# Patient Record
Sex: Female | Born: 1955 | Race: White | Hispanic: No | Marital: Married | Smoking: Never smoker
Health system: Southern US, Community
[De-identification: ages and names within clinical notes are randomized; demographics above are authoritative.]

## PROBLEM LIST (undated history)

## (undated) DIAGNOSIS — IMO0001 Reserved for inherently not codable concepts without codable children: Secondary | ICD-10-CM

## (undated) DIAGNOSIS — I10 Essential (primary) hypertension: Secondary | ICD-10-CM

## (undated) DIAGNOSIS — I739 Peripheral vascular disease, unspecified: Secondary | ICD-10-CM

## (undated) DIAGNOSIS — Z8669 Personal history of other diseases of the nervous system and sense organs: Secondary | ICD-10-CM

## (undated) DIAGNOSIS — E876 Hypokalemia: Secondary | ICD-10-CM

## (undated) DIAGNOSIS — Z794 Long term (current) use of insulin: Secondary | ICD-10-CM

## (undated) DIAGNOSIS — E785 Hyperlipidemia, unspecified: Secondary | ICD-10-CM

## (undated) DIAGNOSIS — E119 Type 2 diabetes mellitus without complications: Secondary | ICD-10-CM

## (undated) DIAGNOSIS — E1110 Type 2 diabetes mellitus with ketoacidosis without coma: Secondary | ICD-10-CM

## (undated) DIAGNOSIS — I2510 Atherosclerotic heart disease of native coronary artery without angina pectoris: Secondary | ICD-10-CM

## (undated) HISTORY — DX: Hyperlipidemia, unspecified: E78.5

## (undated) HISTORY — DX: Atherosclerotic heart disease of native coronary artery without angina pectoris: I25.10

## (undated) HISTORY — PX: EYE SURGERY: SHX253

## (undated) HISTORY — DX: Essential (primary) hypertension: I10

---

## 2005-09-18 ENCOUNTER — Ambulatory Visit: Payer: Self-pay | Admitting: Ophthalmology

## 2009-05-31 ENCOUNTER — Inpatient Hospital Stay: Payer: Self-pay | Admitting: Internal Medicine

## 2009-11-11 ENCOUNTER — Ambulatory Visit: Payer: Self-pay

## 2009-12-08 ENCOUNTER — Ambulatory Visit: Payer: Self-pay

## 2010-01-08 ENCOUNTER — Ambulatory Visit: Payer: Self-pay

## 2014-07-26 ENCOUNTER — Observation Stay
Admission: EM | Admit: 2014-07-26 | Discharge: 2014-07-28 | Disposition: A | Discharge status: Home / Self Care | Payer: BLUE CROSS/BLUE SHIELD | Attending: Internal Medicine | Admitting: Internal Medicine

## 2014-07-26 ENCOUNTER — Emergency Department: Payer: BLUE CROSS/BLUE SHIELD

## 2014-07-26 DIAGNOSIS — R079 Chest pain, unspecified: Secondary | ICD-10-CM | POA: Diagnosis present

## 2014-07-26 DIAGNOSIS — D72829 Elevated white blood cell count, unspecified: Secondary | ICD-10-CM | POA: Diagnosis not present

## 2014-07-26 DIAGNOSIS — R072 Precordial pain: Secondary | ICD-10-CM | POA: Diagnosis not present

## 2014-07-26 DIAGNOSIS — I25119 Atherosclerotic heart disease of native coronary artery with unspecified angina pectoris: Secondary | ICD-10-CM | POA: Diagnosis present

## 2014-07-26 DIAGNOSIS — Z794 Long term (current) use of insulin: Secondary | ICD-10-CM | POA: Diagnosis not present

## 2014-07-26 DIAGNOSIS — I10 Essential (primary) hypertension: Secondary | ICD-10-CM | POA: Insufficient documentation

## 2014-07-26 DIAGNOSIS — Z8249 Family history of ischemic heart disease and other diseases of the circulatory system: Secondary | ICD-10-CM | POA: Diagnosis not present

## 2014-07-26 DIAGNOSIS — M549 Dorsalgia, unspecified: Secondary | ICD-10-CM | POA: Insufficient documentation

## 2014-07-26 DIAGNOSIS — Z7982 Long term (current) use of aspirin: Secondary | ICD-10-CM | POA: Insufficient documentation

## 2014-07-26 DIAGNOSIS — Z6841 Body Mass Index (BMI) 40.0 and over, adult: Secondary | ICD-10-CM | POA: Diagnosis not present

## 2014-07-26 DIAGNOSIS — Z79899 Other long term (current) drug therapy: Secondary | ICD-10-CM | POA: Diagnosis not present

## 2014-07-26 DIAGNOSIS — R0602 Shortness of breath: Secondary | ICD-10-CM | POA: Diagnosis not present

## 2014-07-26 DIAGNOSIS — Z880 Allergy status to penicillin: Secondary | ICD-10-CM | POA: Diagnosis not present

## 2014-07-26 DIAGNOSIS — E119 Type 2 diabetes mellitus without complications: Secondary | ICD-10-CM | POA: Insufficient documentation

## 2014-07-26 DIAGNOSIS — E876 Hypokalemia: Secondary | ICD-10-CM | POA: Insufficient documentation

## 2014-07-26 DIAGNOSIS — R0789 Other chest pain: Secondary | ICD-10-CM | POA: Insufficient documentation

## 2014-07-26 HISTORY — DX: Type 2 diabetes mellitus without complications: E11.9

## 2014-07-26 HISTORY — DX: Morbid (severe) obesity due to excess calories: E66.01

## 2014-07-26 HISTORY — DX: Type 2 diabetes mellitus with ketoacidosis without coma: E11.10

## 2014-07-26 HISTORY — DX: Essential (primary) hypertension: I10

## 2014-07-26 HISTORY — DX: Hypokalemia: E87.6

## 2014-07-26 HISTORY — DX: Reserved for inherently not codable concepts without codable children: IMO0001

## 2014-07-26 HISTORY — DX: Long term (current) use of insulin: Z79.4

## 2014-07-26 LAB — COMPREHENSIVE METABOLIC PANEL
ALBUMIN: 3.6 g/dL (ref 3.5–5.0)
ALT: 12 U/L — ABNORMAL LOW (ref 14–54)
AST: 17 U/L (ref 15–41)
Alkaline Phosphatase: 58 U/L (ref 38–126)
Anion gap: 11 (ref 5–15)
BUN: 27 mg/dL — AB (ref 6–20)
CO2: 26 mmol/L (ref 22–32)
CREATININE: 1.08 mg/dL — AB (ref 0.44–1.00)
Calcium: 9.2 mg/dL (ref 8.9–10.3)
Chloride: 101 mmol/L (ref 101–111)
GFR calc Af Amer: >60 mL/min (ref >60)
GFR calc non Af Amer: 54 mL/min — ABNORMAL LOW (ref >60)
GLUCOSE: 156 mg/dL — AB (ref 65–99)
POTASSIUM: 3.4 mmol/L — AB (ref 3.5–5.1)
Sodium: 138 mmol/L (ref 135–145)
Total Bilirubin: 0.3 mg/dL (ref 0.3–1.2)
Total Protein: 7.5 g/dL (ref 6.5–8.1)

## 2014-07-26 LAB — GLUCOSE, CAPILLARY
GLUCOSE-CAPILLARY: 142 mg/dL — AB (ref 65–99)
GLUCOSE-CAPILLARY: 154 mg/dL — AB (ref 65–99)
GLUCOSE-CAPILLARY: 172 mg/dL — AB (ref 65–99)

## 2014-07-26 LAB — TROPONIN I

## 2014-07-26 LAB — CBC
HCT: 38.4 % (ref 35.0–47.0)
Hemoglobin: 12.8 g/dL (ref 12.0–16.0)
MCH: 31.9 pg (ref 26.0–34.0)
MCHC: 33.4 g/dL (ref 32.0–36.0)
MCV: 95.5 fL (ref 80.0–100.0)
Platelets: 259 10*3/uL (ref 150–440)
RBC: 4.02 MIL/uL (ref 3.80–5.20)
RDW: 12.9 % (ref 11.5–14.5)
WBC: 11.3 10*3/uL — ABNORMAL HIGH (ref 3.6–11.0)

## 2014-07-26 LAB — FIBRIN DERIVATIVES D-DIMER (ARMC ONLY): Fibrin derivatives D-dimer (ARMC): 739.81 — ABNORMAL HIGH (ref 0–499)

## 2014-07-26 MED ORDER — HYDROCHLOROTHIAZIDE 25 MG PO TABS
25.0000 mg | ORAL_TABLET | Freq: Every day | ORAL | Status: DC
  Administered 2014-07-26 – 2014-07-28 (×3): 25 mg via ORAL
  Filled 2014-07-26 (×3): qty 1

## 2014-07-26 MED ORDER — ENOXAPARIN SODIUM 120 MG/0.8ML ~~LOC~~ SOLN
120.0000 mg | Freq: Once | SUBCUTANEOUS | Status: AC
  Administered 2014-07-26: 120 mg via SUBCUTANEOUS
  Filled 2014-07-26: qty 0.8

## 2014-07-26 MED ORDER — LISINOPRIL 20 MG PO TABS
20.0000 mg | ORAL_TABLET | Freq: Every day | ORAL | Status: DC
  Administered 2014-07-26 – 2014-07-28 (×3): 20 mg via ORAL
  Filled 2014-07-26 (×3): qty 1

## 2014-07-26 MED ORDER — ROSUVASTATIN CALCIUM 10 MG PO TABS
10.0000 mg | ORAL_TABLET | Freq: Every day | ORAL | Status: DC
  Administered 2014-07-26 – 2014-07-27 (×2): 10 mg via ORAL
  Filled 2014-07-26 (×2): qty 1

## 2014-07-26 MED ORDER — IOHEXOL 350 MG/ML SOLN
100.0000 mL | Freq: Once | INTRAVENOUS | Status: AC | PRN
  Administered 2014-07-26: 100 mL via INTRAVENOUS

## 2014-07-26 MED ORDER — INSULIN ASPART 100 UNIT/ML ~~LOC~~ SOLN
0.0000 [IU] | Freq: Three times a day (TID) | SUBCUTANEOUS | Status: DC
  Administered 2014-07-26 – 2014-07-28 (×5): 2 [IU] via SUBCUTANEOUS
  Filled 2014-07-26 (×5): qty 2

## 2014-07-26 MED ORDER — ENOXAPARIN SODIUM 40 MG/0.4ML ~~LOC~~ SOLN: 40.0000 mg | SUBCUTANEOUS | Status: DC

## 2014-07-26 MED ORDER — METOPROLOL TARTRATE 50 MG PO TABS
50.0000 mg | ORAL_TABLET | Freq: Two times a day (BID) | ORAL | Status: DC
  Administered 2014-07-26 – 2014-07-28 (×5): 50 mg via ORAL
  Filled 2014-07-26 (×5): qty 1

## 2014-07-26 MED ORDER — SODIUM CHLORIDE 0.9 % IJ SOLN
3.0000 mL | Freq: Two times a day (BID) | INTRAMUSCULAR | Status: DC
  Administered 2014-07-26 – 2014-07-27 (×3): 3 mL via INTRAVENOUS

## 2014-07-26 MED ORDER — ASPIRIN 81 MG PO CHEW
324.0000 mg | CHEWABLE_TABLET | Freq: Once | ORAL | Status: AC
  Administered 2014-07-26: 324 mg via ORAL
  Filled 2014-07-26: qty 4

## 2014-07-26 MED ORDER — SENNOSIDES-DOCUSATE SODIUM 8.6-50 MG PO TABS: 1.0000 | ORAL_TABLET | Freq: Every evening | ORAL | Status: DC | PRN

## 2014-07-26 MED ORDER — NITROGLYCERIN 2 % TD OINT
1.0000 [in_us] | TOPICAL_OINTMENT | Freq: Once | TRANSDERMAL | Status: AC
  Administered 2014-07-26: 1 [in_us] via TOPICAL
  Filled 2014-07-26: qty 1

## 2014-07-26 MED ORDER — POTASSIUM CHLORIDE CRYS ER 20 MEQ PO TBCR
40.0000 meq | EXTENDED_RELEASE_TABLET | Freq: Once | ORAL | Status: AC
  Administered 2014-07-26: 40 meq via ORAL
  Filled 2014-07-26: qty 2

## 2014-07-26 MED ORDER — INSULIN GLARGINE 100 UNIT/ML ~~LOC~~ SOLN
52.0000 [IU] | Freq: Every day | SUBCUTANEOUS | Status: DC
  Administered 2014-07-26 – 2014-07-27 (×2): 52 [IU] via SUBCUTANEOUS
  Filled 2014-07-26 (×3): qty 0.52

## 2014-07-26 MED ORDER — ACETAMINOPHEN 325 MG PO TABS
650.0000 mg | ORAL_TABLET | Freq: Four times a day (QID) | ORAL | Status: DC | PRN
  Administered 2014-07-27 – 2014-07-28 (×3): 650 mg via ORAL
  Filled 2014-07-26 (×3): qty 2

## 2014-07-26 MED ORDER — LISINOPRIL-HYDROCHLOROTHIAZIDE 20-25 MG PO TABS: 1.0000 | ORAL_TABLET | Freq: Every day | ORAL | Status: DC

## 2014-07-26 MED ORDER — ACETAMINOPHEN 650 MG RE SUPP: 650.0000 mg | Freq: Four times a day (QID) | RECTAL | Status: DC | PRN

## 2014-07-26 MED ORDER — ASPIRIN EC 81 MG PO TBEC
81.0000 mg | DELAYED_RELEASE_TABLET | Freq: Every day | ORAL | Status: DC
  Administered 2014-07-27 – 2014-07-28 (×2): 81 mg via ORAL
  Filled 2014-07-26 (×2): qty 1

## 2014-07-26 MED ORDER — INSULIN ASPART 100 UNIT/ML FLEXPEN: 10.0000 [IU] | PEN_INJECTOR | Freq: Three times a day (TID) | SUBCUTANEOUS | Status: DC

## 2014-07-26 MED ORDER — ENOXAPARIN SODIUM 40 MG/0.4ML ~~LOC~~ SOLN
40.0000 mg | Freq: Two times a day (BID) | SUBCUTANEOUS | Status: DC
  Administered 2014-07-27 – 2014-07-28 (×3): 40 mg via SUBCUTANEOUS
  Filled 2014-07-26 (×3): qty 0.4

## 2014-07-26 MED ORDER — NITROGLYCERIN 0.1 MG/HR TD PT24
0.1000 mg | MEDICATED_PATCH | Freq: Every day | TRANSDERMAL | Status: DC
  Administered 2014-07-26 – 2014-07-28 (×3): 0.1 mg via TRANSDERMAL
  Filled 2014-07-26 (×3): qty 1

## 2014-07-26 MED ORDER — POTASSIUM CHLORIDE CRYS ER 10 MEQ PO TBCR
10.0000 meq | EXTENDED_RELEASE_TABLET | Freq: Every day | ORAL | Status: DC
  Administered 2014-07-27 – 2014-07-28 (×2): 10 meq via ORAL
  Filled 2014-07-26 (×2): qty 1

## 2014-07-26 NOTE — Progress Notes (Signed)
Lovenox adjustment  Per hospital protocol patient's Lovenox dose was changed to  every 12 hours due to BMI >40 (51 kg)

## 2014-07-26 NOTE — Consult Note (Signed)
Cardiology Consultation Note  Patient ID: Laura Kline, MRN: [id number], DOB/AGE: Apr 06, 1955 59 y.o. Admit date: 07/26/2014   Date of Consult: 07/26/2014 Primary Physician: No PCP Per Patient Primary Cardiologist: No cardiologist per patient, new to [facility]  Chief Complaint: Chest pain and pain between shoulder blades Reason for Consult: Chest pain and CT that showed plaque in mid LAD  HPI: 59 y.o. female with h/o hypertension controlled on lisinopril-HCTZ, IDDM managed by Dr. Carlena Sax in the [facility], history DKA in 2011, and morbid obesity presents today with complaints of intermittent chest pain occuring at rest, pain between her shoulder blades, nausea, and shortness of breath.    She has no previously known cardiac history. According to the patient, she has never had a cardiologist and has never had a cardiology work-up (stress test, Echo, cardiac cath).  CT angiochest from today (7/18) is negative for PE and aortic dissection, however does show plaque in mid LAD, therefore cardiology was consulted. Troponin negative x 2.    The patient presented to the ED from home this morning (7/18) because heart disease runs on the paternal side of her family, and she was concerned about the above symptoms. Her father suffered an MI at age 56 followed by  4 vessel CABG.   On 7/17 while watching TV she developed chest pain with associated back pain.  She says the pain was not unbearable, but she could tell it was present. Pain was not sharp or dull, just a noticeable pain. She has had the same pain a few times since they began yesterday, including a few times while being at [facility], but the episodes don't last more than 5 minutes. She has not tried any medication to help with the pain, but she attempted to get in a more comfortable position when it began and said that did help some. She had not eaten anything recently before the pain began, she hasn't had any changes in her diet, and she has not done  any extra lifting or physical work. She denies pain in her arm, diaphoresis, dizziness, vomiting, or diarrhea.  She acknowledges leg swelling and SOB, but says this is nothing new for her.  At baseline, she cannot make more than 2 trips from one room to another without getting winded.  She does not have any pain in her legs, has not traveled recently, and has not been on any long car/train/plane rides.   CXR normal. WBC 11.3, SCr 1.08, K+ 3.4, D-dimer 739. CTA chest and troponin as above.    She is currently without chest pain. She has been scheduled for [facility] by IM. We are asked to evaluate for chest pain in the setting of atherosclerosis of LAD.   EKG shows normal sinus rhythm with 82 bpm, TWI V2, nonspecific st/tchanges III    Past Medical History  Diagnosis Date  . Hypertension   . Morbid obesity   . Hypokalemia   . IDDM (insulin dependent diabetes mellitus)   . Long-term insulin use   . DKA (diabetic ketoacidoses)     a. 05/2009      Most Recent Cardiac Studies: As above   Surgical History:  Past Surgical History  Procedure Laterality Date  . Cesarean section      x2     Home Meds: Prior to Admission medications   Medication Sig Start Date End Date Taking? Authorizing Provider  aspirin EC 81 MG tablet Take 81 mg by mouth daily.   Yes Historical Provider,  MD  insulin aspart (NOVOLOG FLEXPEN) 100 UNIT/ML FlexPen Inject 10-16 Units into the skin 3 (three) times daily with meals. Per sliding scale   Yes Historical Provider, MD  Insulin Glargine (LANTUS SOLOSTAR) 100 UNIT/ML Solostar Pen Inject 52 Units into the skin at bedtime.   Yes Historical Provider, MD  lisinopril-hydrochlorothiazide (PRINZIDE,ZESTORETIC) 20-25 MG per tablet Take 1 tablet by mouth daily.   Yes Historical Provider, MD  metFORMIN (GLUCOPHAGE-XR) 500 MG 24 hr tablet Take 2 tablets by mouth daily. With dinner   Yes Historical Provider, MD  metoprolol (LOPRESSOR) 50 MG tablet Take 1 tablet by mouth 2  (two) times daily.   Yes Historical Provider, MD    Inpatient Medications:  . aspirin EC  81 mg Oral Daily  . [START ON 07/27/2014] enoxaparin (LOVENOX) injection  40 mg Subcutaneous Q12H  . lisinopril  20 mg Oral Daily   And  . hydrochlorothiazide  25 mg Oral Daily  . insulin aspart  0-9 Units Subcutaneous TID WC  . insulin glargine  52 Units Subcutaneous QHS  . metoprolol  50 mg Oral BID  . nitroGLYCERIN  0.1 mg Transdermal Daily  . rosuvastatin  10 mg Oral QPC supper  . sodium chloride  3 mL Intravenous Q12H      Allergies:  Allergies  Allergen Reactions  . Penicillins Rash    History   Social History  . Marital Status: Married    Spouse Name: N/A  . Number of Children: N/A  . Years of Education: N/A   Occupational History  . Not on file.   Social History Main Topics  . Smoking status: Never Smoker   . Smokeless tobacco: Never Used  . Alcohol Use: No  . Drug Use: No  . Sexual Activity: Yes   Other Topics Concern  . Not on file   Social History Narrative  . No narrative on file     Family History  Problem Relation Age of Onset  . Hypertension Mother   . Hypertension Father   . CAD Father     s/p cabg  . Heart attack Father   . Heart failure Father      Review of Systems: Review of Systems  Constitutional: Negative for fever, chills, weight loss, malaise/fatigue and diaphoresis.  HENT: Negative for congestion.   Eyes: Negative for blurred vision, discharge and redness.  Respiratory: Positive for shortness of breath. Negative for cough and sputum production.        SOB is not new  Cardiovascular: Positive for chest pain and leg swelling. Negative for palpitations, orthopnea and PND.       Leg swelling is bilateral and is not new  Gastrointestinal: Positive for nausea. Negative for heartburn, vomiting, abdominal pain, diarrhea and constipation.  Musculoskeletal: Positive for back pain. Negative for falls and neck pain.       Pain between shoulder  blades occurs at same time as chest pain  Skin: Negative for rash.  Neurological: Negative for dizziness, focal weakness, seizures, loss of consciousness, weakness and headaches.  Psychiatric/Behavioral: The patient is not nervous/anxious.   All other systems reviewed and are negative.   Labs:  Recent Labs  07/26/14 0735 07/26/14 1013  TROPONINI <0.03 <0.03   Lab Results  Component Value Date   WBC 11.3* 07/26/2014   HGB 12.8 07/26/2014   HCT 38.4 07/26/2014   MCV 95.5 07/26/2014   PLT 259 07/26/2014     Recent Labs Lab 07/26/14 0735  NA 138  K 3.4*  CL 101  CO2 26  BUN 27*  CREATININE 1.08*  CALCIUM 9.2  PROT 7.5  BILITOT 0.3  ALKPHOS 58  ALT 12*  AST 17  GLUCOSE 156*   No results found for: CHOL, HDL, LDLCALC, TRIG DDIMER: 739  Radiology/Studies:  Dg Chest 2 View  07/26/2014   CLINICAL DATA:  Midsternal chest pain radiating into the mid back with intermittent shortness of breath since yesterday, nonsmoker, no known cardiopulmonary history, history of diabetes and hypertension.  EXAM: CHEST  2 VIEW  COMPARISON:  Portable chest x-ray of May 31, 2009  FINDINGS: The lungs are adequately inflated and clear. The heart and mediastinal structures are normal. There is no pleural effusion. The bony thorax is unremarkable.  IMPRESSION: There is no active cardiopulmonary disease.   Electronically Signed   By: David  [place] M.D.   On: 07/26/2014 08:06   Ct Angio Chest Pe W/cm &/or Wo Cm  07/26/2014   CLINICAL DATA:  Midsternal chest pain.  Back pain.  EXAM: CT ANGIOGRAPHY CHEST WITH CONTRAST  TECHNIQUE: Multidetector CT imaging of the chest was performed using the standard protocol during bolus administration of intravenous contrast. Multiplanar CT image reconstructions and MIPs were obtained to evaluate the vascular anatomy.  CONTRAST:  OMNIPAQUE IOHEXOL 350 MG/ML SOLN  COMPARISON:  05/31/2009  FINDINGS: THORACIC INLET/BODY WALL:  No acute abnormality.  MEDIASTINUM:   Normal heart size. No pericardial effusion. Extensive atherosclerotic calcification along the mid LAD. No evidence for acute vascular abnormality including aortic dissection or pulmonary embolism (note that intermittent motion degrades evaluation of subsegmental branches, but exam is overall diagnostic for detection of clinically significant pulmonary embolism). No adenopathy.  LUNG WINDOWS:  Mosaic attenuation the lungs consistent with air trapping. No consolidation. No effusion. No suspicious pulmonary nodule.  UPPER ABDOMEN:  No acute findings.  OSSEOUS:  Diffuse degenerative disc narrowing and endplate irregularity.  Review of the MIP images confirms the above findings.  IMPRESSION: 1. No evidence of pulmonary embolism or other acute disease. 2. Coronary atherosclerosis. 3. Patchy air trapping, usually from small airways disease.   Electronically Signed   By: Marnee Spring M.D.   On: 07/26/2014 10:00    EKG: normal sinus rhythm with 82 bpm, TWI V2, nonspecific st/tchanges III    Weights: Filed Weights   07/26/14 0725  Weight: 311 lb (141.069 kg)     Physical Exam: Blood pressure 142/61, pulse 81, temperature 98.1 F (36.7 C), temperature source Oral, resp. rate 20, height 5\' 4"  (1.626 m), weight 311 lb (141.069 kg), SpO2 98 %. Body mass index is 53.36 kg/(m^2). General: Obese, well developed, well nourished, in no acute distress, sitting up in bed until she put the bed back to relieve lower back pain Head: Normocephalic, atraumatic, sclera non-icteric, no xanthomas, nares are without discharge.  Neck: Negative for carotid bruits. JVD not elevated. Lungs: Clear bilaterally to auscultation without wheezes, rales, or rhonchi. Breathing is unlabored. Heart: RRR with S1 S2. I/VI systolic murmur LUSB. No rubs or gallops appreciated. Abdomen: Obese, soft, non-tender, non-distended with normoactive bowel sounds. No hepatomegaly. No rebound/guarding. No obvious abdominal masses. Msk:  Strength and  tone appear normal for age. Extremities: No clubbing or cyanosis. No edema.  Distal pedal pulses are 2+ and equal bilaterally. Neuro: Alert and oriented X 3. No facial asymmetry. No focal deficit. Moves all extremities spontaneously. Psych:  Responds to questions appropriately with a normal affect.    Assessment and Plan:  59 y.o. female with h/o hypertension controlled  on lisinopril-HCTZ, IDDM [facility], history DKA in 2011, and morbid obesity presents today with complaints of intermittent chest pain occuring at rest, pain between her shoulder blades, nausea, and shortness of breath.    1. Chest pain: Possibly atypical given symptoms occuring at rest while watching TV. [facility] to evaluate for high risk ischemia. Will need to be 2 day study. Unable to treadmill given her deconditioning. Must consider body habitus when choosing imaging study. Consider other etiologies such as musculoskeletal or GI secondary to her risk factors of diabetes mellitus or obesity and hypertension. CT angio chest with plaque in mid LAD, monitor her overnight on telemetry and continue aspirin, beta blocker, statin, nitrate, Lovenox. Lexiscan as above. Will await results.  2. Hypokalemia: likely d/t poor diet.  Will replete and monitor BMP on 7/19  3. Elevated d-dimer: CT of the chest negative for pulmonary emboli, lower extremity ultrasound ordered, monitored by IM  4. Elevated WBC count: likely stress-induced leukocytosis, no signs of infection   5.  IDDM: continued on home meds, monitored by IM  6. Hypertension: controlled, continue her lisinopril-HCTZ and metoprolol  7. Morbid obesity: Weight loss advised    Elinor DodgeSigned, Maley Venezia, PA-C Pager: [phone] 07/26/2014, 3:52 PM

## 2014-07-26 NOTE — Plan of Care (Signed)
Problem: Phase I Progression Outcomes Goal: Other Phase I Outcomes/Goals Outcome: Completed/Met Date Met:  07/26/14 Pt is alert and oriented x 4, history of diabetes and htn controlled with po medications, pt reports that she does not have a pcp but follows closely with endocrinologist who prescribes medications. Pt lives at home with husband and son. Reports significant family history of cardiac incidents but denies having personal history of cardiac events outside of hypertension. Pt came to ED after having chest pain since 7/17, reports that is did not worsen or improve with activity. Denies recent falls, is a moderate fall risk, will likely have stress test on 7/19.

## 2014-07-26 NOTE — ED Provider Notes (Signed)
[facility] Emergency Department Provider Note     Time seen: ----------------------------------------- 7:30 AM on 07/26/2014 -----------------------------------------    I have reviewed the triage vital signs and the nursing notes.   HISTORY  Chief Complaint Chest Pain    HPI Laura Kline is a 59 y.o. female resents ER with chest pain and back pain. Patient describes it is sharp, it does not radiate from the chest to the back, just hurts in both places at the same time. She's not had a history of same, doesn't history of hypertension diabetes. Denies fevers chills, heavy physical activity. Symptom last about 5 minutes and then resolves and his very intermittent.                                Past Medical History  Diagnosis Date  . Hypertension   . Diabetes mellitus without complication     There are no active problems to display for this patient.   Past Surgical History  Procedure Laterality Date  . Cesarean section      x2    Allergies Penicillins  Social History History  Substance Use Topics  . Smoking status: Never Smoker   . Smokeless tobacco: Never Used  . Alcohol Use: No    Review of Systems Constitutional: Negative for fever. Eyes: Negative for visual changes. ENT: Negative for sore throat. Cardiovascular: Positive for chest pain Respiratory: Negative for shortness of breath. Gastrointestinal: Negative for abdominal pain, vomiting and diarrhea. Genitourinary: Negative for dysuria. Musculoskeletal: Positive for upper back pain Skin: Negative for rash. Neurological: Negative for headaches, focal weakness or numbness.  10-point ROS otherwise negative.  ____________________________________________   PHYSICAL EXAM:  VITAL SIGNS: ED Triage Vitals  Enc Vitals Group     BP 07/26/14 0721 133/49 mmHg     Pulse Rate 07/26/14 0721 84     Resp 07/26/14 0721 18     Temp 07/26/14 0721 97.8 F (36.6 C)     Temp Source  07/26/14 0721 Oral     SpO2 07/26/14 0721 96 %     Weight 07/26/14 0725 311 lb (141.069 kg)     Height 07/26/14 0721 5\' 4"  (1.626 m)     Head Cir --      Peak Flow --      Pain Score 07/26/14 0722 0     Pain Loc --      Pain Edu? --      Excl. in GC? --    Constitutional: Alert and oriented. Well appearing and in no distress. Eyes: Conjunctivae are normal. PERRL. Normal extraocular movements. ENT   Head: Normocephalic and atraumatic.   Nose: No congestion/rhinnorhea.   Mouth/Throat: Mucous membranes are moist.   Neck: No stridor. Hematological/Lymphatic/Immunilogical: No cervical lymphadenopathy. Cardiovascular: Normal rate, regular rhythm. Normal and symmetric distal pulses are present in all extremities. No murmurs, rubs, or gallops. Respiratory: Normal respiratory effort without tachypnea nor retractions. Breath sounds are clear and equal bilaterally. No wheezes/rales/rhonchi. Gastrointestinal: Soft and nontender. No distention. No abdominal bruits. There is no CVA tenderness. Musculoskeletal: Nontender with normal range of motion in all extremities. No joint effusions.  No lower extremity tenderness nor edema. Neurologic:  Normal speech and language. No gross focal neurologic deficits are appreciated. Speech is normal. No gait instability. Skin:  Skin is warm, dry and intact. No rash noted. Psychiatric: Mood and affect are normal. Speech and behavior are normal. Patient exhibits appropriate  insight and judgment. ____________________________________________  EKG: Interpreted by me. Normal sinus rhythm with a rate of 82 bpm, low voltage QRS, normal axis. No evidence of acute infarction.  ____________________________________________  ED COURSE:  Pertinent labs & imaging results that were available during my care of the patient were reviewed by me and considered in my medical decision making (see chart for details). Patient with nonspecific chest and back pain, we'll  check cardiac labs and reevaluate. ____________________________________________    LABS (pertinent positives/negatives)  Labs Reviewed  CBC - Abnormal; Notable for the following:    WBC 11.3 (*)    All other components within normal limits  COMPREHENSIVE METABOLIC PANEL - Abnormal; Notable for the following:    Potassium 3.4 (*)    Glucose, Bld 156 (*)    BUN 27 (*)    Creatinine, Ser 1.08 (*)    ALT 12 (*)    GFR calc non Af Amer 54 (*)    All other components within normal limits  FIBRIN DERIVATIVES D-DIMER ([facility] ONLY) - Abnormal; Notable for the following:    Fibrin derivatives D-dimer ([facility]) 739.81 (*)    All other components within normal limits  TROPONIN I  TROPONIN I    RADIOLOGY Images were viewed by me  Chest x-ray is unremarkable, CT reveals mid LAD plaque. No PE  ____________________________________________  FINAL ASSESSMENT AND PLAN  Chest pain, thoracic back pain  Plan: Patient labs and imaging as dictated above. I discussed the case with cardiology, they recommended inpatient stress testing. She is currently chest pain-free at this time, the pain is intermittent. Nothing seems to make it better or worse. She'll get Nitropaste Lovenox and 4 baby aspirin. Emily Filbert, MD   Emily Filbert, MD 07/26/14 [phone]

## 2014-07-26 NOTE — H&P (Signed)
[facility] - [place] at [facility]   PATIENT NAME: Laura Kline    MR#:  [id number]  DATE OF BIRTH:  1955/07/14  DATE OF ADMISSION:  07/26/2014  PRIMARY CARE PHYSICIAN: No PCP Per Patient   REQUESTING/REFERRING PHYSICIAN: Dr. Theressa Millard  CHIEF COMPLAINT:   Chief Complaint  Patient presents with  . Chest Pain    HISTORY OF PRESENT ILLNESS:  Kristianne Albin  is a 59 y.o. female with a known history of hypertension, type 2 diabetes mellitus came in because of chest pain. Patient says the chest pain is going on recently for the past few days associated with pain going back to her shoulder blades. Patient says that just his pain is in the middle of the chest and and meets the waxing and waning type of pain complains of some nausea but the no fever no aggravating or relieving factors for chest pain and the associated with some wall nausea but no diaphoresis. And she also has some shortness of breath with the chest pain. She had a CT angino chest which showed LAD plaque so admitting to observation status for stress test.  PAST MEDICAL HISTORY:   Past Medical History  Diagnosis Date  . Hypertension   . Diabetes mellitus without complication     PAST SURGICAL HISTOIRY:   Past Surgical History  Procedure Laterality Date  . Cesarean section      x2    SOCIAL HISTORY:   History  Substance Use Topics  . Smoking status: Never Smoker   . Smokeless tobacco: Never Used  . Alcohol Use: No    FAMILY HISTORY:  History reviewed. No pertinent family history. Reviewed,and updated family history DRUG ALLERGIES:   Allergies  Allergen Reactions  . Penicillins Rash    REVIEW OF SYSTEMS:  CONSTITUTIONAL: No fever, fatigue or weakness.  EYES: No blurred or double vision.  EARS, NOSE, AND THROAT: No tinnitus or ear pain.  RESPIRATORY: No cough, shortness of breath, wheezing or hemoptysis.  CARDIOVASCULAR: No chest pain, orthopnea, edema.   GASTROINTESTINAL: No nausea, vomiting, diarrhea or abdominal pain.  GENITOURINARY: No dysuria, hematuria.  ENDOCRINE: No polyuria, nocturia,  HEMATOLOGY: No anemia, easy bruising or bleeding SKIN: No rash or lesion. MUSCULOSKELETAL: No joint pain or arthritis.   NEUROLOGIC: No tingling, numbness, weakness.  PSYCHIATRY: No anxiety or depression.   MEDICATIONS AT HOME:   Prior to Admission medications   Medication Sig Start Date End Date Taking? Authorizing Provider  aspirin EC 81 MG tablet Take 81 mg by mouth daily.   Yes Historical Provider, MD  insulin aspart (NOVOLOG FLEXPEN) 100 UNIT/ML FlexPen Inject 10-16 Units into the skin 3 (three) times daily with meals. Per sliding scale   Yes Historical Provider, MD  Insulin Glargine (LANTUS SOLOSTAR) 100 UNIT/ML Solostar Pen Inject 52 Units into the skin at bedtime.   Yes Historical Provider, MD  lisinopril-hydrochlorothiazide (PRINZIDE,ZESTORETIC) 20-25 MG per tablet Take 1 tablet by mouth daily.   Yes Historical Provider, MD  metFORMIN (GLUCOPHAGE-XR) 500 MG 24 hr tablet Take 2 tablets by mouth daily. With dinner   Yes Historical Provider, MD  metoprolol (LOPRESSOR) 50 MG tablet Take 1 tablet by mouth 2 (two) times daily.   Yes Historical Provider, MD      VITAL SIGNS:  Blood pressure 136/63, pulse 72, temperature 97.8 F (36.6 C), temperature source Oral, resp. rate 14, height  (1.626 m), weight 141.069 kg (311 lb), SpO2 99 %.  PHYSICAL EXAMINATION:  GENERAL:  59  y.o.-year-old patient lying in the bed with no acute distress.  EYES: Pupils equal, round, reactive to light and accommodation. No scleral icterus. Extraocular muscles intact.  HEENT: Head atraumatic, normocephalic. Oropharynx and nasopharynx clear.  NECK:  Supple, no jugular venous distention. No thyroid enlargement, no tenderness.  LUNGS: Normal breath sounds bilaterally, no wheezing, rales,rhonchi or crepitation. No use of accessory muscles of respiration.   CARDIOVASCULAR: S1, S2 normal. No murmurs, rubs, or gallops.  ABDOMEN: Soft, nontender, nondistended. Bowel sounds present. No organomegaly or mass.  EXTREMITIES: No pedal edema, cyanosis, or clubbing.  NEUROLOGIC: Cranial nerves II through XII are intact. Muscle strength 5/5 in all extremities. Sensation intact. Gait not checked.  PSYCHIATRIC: The patient is alert and oriented x 3.  SKIN: No obvious rash, lesion, or ulcer.   LABORATORY PANEL:   CBC  Recent Labs Lab 07/26/14 0735  WBC 11.3*  HGB 12.8  HCT 38.4  PLT 259   ------------------------------------------------------------------------------------------------------------------  Chemistries   Recent Labs Lab 07/26/14 0735  NA 138  K 3.4*  CL 101  CO2 26  GLUCOSE 156*  BUN 27*  CREATININE 1.08*  CALCIUM 9.2  AST 17  ALT 12*  ALKPHOS 58  BILITOT 0.3   ------------------------------------------------------------------------------------------------------------------  Cardiac Enzymes  Recent Labs Lab 07/26/14 1013  TROPONINI <0.03   ------------------------------------------------------------------------------------------------------------------  RADIOLOGY:  Dg Chest 2 View  07/26/2014   CLINICAL DATA:  Midsternal chest pain radiating into the mid back with intermittent shortness of breath since yesterday, nonsmoker, no known cardiopulmonary history, history of diabetes and hypertension.  EXAM: CHEST  2 VIEW  COMPARISON:  Portable chest x-ray of May 31, 2009  FINDINGS: The lungs are adequately inflated and clear. The heart and mediastinal structures are normal. There is no pleural effusion. The bony thorax is unremarkable.  IMPRESSION: There is no active cardiopulmonary disease.   Electronically Signed   By: David  SwazilandJordan M.D.   On: 07/26/2014 08:06   Ct Angio Chest Pe W/cm &/or Wo Cm  07/26/2014   CLINICAL DATA:  Midsternal chest pain.  Back pain.  EXAM: CT ANGIOGRAPHY CHEST WITH CONTRAST  TECHNIQUE:  Multidetector CT imaging of the chest was performed using the standard protocol during bolus administration of intravenous contrast. Multiplanar CT image reconstructions and MIPs were obtained to evaluate the vascular anatomy.  CONTRAST:  100mL OMNIPAQUE IOHEXOL 350 MG/ML SOLN  COMPARISON:  05/31/2009  FINDINGS: THORACIC INLET/BODY WALL:  No acute abnormality.  MEDIASTINUM:  Normal heart size. No pericardial effusion. Extensive atherosclerotic calcification along the mid LAD. No evidence for acute vascular abnormality including aortic dissection or pulmonary embolism (note that intermittent motion degrades evaluation of subsegmental branches, but exam is overall diagnostic for detection of clinically significant pulmonary embolism). No adenopathy.  LUNG WINDOWS:  Mosaic attenuation the lungs consistent with air trapping. No consolidation. No effusion. No suspicious pulmonary nodule.  UPPER ABDOMEN:  No acute findings.  OSSEOUS:  Diffuse degenerative disc narrowing and endplate irregularity.  Review of the MIP images confirms the above findings.  IMPRESSION: 1. No evidence of pulmonary embolism or other acute disease. 2. Coronary atherosclerosis. 3. Patchy air trapping, usually from small airways disease.   Electronically Signed   By: Marnee SpringJonathon  Watts M.D.   On: 07/26/2014 10:00    EKG:   Orders placed or performed during the hospital encounter of 07/26/14  . ED EKG  . ED EKG  . EKG 12-Lead  . EKG 12-Lead   Shows normal sinus rhythm with 82 bpm no ST-T changes. IMPRESSION  AND PLAN:   1. Chest pain sounds atypical: Likely musculoskeletal, secondary to her risk factors of diabetes mellitus or obesity and hypertension, CT angio  chest is showing plaque in mid LAD, we will monitor her overnight on telemetry and continue aspirin, beta blockers, statins, nitrates, Lovenox. Obtain lexi scan stress test  in the morning . Two  sets of troponins are negative.  #2: Elevated d-dimer CT of the chest negative for  pulmonary emboli, check lower extremity ultrasound to evaluate for DVT.  3,Elevated white count likely stress-induced leukocytosis  #4 type 2 diabetes mellitus restart her home medications with NovoLog, Lantus., Hold Lantus secondary to CT chest with contrast.  5;Hypertension ; controlled continue her lisinopril with HCTZ, metoprolol.  All the records are reviewed and case discussed with ED provider. Management plans discussed with the patient, family and they are in agreement.  CODE STATUS: Full  TOTAL TIME TAKING CARE OF THIS PATIENT: 55 minutes.    Katha Hamming M.D on 07/26/2014 at 12:57 PM  Between 7am to 6pm - Pager - [phone]  After 6pm go to [email or web address] - password [email or web address] [facility]  [facility]  Office  [phone]  CC: Primary care physician; No PCP Per Patient

## 2014-07-26 NOTE — ED Notes (Signed)
Pt c/o chest pain that radiates in between here shoulder intermittent with SOB and nausea since last night.

## 2014-07-27 ENCOUNTER — Observation Stay: Payer: BLUE CROSS/BLUE SHIELD

## 2014-07-27 LAB — GLUCOSE, CAPILLARY
GLUCOSE-CAPILLARY: 153 mg/dL — AB (ref 65–99)
GLUCOSE-CAPILLARY: 200 mg/dL — AB (ref 65–99)
Glucose-Capillary: 157 mg/dL — ABNORMAL HIGH (ref 65–99)
Glucose-Capillary: 187 mg/dL — ABNORMAL HIGH (ref 65–99)

## 2014-07-27 LAB — COMPREHENSIVE METABOLIC PANEL
ALK PHOS: 50 U/L (ref 38–126)
ALT: 10 U/L — ABNORMAL LOW (ref 14–54)
ANION GAP: 8 (ref 5–15)
AST: 14 U/L — ABNORMAL LOW (ref 15–41)
Albumin: 3.4 g/dL — ABNORMAL LOW (ref 3.5–5.0)
BUN: 22 mg/dL — AB (ref 6–20)
CO2: 28 mmol/L (ref 22–32)
Calcium: 8.9 mg/dL (ref 8.9–10.3)
Chloride: 104 mmol/L (ref 101–111)
Creatinine, Ser: 0.92 mg/dL (ref 0.44–1.00)
Glucose, Bld: 146 mg/dL — ABNORMAL HIGH (ref 65–99)
Potassium: 3.9 mmol/L (ref 3.5–5.1)
Sodium: 140 mmol/L (ref 135–145)
Total Bilirubin: 0.5 mg/dL (ref 0.3–1.2)
Total Protein: 6.7 g/dL (ref 6.5–8.1)

## 2014-07-27 LAB — LIPID PANEL
CHOL/HDL RATIO: 4.9 ratio
Cholesterol: 156 mg/dL (ref 0–200)
HDL: 32 mg/dL — ABNORMAL LOW (ref >40)
LDL Cholesterol: 82 mg/dL (ref 0–99)
Triglycerides: 208 mg/dL — ABNORMAL HIGH (ref <150)
VLDL: 42 mg/dL — ABNORMAL HIGH (ref 0–40)

## 2014-07-27 LAB — TROPONIN I: Troponin I: <0.03 ng/mL (ref <0.031)

## 2014-07-27 MED ORDER — ROSUVASTATIN CALCIUM 10 MG PO TABS: 10.0000 mg | ORAL_TABLET | Freq: Every day | ORAL | Status: DC

## 2014-07-27 MED ORDER — REGADENOSON 0.4 MG/5ML IV SOLN
0.4000 mg | Freq: Once | INTRAVENOUS | Status: AC
  Administered 2014-07-27: 0.4 mg via INTRAVENOUS

## 2014-07-27 MED ORDER — NYSTATIN 100000 UNIT/GM EX POWD
Freq: Three times a day (TID) | CUTANEOUS | Status: DC
  Administered 2014-07-27 – 2014-07-28 (×3): via TOPICAL
  Filled 2014-07-27 (×2): qty 15

## 2014-07-27 MED ORDER — TECHNETIUM TC 99M SESTAMIBI - CARDIOLITE
32.7500 | Freq: Once | INTRAVENOUS | Status: AC | PRN
  Administered 2014-07-27: 32.75 via INTRAVENOUS

## 2014-07-27 NOTE — Discharge Instructions (Signed)
Low sodium, low fat and ADA diet. °Activity as tolerated. °

## 2014-07-27 NOTE — Progress Notes (Signed)
[facility] - [place] at [facility]   PATIENT NAME: Laura Kline    MR#:  [id number]  DATE OF BIRTH:  [birth year]  SUBJECTIVE:  CHIEF COMPLAINT:   Chief Complaint  Patient presents with  . Chest Pain   No complaint REVIEW OF SYSTEMS:  CONSTITUTIONAL: No fever, fatigue or weakness.  EYES: No blurred or double vision.  EARS, NOSE, AND THROAT: No tinnitus or ear pain.  RESPIRATORY: No cough, shortness of breath, wheezing or hemoptysis.  CARDIOVASCULAR: No chest pain, orthopnea, edema.  GASTROINTESTINAL: No nausea, vomiting, diarrhea or abdominal pain.  GENITOURINARY: No dysuria, hematuria.  ENDOCRINE: No polyuria, nocturia,  HEMATOLOGY: No anemia, easy bruising or bleeding SKIN: No rash or lesion. MUSCULOSKELETAL: No joint pain or arthritis.   NEUROLOGIC: No tingling, numbness, weakness.  PSYCHIATRY: No anxiety or depression.   DRUG ALLERGIES:   Allergies  Allergen Reactions  . Penicillins Rash    VITALS:  Blood pressure 118/63, pulse 82, temperature 98.1 F (36.7 C), temperature source Oral, resp. rate 20, height  (1.6 m), weight 139.39 kg (307 lb 4.8 oz), SpO2 98 %.  PHYSICAL EXAMINATION:  GENERAL:  59 y.o.-year-old patient lying in the bed with no acute distress. Obese obese EYES: Pupils equal, round, reactive to light and accommodation. No scleral icterus. Extraocular muscles intact.  HEENT: Head atraumatic, normocephalic. Oropharynx and nasopharynx clear.  NECK:  Supple, no jugular venous distention. No thyroid enlargement, no tenderness.  LUNGS: Normal breath sounds bilaterally, no wheezing, rales,rhonchi or crepitation. No use of accessory muscles of respiration.  CARDIOVASCULAR: S1, S2 normal. No murmurs, rubs, or gallops.  ABDOMEN: Soft, nontender, nondistended. Bowel sounds present. No organomegaly or mass.  EXTREMITIES: No pedal edema, cyanosis, or clubbing.  NEUROLOGIC: Cranial nerves II through XII are intact. Muscle strength  5/5 in all extremities. Sensation intact. Gait not checked.  PSYCHIATRIC: The patient is alert and oriented x 3.  SKIN: No obvious rash, lesion, or ulcer.    LABORATORY PANEL:   CBC  Recent Labs Lab 07/26/14 0735  WBC 11.3*  HGB 12.8  HCT 38.4  PLT 259   ------------------------------------------------------------------------------------------------------------------  Chemistries   Recent Labs Lab 07/27/14 0421  NA 140  K 3.9  CL 104  CO2 28  GLUCOSE 146*  BUN 22*  CREATININE 0.92  CALCIUM 8.9  AST 14*  ALT 10*  ALKPHOS 50  BILITOT 0.5   ------------------------------------------------------------------------------------------------------------------  Cardiac Enzymes  Recent Labs Lab 07/27/14 0807  TROPONINI <0.03   ------------------------------------------------------------------------------------------------------------------  RADIOLOGY:  Dg Chest 2 View  07/26/2014   CLINICAL DATA:  Midsternal chest pain radiating into the mid back with intermittent shortness of breath since yesterday, nonsmoker, no known cardiopulmonary history, history of diabetes and hypertension.  EXAM: CHEST  2 VIEW  COMPARISON:  Portable chest x-ray of May 31, 2009  FINDINGS: The lungs are adequately inflated and clear. The heart and mediastinal structures are normal. There is no pleural effusion. The bony thorax is unremarkable.  IMPRESSION: There is no active cardiopulmonary disease.   Electronically Signed   By: David  Swaziland M.D.   On: 07/26/2014 08:06   Ct Angio Chest Pe W/cm &/or Wo Cm  07/26/2014   CLINICAL DATA:  Midsternal chest pain.  Back pain.  EXAM: CT ANGIOGRAPHY CHEST WITH CONTRAST  TECHNIQUE: Multidetector CT imaging of the chest was performed using the standard protocol during bolus administration of intravenous contrast. Multiplanar CT image reconstructions and MIPs were obtained to evaluate the vascular anatomy.  CONTRAST:   OMNIPAQUE IOHEXOL 350 MG/ML SOLN   COMPARISON:  05/31/2009  FINDINGS: THORACIC INLET/BODY WALL:  No acute abnormality.  MEDIASTINUM:  Normal heart size. No pericardial effusion. Extensive atherosclerotic calcification along the mid LAD. No evidence for acute vascular abnormality including aortic dissection or pulmonary embolism (note that intermittent motion degrades evaluation of subsegmental branches, but exam is overall diagnostic for detection of clinically significant pulmonary embolism). No adenopathy.  LUNG WINDOWS:  Mosaic attenuation the lungs consistent with air trapping. No consolidation. No effusion. No suspicious pulmonary nodule.  UPPER ABDOMEN:  No acute findings.  OSSEOUS:  Diffuse degenerative disc narrowing and endplate irregularity.  Review of the MIP images confirms the above findings.  IMPRESSION: 1. No evidence of pulmonary embolism or other acute disease. 2. Coronary atherosclerosis. 3. Patchy air trapping, usually from small airways disease.   Electronically Signed   By: Marnee SpringJonathon  Watts M.D.   On: 07/26/2014 10:00    EKG:   Orders placed or performed during the hospital encounter of 07/26/14  . ED EKG  . ED EKG  . EKG 12-Lead  . EKG 12-Lead    ASSESSMENT AND PLAN:   1. Chest pain sounds atypical: continue aspirin, beta blockers, statins, nitrates, Lovenox. F/u lexi scan stress test and Dr. Kirke CorinArida.   2.  Elevated d-dimer.  CT of the chest negative for pulmonary emboli, check lower extremity ultrasound to evaluate for DVT.  3. type 2 diabetes mellitus, continue Lantus and SL.  4.Hypertension.  controlled, continue lisinopril with HCTZ, metoprolol.     All the records are reviewed and case discussed with Care Management/Social Workerr. Management plans discussed with the patient, family and they are in agreement.  CODE STATUS: Full code  TOTAL TIME TAKING CARE OF THIS PATIENT: 38 minutes.   POSSIBLE D/C IN 1 DAYS, DEPENDING ON CLINICAL CONDITION.   Shaune Pollackhen, Amor Packard M.D on 07/27/2014 at 2:48  PM  Between 7am to 6pm - Pager - [phone]  After 6pm go to [email or web address] - password [email or web address] [facility]  [facility]  Office  [phone]  CC: Primary care physician; No PCP Per Patient

## 2014-07-27 NOTE — Progress Notes (Signed)
    Patient underwent Lexiscan Myoview this morning. No acute EKG changes. Await stress Myoview images. If stress images are unremarkable patient could be discharged. If abnormal, will need resting images on 7/20.    Eula Listenyan Rudi Bunyard, PA-C 07/27/2014 10:01 AM

## 2014-07-27 NOTE — Progress Notes (Signed)
Per MD, okay to order nystain powder for excoriated abdominal folds. Verbal order via Dr. Imogene Burnhen.

## 2014-07-27 NOTE — Progress Notes (Signed)
    Patient had concerns regarding 2-day nuc. Explained to patient procedure of stress test again. She understands and is willing to undergo stress test and stay for cath if needed.   Eula Listenyan Keane Martelli, PA-C 07/27/2014 8:56 AM

## 2014-07-27 NOTE — Plan of Care (Signed)
Problem: Phase II Progression Outcomes Goal: Other Phase II Outcomes/Goals Outcome: Progressing Pt is alert and oriented x 4, up to bathroom with one assist, bm throughout shift, good appetite,denies chest pain, 3rd troponin remains negative, stress test performed in am, awaiting on results. Vital signs remain stable, on room air, care will be taken over by different nurse at 15:00.

## 2014-07-27 NOTE — Care Management (Signed)
Patient is going to require a 2 day stress due to size.  She will remain under observation unless something changes

## 2014-07-28 ENCOUNTER — Telehealth: Payer: Self-pay

## 2014-07-28 ENCOUNTER — Observation Stay: Payer: BLUE CROSS/BLUE SHIELD

## 2014-07-28 DIAGNOSIS — R079 Chest pain, unspecified: Secondary | ICD-10-CM

## 2014-07-28 DIAGNOSIS — R0789 Other chest pain: Secondary | ICD-10-CM

## 2014-07-28 LAB — GLUCOSE, CAPILLARY
GLUCOSE-CAPILLARY: 163 mg/dL — AB (ref 65–99)
Glucose-Capillary: 191 mg/dL — ABNORMAL HIGH (ref 65–99)

## 2014-07-28 MED ORDER — ROSUVASTATIN CALCIUM 10 MG PO TABS: 10.0000 mg | ORAL_TABLET | Freq: Every day | ORAL | Status: DC

## 2014-07-28 MED ORDER — TECHNETIUM TC 99M SESTAMIBI - CARDIOLITE
32.0910 | Freq: Once | INTRAVENOUS | Status: AC | PRN
  Administered 2014-07-28: 32.091 via INTRAVENOUS

## 2014-07-28 NOTE — Telephone Encounter (Signed)
[facility].  Patient understands to follow up with provider Arida on 8/19 at 2:30pm at [facility] office. Patient understands discharge instructions?  Patient understands medications and regiment?  Patient understands to bring all medications to this visit?   Left message with information and call back number if any questions.

## 2014-07-28 NOTE — Discharge Summary (Signed)
[facility] - [place] at [facility]   PATIENT NAME: Midge MiniumSabrina Ditmer    MR#:  [id number]  DATE OF BIRTH:  07/11/1955  DATE OF ADMISSION:  07/26/2014 ADMITTING PHYSICIAN: Katha HammingSnehalatha Konidena, MD  DATE OF DISCHARGE: 07/28/2014  PRIMARY CARE PHYSICIAN: No PCP Per Patient    ADMISSION DIAGNOSIS:  Chest pain, unspecified chest pain type [R07.9] Coronary artery disease involving native coronary artery of native heart with angina pectoris [I25.119]   DISCHARGE DIAGNOSIS:  Atypical Chest pain   SECONDARY DIAGNOSIS:   Past Medical History  Diagnosis Date  . Hypertension   . Morbid obesity   . Hypokalemia   . IDDM (insulin dependent diabetes mellitus)   . Long-term insulin use   . DKA (diabetic ketoacidoses)     a. 05/2009    HOSPITAL COURSE:   1. Atypical Chest pain. She has been treated with aspirin, beta blockers, statins, nitrates, Lovenox. Negative stress test.    2. Elevated d-dimer. CT of the chest negative for pulmonary emboli.   3. type 2 diabetes mellitus, continue Lantus and SL.  4.Hypertension. controlled, continue lisinopril with HCTZ, metoprolol.  DISCHARGE CONDITIONS:   Stable.  CONSULTS OBTAINED:  Treatment Team:  Iran OuchMuhammad A Arida, MD Phil DoppMuammar A Arida, MD  DRUG ALLERGIES:   Allergies  Allergen Reactions  . Penicillins Rash    DISCHARGE MEDICATIONS:   Current Discharge Medication List    START taking these medications   Details  rosuvastatin (CRESTOR) 10 MG tablet Take 1 tablet (10 mg total) by mouth daily after supper. Qty: 30 tablet, Refills: 0      CONTINUE these medications which have NOT CHANGED   Details  aspirin EC 81 MG tablet Take 81 mg by mouth daily.    insulin aspart (NOVOLOG FLEXPEN) 100 UNIT/ML FlexPen Inject 10-16 Units into the skin 3 (three) times daily with meals. Per sliding scale    Insulin Glargine (LANTUS SOLOSTAR) 100 UNIT/ML Solostar Pen Inject 52 Units into the skin at bedtime.     lisinopril-hydrochlorothiazide (PRINZIDE,ZESTORETIC) 20-25 MG per tablet Take 1 tablet by mouth daily.    metFORMIN (GLUCOPHAGE-XR) 500 MG 24 hr tablet Take 2 tablets by mouth daily. With dinner    metoprolol (LOPRESSOR) 50 MG tablet Take 1 tablet by mouth 2 (two) times daily.         DISCHARGE INSTRUCTIONS:    If you experience worsening of your admission symptoms, develop shortness of breath, life threatening emergency, suicidal or homicidal thoughts you must seek medical attention immediately by calling 911 or calling your MD immediately  if symptoms less severe.  You Must read complete instructions/literature along with all the possible adverse reactions/side effects for all the Medicines you take and that have been prescribed to you. Take any new Medicines after you have completely understood and accept all the possible adverse reactions/side effects.   Please note  You were cared for by a hospitalist during your hospital stay. If you have any questions about your discharge medications or the care you received while you were in the hospital after you are discharged, you can call the unit and asked to speak with the hospitalist on call if the hospitalist that took care of you is not available. Once you are discharged, your primary care physician will handle any further medical issues. Please note that NO REFILLS for any discharge medications will be authorized once you are discharged, as it is imperative that you return to your primary care physician (or establish a relationship with  a primary care physician if you do not have one) for your aftercare needs so that they can reassess your need for medications and monitor your lab values.    Today   SUBJECTIVE   No complaint.   VITAL SIGNS:  Blood pressure 113/83, pulse 76, temperature 97.9 F (36.6 C), temperature source Oral, resp. rate 18, height  (1.6 m), weight 139.39 kg (307 lb 4.8 oz), SpO2 98 %.  I/O:    Intake/Output Summary (Last 24 hours) at 07/28/14 1453 Last data filed at 07/28/14 1404  Gross per 24 hour  Intake    480 ml  Output   1250 ml  Net   -770 ml    PHYSICAL EXAMINATION:  GENERAL:  59 y.o.-year-old patient lying in the bed with no acute distress. Morbid obesity. EYES: Pupils equal, round, reactive to light and accommodation. No scleral icterus. Extraocular muscles intact.  HEENT: Head atraumatic, normocephalic. Oropharynx and nasopharynx clear.  NECK:  Supple, no jugular venous distention. No thyroid enlargement, no tenderness.  LUNGS: Normal breath sounds bilaterally, no wheezing, rales,rhonchi or crepitation. No use of accessory muscles of respiration.  CARDIOVASCULAR: S1, S2 normal. No murmurs, rubs, or gallops.  ABDOMEN: Soft, non-tender, non-distended. Bowel sounds present. No organomegaly or mass.  EXTREMITIES: No pedal edema, cyanosis, or clubbing. No calf tenderness. NEUROLOGIC: Cranial nerves II through XII are intact. Muscle strength 5/5 in all extremities. Sensation intact. Gait not checked.  PSYCHIATRIC: The patient is alert and oriented x 3.  SKIN: No obvious rash, lesion, or ulcer.   DATA REVIEW:   CBC  Recent Labs Lab 07/26/14 0735  WBC 11.3*  HGB 12.8  HCT 38.4  PLT 259    Chemistries   Recent Labs Lab 07/27/14 0421  NA 140  K 3.9  CL 104  CO2 28  GLUCOSE 146*  BUN 22*  CREATININE 0.92  CALCIUM 8.9  AST 14*  ALT 10*  ALKPHOS 50  BILITOT 0.5    Cardiac Enzymes  Recent Labs Lab 07/27/14 0807  TROPONINI <0.03    Microbiology Results  No results found for this or any previous visit.  RADIOLOGY:  No results found.      Management plans discussed with the patient, family and they are in agreement.  CODE STATUS:     Code Status Orders        Start     Ordered   07/26/14 1218  Full code   Continuous     07/26/14 1221      TOTAL TIME TAKING CARE OF THIS PATIENT: 36 minutes.    Shaune Pollack M.D on 07/28/2014  at 2:53 PM  Between 7am to 6pm - Pager - [phone]  After 6pm go to [email or web address] - password [email or web address] [facility]  [place]  Hospitalists  Office  [phone]  CC: Primary care physician; No PCP Per Patient

## 2014-08-02 ENCOUNTER — Ambulatory Visit (INDEPENDENT_AMBULATORY_CARE_PROVIDER_SITE_OTHER): Payer: BLUE CROSS/BLUE SHIELD | Admitting: Cardiovascular Disease

## 2014-08-02 ENCOUNTER — Encounter: Payer: Self-pay | Admitting: Cardiovascular Disease

## 2014-08-02 VITALS — BP 110/62 | HR 75 | Ht 64.0 in | Wt 306.2 lb

## 2014-08-02 DIAGNOSIS — E785 Hyperlipidemia, unspecified: Secondary | ICD-10-CM

## 2014-08-02 DIAGNOSIS — I2510 Atherosclerotic heart disease of native coronary artery without angina pectoris: Secondary | ICD-10-CM

## 2014-08-02 DIAGNOSIS — I10 Essential (primary) hypertension: Secondary | ICD-10-CM | POA: Diagnosis not present

## 2014-08-02 NOTE — Assessment & Plan Note (Signed)
Lab Results  Component Value Date   CHOL 156 07/27/2014   HDL 32* 07/27/2014   LDLCALC 82 07/27/2014   TRIG 208* 07/27/2014   CHOLHDL 4.9 07/27/2014   Triglyceride was mildly elevated but LDL was normal. HDL was low at 32. Given that she is diabetic and has evidence of atherosclerosis on CT scan, I agree with treatment with a statin. The patient was started on rosuvastatin but has not filled the prescription yet. She is going to do so today. Check fasting lipid and liver profile in one month.

## 2014-08-02 NOTE — Progress Notes (Signed)
HPI  This is a 59 year old female who is here today for follow-up visit after recent hospitalization at [facility]. She has no primary care physician but follows with Dr. Tedd Sias in endocrinology for management of diabetes. She has prolonged history of diabetes, hypertension and obesity. She was hospitalized last week at [facility] for chest pain radiating to her back. It was described as an ache and happened at rest. There was no other associated symptoms. Cardiac enzymes were negative. D-dimer was elevated. She underwent CTA of the chest which was negative for pulmonary embolism or dissection. However, it did show plaque in the mid LAD. She underwent a pharmacologic nuclear stress test which showed no evidence of ischemia with normal ejection fraction. She reports no further symptoms.  Allergies  Allergen Reactions  . Rofecoxib Other (See Comments)  . Penicillins Rash     Current Outpatient Prescriptions on File Prior to Visit  Medication Sig Dispense Refill  . aspirin EC 81 MG tablet Take 81 mg by mouth daily.    . insulin aspart (NOVOLOG FLEXPEN) 100 UNIT/ML FlexPen Inject 10-16 Units into the skin 3 (three) times daily with meals. Per sliding scale    . Insulin Glargine (LANTUS SOLOSTAR) 100 UNIT/ML Solostar Pen Inject 52 Units into the skin at bedtime.    Marland Kitchen lisinopril-hydrochlorothiazide (PRINZIDE,ZESTORETIC) 20-25 MG per tablet Take 1 tablet by mouth daily.    . metFORMIN (GLUCOPHAGE-XR) 500 MG 24 hr tablet Take 2 tablets by mouth daily. With dinner    . metoprolol (LOPRESSOR) 50 MG tablet Take 1 tablet by mouth 2 (two) times daily.    . rosuvastatin (CRESTOR) 10 MG tablet Take 1 tablet (10 mg total) by mouth daily after supper. 30 tablet 0   No current facility-administered medications on file prior to visit.     Past Medical History  Diagnosis Date  . Hypertension   . Morbid obesity   . Hypokalemia   . IDDM (insulin dependent diabetes mellitus)   . Long-term insulin use   . DKA  (diabetic ketoacidoses)     a. 05/2009  . Hyperlipidemia   . Essential hypertension   . Coronary atherosclerosis     LAD calcifications noted on CT scan in 2016 with negative nuclear stress test.     Past Surgical History  Procedure Laterality Date  . Cesarean section      x2     Family History  Problem Relation Age of Onset  . Hypertension Mother   . Hypertension Father   . CAD Father     s/p cabg  . Heart attack Father   . Heart failure Father      History   Social History  . Marital Status: Married    Spouse Name: N/A  . Number of Children: N/A  . Years of Education: N/A   Occupational History  . Not on file.   Social History Main Topics  . Smoking status: Never Smoker   . Smokeless tobacco: Never Used  . Alcohol Use: No  . Drug Use: No  . Sexual Activity: Yes   Other Topics Concern  . Not on file   Social History Narrative     ROS A 10 point review of system was performed. It is negative other than that mentioned in the history of present illness.   PHYSICAL EXAM   BP 110/62 mmHg  Pulse 75  Ht 5\' 4"  (1.626 m)  Wt 306 lb 4 oz (138.914 kg)  BMI 52.54 kg/m2 Constitutional: She is oriented  to person, place, and time. She appears well-developed and well-nourished. No distress.  HENT: No nasal discharge.  Head: Normocephalic and atraumatic.  Eyes: Pupils are equal and round. No discharge.  Neck: Normal range of motion. Neck supple. No JVD present. No thyromegaly present.  Cardiovascular: Normal rate, regular rhythm, normal heart sounds. Exam reveals no gallop and no friction rub. There is 1 out of 6 systolic ejection murmur in the pulmonic area. Pulmonary/Chest: Effort normal and breath sounds normal. No stridor. No respiratory distress. She has no wheezes. She has no rales. She exhibits no tenderness.  Abdominal: Soft. Bowel sounds are normal. She exhibits no distension. There is no tenderness. There is no rebound and no guarding.    Musculoskeletal: Normal range of motion. She exhibits no edema and no tenderness.  Neurological: She is alert and oriented to person, place, and time. Coordination normal.  Skin: Skin is warm and dry. No rash noted. She is not diaphoretic. No erythema. No pallor.  Psychiatric: She has a normal mood and affect. Her behavior is normal. Judgment and thought content normal.       ASSESSMENT AND PLAN

## 2014-08-02 NOTE — Assessment & Plan Note (Addendum)
Recent chest pain was atypical and does not seem to be anginal. Nuclear stress test showed no evidence of ischemia with normal ejection fraction. However, CT scan already established some atherosclerosis involving the left anterior descending artery. Thus, I recommend aggressive treatment of risk factors. I recommend continuing low dose aspirin once daily. I discussed with her the importance of lifestyle changes including healthy diet, exercise and weight loss. I advised her to establish with a primary care physician. She can follow-up with me as needed.

## 2014-08-02 NOTE — Patient Instructions (Addendum)
Medication Instructions:  Your physician recommends that you continue on your current medications as directed. Please refer to the Current Medication list given to you today.   Labwork: Your physician recommends that you return for a FASTING lipid and liver in ONE MONTH. Nothing to eat or drink after midnight the evening before your labs.    Testing/Procedures: none  Follow-Up: Your physician recommends that you schedule a follow-up appointment as needed with Dr. Kirke Corin.    Any Other Special Instructions Will Be Listed Below (If Applicable).

## 2014-08-02 NOTE — Assessment & Plan Note (Signed)
Blood pressure is well controlled on current medications. 

## 2014-08-17 LAB — NM MYOCAR MULTI W/SPECT W/WALL MOTION / EF
CHL CUP NUCLEAR SSS: 7
CSEPHR: 54 %
LV dias vol: 83 mL
LVSYSVOL: 31 mL
Peak HR: 87 {beats}/min
Rest HR: 71 {beats}/min
SDS: 7
SRS: 0
TID: 1.08

## 2014-08-27 ENCOUNTER — Encounter: Payer: BLUE CROSS/BLUE SHIELD | Admitting: Cardiovascular Disease

## 2014-09-02 ENCOUNTER — Other Ambulatory Visit (INDEPENDENT_AMBULATORY_CARE_PROVIDER_SITE_OTHER): Payer: BLUE CROSS/BLUE SHIELD

## 2014-09-02 DIAGNOSIS — E785 Hyperlipidemia, unspecified: Secondary | ICD-10-CM | POA: Diagnosis not present

## 2014-09-03 LAB — HEPATIC FUNCTION PANEL
ALK PHOS: 58 IU/L (ref 39–117)
ALT: 10 IU/L (ref 0–32)
AST: 12 IU/L (ref 0–40)
Albumin: 4 g/dL (ref 3.6–4.8)
Bilirubin Total: <0.2 mg/dL (ref 0.0–1.2)
Bilirubin, Direct: 0.08 mg/dL (ref 0.00–0.40)
Total Protein: 6.6 g/dL (ref 6.0–8.5)

## 2014-09-03 LAB — LIPID PANEL
Chol/HDL Ratio: 5.2 ratio units — ABNORMAL HIGH (ref 0.0–4.4)
Cholesterol, Total: 214 mg/dL — ABNORMAL HIGH (ref 100–199)
HDL: 41 mg/dL (ref >39)
LDL Calculated: 125 mg/dL — ABNORMAL HIGH (ref 0–99)
TRIGLYCERIDES: 242 mg/dL — AB (ref 0–149)
VLDL Cholesterol Cal: 48 mg/dL — ABNORMAL HIGH (ref 5–40)

## 2014-10-07 ENCOUNTER — Encounter: Payer: Self-pay | Admitting: *Deleted

## 2014-10-07 ENCOUNTER — Other Ambulatory Visit: Payer: Self-pay | Admitting: *Deleted

## 2014-10-07 ENCOUNTER — Telehealth: Payer: Self-pay | Admitting: Cardiovascular Disease

## 2014-10-07 MED ORDER — METOPROLOL TARTRATE 50 MG PO TABS: 50.0000 mg | ORAL_TABLET | Freq: Two times a day (BID) | ORAL | Status: AC

## 2014-10-07 MED ORDER — ROSUVASTATIN CALCIUM 10 MG PO TABS: 10.0000 mg | ORAL_TABLET | Freq: Every day | ORAL | Status: AC

## 2014-10-07 MED ORDER — LISINOPRIL-HYDROCHLOROTHIAZIDE 20-25 MG PO TABS: 1.0000 | ORAL_TABLET | Freq: Every day | ORAL | Status: DC

## 2014-10-07 NOTE — Telephone Encounter (Signed)
Pharmacy has been changed to [facility] per pt.

## 2014-10-07 NOTE — Telephone Encounter (Signed)
[facility] in refill box.  Patient insurance requires them to use.  Please add as new pharmacy.

## 2015-02-07 ENCOUNTER — Encounter: Payer: Self-pay | Admitting: *Deleted

## 2015-02-07 NOTE — Pre-Procedure Instructions (Signed)
Willaim Sheng at [facility] notified of allergy contraindication to ophthalmic compound gel and stated to proceed with orders.

## 2015-02-08 ENCOUNTER — Encounter: Payer: Self-pay | Admitting: *Deleted

## 2015-02-08 ENCOUNTER — Ambulatory Visit
Admission: RE | Admit: 2015-02-08 | Discharge: 2015-02-08 | Disposition: A | Discharge status: Home / Self Care | Payer: BLUE CROSS/BLUE SHIELD | Source: Ambulatory Visit | Attending: Ophthalmology | Admitting: Ophthalmology

## 2015-02-08 ENCOUNTER — Ambulatory Visit: Payer: BLUE CROSS/BLUE SHIELD | Admitting: Anesthesiology

## 2015-02-08 ENCOUNTER — Encounter
Admission: RE | Disposition: A | Discharge status: Home / Self Care | Payer: Self-pay | Source: Ambulatory Visit | Attending: Ophthalmology

## 2015-02-08 DIAGNOSIS — Z9841 Cataract extraction status, right eye: Secondary | ICD-10-CM | POA: Diagnosis not present

## 2015-02-08 DIAGNOSIS — H2512 Age-related nuclear cataract, left eye: Secondary | ICD-10-CM | POA: Insufficient documentation

## 2015-02-08 DIAGNOSIS — Z8669 Personal history of other diseases of the nervous system and sense organs: Secondary | ICD-10-CM | POA: Diagnosis not present

## 2015-02-08 DIAGNOSIS — I10 Essential (primary) hypertension: Secondary | ICD-10-CM | POA: Diagnosis not present

## 2015-02-08 DIAGNOSIS — E7800 Pure hypercholesterolemia, unspecified: Secondary | ICD-10-CM | POA: Diagnosis not present

## 2015-02-08 DIAGNOSIS — M7989 Other specified soft tissue disorders: Secondary | ICD-10-CM | POA: Diagnosis not present

## 2015-02-08 DIAGNOSIS — Z6841 Body Mass Index (BMI) 40.0 and over, adult: Secondary | ICD-10-CM | POA: Diagnosis not present

## 2015-02-08 DIAGNOSIS — E119 Type 2 diabetes mellitus without complications: Secondary | ICD-10-CM | POA: Diagnosis not present

## 2015-02-08 DIAGNOSIS — Z880 Allergy status to penicillin: Secondary | ICD-10-CM | POA: Insufficient documentation

## 2015-02-08 DIAGNOSIS — I739 Peripheral vascular disease, unspecified: Secondary | ICD-10-CM | POA: Insufficient documentation

## 2015-02-08 DIAGNOSIS — I7090 Unspecified atherosclerosis: Secondary | ICD-10-CM | POA: Diagnosis not present

## 2015-02-08 HISTORY — DX: Peripheral vascular disease, unspecified: I73.9

## 2015-02-08 HISTORY — PX: CATARACT EXTRACTION W/PHACO: SHX586

## 2015-02-08 HISTORY — DX: Personal history of other diseases of the nervous system and sense organs: Z86.69

## 2015-02-08 LAB — GLUCOSE, CAPILLARY: Glucose-Capillary: 255 mg/dL — ABNORMAL HIGH (ref 65–99)

## 2015-02-08 SURGERY — PHACOEMULSIFICATION, CATARACT, WITH IOL INSERTION
Anesthesia: Monitor Anesthesia Care | Site: Eye | Laterality: Left | Wound class: Clean

## 2015-02-08 MED ORDER — ONDANSETRON HCL 4 MG/2ML IJ SOLN: 4.0000 mg | Freq: Once | INTRAMUSCULAR | Status: DC | PRN

## 2015-02-08 MED ORDER — POVIDONE-IODINE 5 % OP SOLN
1.0000 "application " | Freq: Once | OPHTHALMIC | Status: AC
  Administered 2015-02-08: 1 via OPHTHALMIC

## 2015-02-08 MED ORDER — MOXIFLOXACIN HCL 0.5 % OP SOLN
OPHTHALMIC | Status: AC
  Filled 2015-02-08: qty 3

## 2015-02-08 MED ORDER — CARBACHOL 0.01 % IO SOLN
INTRAOCULAR | Status: DC | PRN
  Administered 2015-02-08: .5 mL via INTRAOCULAR

## 2015-02-08 MED ORDER — ACETAMINOPHEN 500 MG PO TABS
1000.0000 mg | ORAL_TABLET | Freq: Four times a day (QID) | ORAL | Status: DC | PRN
  Administered 2015-02-08: 1000 mg via ORAL

## 2015-02-08 MED ORDER — MOXIFLOXACIN HCL 0.5 % OP SOLN
OPHTHALMIC | Status: DC | PRN
  Administered 2015-02-08: 1 [drp] via OPHTHALMIC

## 2015-02-08 MED ORDER — ARMC OPHTHALMIC DILATING GEL
1.0000 "application " | Freq: Once | OPHTHALMIC | Status: AC
  Administered 2015-02-08: 1 via OPHTHALMIC

## 2015-02-08 MED ORDER — EPINEPHRINE HCL 1 MG/ML IJ SOLN
INTRAMUSCULAR | Status: AC
  Filled 2015-02-08: qty 1

## 2015-02-08 MED ORDER — POVIDONE-IODINE 5 % OP SOLN
OPHTHALMIC | Status: AC
  Filled 2015-02-08: qty 30

## 2015-02-08 MED ORDER — FENTANYL CITRATE (PF) 100 MCG/2ML IJ SOLN
INTRAMUSCULAR | Status: DC | PRN
  Administered 2015-02-08: 50 ug via INTRAVENOUS

## 2015-02-08 MED ORDER — NA CHONDROIT SULF-NA HYALURON 40-17 MG/ML IO SOLN
INTRAOCULAR | Status: AC
  Filled 2015-02-08: qty 1

## 2015-02-08 MED ORDER — SODIUM CHLORIDE 0.9 % IV SOLN
INTRAVENOUS | Status: DC
  Administered 2015-02-08 (×2): via INTRAVENOUS

## 2015-02-08 MED ORDER — MIDAZOLAM HCL 2 MG/2ML IJ SOLN
INTRAMUSCULAR | Status: DC | PRN
  Administered 2015-02-08 (×2): 1 mg via INTRAVENOUS

## 2015-02-08 MED ORDER — MOXIFLOXACIN HCL 0.5 % OP SOLN: 1.0000 [drp] | OPHTHALMIC | Status: DC | PRN

## 2015-02-08 MED ORDER — NA CHONDROIT SULF-NA HYALURON 40-17 MG/ML IO SOLN
INTRAOCULAR | Status: DC | PRN
  Administered 2015-02-08: 1 mL via INTRAOCULAR

## 2015-02-08 MED ORDER — ARMC OPHTHALMIC DILATING GEL
OPHTHALMIC | Status: AC
  Filled 2015-02-08: qty 0.25

## 2015-02-08 MED ORDER — ACETAMINOPHEN 500 MG PO TABS
ORAL_TABLET | ORAL | Status: AC
  Administered 2015-02-08: 1000 mg via ORAL
  Filled 2015-02-08: qty 2

## 2015-02-08 MED ORDER — FENTANYL CITRATE (PF) 100 MCG/2ML IJ SOLN: 25.0000 ug | INTRAMUSCULAR | Status: DC | PRN

## 2015-02-08 MED ORDER — TETRACAINE HCL 0.5 % OP SOLN
OPHTHALMIC | Status: AC
  Filled 2015-02-08: qty 2

## 2015-02-08 MED ORDER — TETRACAINE HCL 0.5 % OP SOLN
1.0000 [drp] | Freq: Once | OPHTHALMIC | Status: AC
  Administered 2015-02-08: 1 [drp] via OPHTHALMIC

## 2015-02-08 MED ORDER — EPINEPHRINE HCL 1 MG/ML IJ SOLN
INTRAOCULAR | Status: DC | PRN
  Administered 2015-02-08: 1 mL via OPHTHALMIC

## 2015-02-08 MED ORDER — CEFUROXIME OPHTHALMIC INJECTION 1 MG/0.1 ML
INJECTION | OPHTHALMIC | Status: AC
Start: 2015-02-08 — End: 2015-02-08
  Filled 2015-02-08: qty 0.1

## 2015-02-08 SURGICAL SUPPLY — 22 items
CANNULA ANT/CHMB 27GA (MISCELLANEOUS) ×3 IMPLANT
CUP MEDICINE 2OZ PLAST GRAD ST (MISCELLANEOUS) ×3 IMPLANT
GLOVE BIO SURGEON STRL SZ8 (GLOVE) ×3 IMPLANT
GLOVE BIOGEL M 6.5 STRL (GLOVE) ×3 IMPLANT
GLOVE SURG LX 8.0 MICRO (GLOVE) ×2
GLOVE SURG LX STRL 8.0 MICRO (GLOVE) ×1 IMPLANT
GOWN STRL REUS W/ TWL LRG LVL3 (GOWN DISPOSABLE) ×2 IMPLANT
GOWN STRL REUS W/TWL LRG LVL3 (GOWN DISPOSABLE) ×4
LENS IOL ACRSF IQ PC 16.5 (Intraocular Lens) ×1 IMPLANT
LENS IOL ACRYSOF IQ POST 16.5 (Intraocular Lens) ×3 IMPLANT
PACK CATARACT (MISCELLANEOUS) ×3 IMPLANT
PACK CATARACT BRASINGTON LX (MISCELLANEOUS) ×3 IMPLANT
PACK EYE AFTER SURG (MISCELLANEOUS) ×3 IMPLANT
SOL BSS BAG (MISCELLANEOUS) ×3
SOL PREP PVP 2OZ (MISCELLANEOUS) ×3
SOLUTION BSS BAG (MISCELLANEOUS) ×1 IMPLANT
SOLUTION PREP PVP 2OZ (MISCELLANEOUS) ×1 IMPLANT
SYR 3ML LL SCALE MARK (SYRINGE) ×3 IMPLANT
SYR 5ML LL (SYRINGE) ×3 IMPLANT
SYR TB 1ML 27GX1/2 LL (SYRINGE) ×3 IMPLANT
WATER STERILE IRR 1000ML POUR (IV SOLUTION) ×3 IMPLANT
WIPE NON LINTING 3.25X3.25 (MISCELLANEOUS) ×3 IMPLANT

## 2015-02-08 NOTE — Anesthesia Postprocedure Evaluation (Signed)
Anesthesia Post Note  Patient: Laura Kline  Procedure(s) Performed: Procedure(s) (LRB): CATARACT EXTRACTION PHACO AND INTRAOCULAR LENS PLACEMENT (IOC) (Left)  Patient location during evaluation: Short Stay Anesthesia Type: MAC Level of consciousness: awake and alert and oriented Pain management: pain level controlled Vital Signs Assessment: post-procedure vital signs reviewed and stable Respiratory status: spontaneous breathing Cardiovascular status: stable Anesthetic complications: no    Last Vitals:  Filed Vitals:   02/08/15 0816 02/08/15 0955  BP: 185/69 148/59  Pulse: 89   Temp: 36.7 C 36.9 C  Resp: 22 20    Last Pain: There were no vitals filed for this visit.               Zachary George

## 2015-02-08 NOTE — Op Note (Signed)
PREOPERATIVE DIAGNOSIS:  Nuclear sclerotic cataract of the left eye.   POSTOPERATIVE DIAGNOSIS:  Nuclear sclerotic cataract of the left eye.   OPERATIVE PROCEDURE: Procedure(s): CATARACT EXTRACTION PHACO AND INTRAOCULAR LENS PLACEMENT (IOC)   SURGEON:  Galen Manila, MD.   ANESTHESIA:  Anesthesiologist: Yves Dill, MD CRNA: Omer Jack, CRNA; Paulette Blanch, CRNA  1.      Managed anesthesia care. 2.      Topical tetracaine drops followed by 2% Xylocaine jelly applied in the preoperative holding area.   COMPLICATIONS:  None.   TECHNIQUE:   Stop and chop   DESCRIPTION OF PROCEDURE:  The patient was examined and consented in the preoperative holding area where the aforementioned topical anesthesia was applied to the left eye and then brought back to the Operating Room where the left eye was prepped and draped in the usual sterile ophthalmic fashion and a lid speculum was placed. A paracentesis was created with the side port blade and the anterior chamber was filled with viscoelastic. A near clear corneal incision was performed with the steel keratome. A continuous curvilinear capsulorrhexis was performed with a cystotome followed by the capsulorrhexis forceps. Hydrodissection and hydrodelineation were carried out with BSS on a blunt cannula. The lens was removed in a stop and chop  technique and the remaining cortical material was removed with the irrigation-aspiration handpiece. The capsular bag was inflated with viscoelastic and the Technis [id number] lens was placed in the capsular bag without complication. The remaining viscoelastic was removed from the eye with the irrigation-aspiration handpiece. The wounds were hydrated. The anterior chamber was flushed with Miostat and the eye was inflated to physiologic pressure. 0.2 mL of Vigamox diluted three/one with BSS was placed in the anterior chamber. The wounds were found to be water tight. The eye was dressed with Vigamox. The patient was  given protective glasses to wear throughout the day and a shield with which to sleep tonight. The patient was also given drops with which to begin a drop regimen today and will follow-up with me in one day.  Implant Name Type Inv. Item Serial No. Manufacturer Lot No. LRB No. Used  IMPLANT LENS - [id number] Intraocular Lens IMPLANT LENS [id number] ALCON   Left 1    Procedure(s) with comments: CATARACT EXTRACTION PHACO AND INTRAOCULAR LENS PLACEMENT (IOC) (Left) - [place] 01:33 AP% 15.9 CDE 14.94 flyuid pack lot # [id number]  Electronically signed: Yaniel Limbaugh LOUIS 02/08/2015 9:53 AM

## 2015-02-08 NOTE — Discharge Instructions (Signed)
Eye Surgery Discharge Instructions  Expect mild scratchy sensation or mild soreness. DO NOT RUB YOUR EYE!  The day of surgery:  Minimal physical activity, but bed rest is not required  No reading, computer work, or close hand work  No bending, lifting, or straining.  May watch TV  For 24 hours:  No driving, legal decisions, or alcoholic beverages  Safety precautions  Eat anything you prefer: It is better to start with liquids, then soup then solid foods.  _____ Eye patch should be worn until postoperative exam tomorrow.  ____ Solar shield eyeglasses should be worn for comfort in the sunlight/patch while sleeping  Resume all regular medications including aspirin or Coumadin if these were discontinued prior to surgery. You may shower, bathe, shave, or wash your hair. Tylenol may be taken for mild discomfort.  Call your doctor if you experience significant pain, nausea, or vomiting, fever > 101 or other signs of infection. [phone] or [phone] Specific instructions:  Follow-up Information    Follow up with Carlena Bjornstad, MD On 02/09/2015.   Specialty:  Ophthalmology   Why:  10:40   Contact information:   [address] [place] [address] [phone]

## 2015-02-08 NOTE — Anesthesia Preprocedure Evaluation (Signed)
Anesthesia Evaluation  Patient identified by MRN, date of birth, ID band  Reviewed: Allergy & Precautions, NPO status , Patient's Chart, lab work & pertinent test results, reviewed documented beta blocker date and time   Airway Mallampati: II  TM Distance: <3 FB Neck ROM: Limited    Dental  (+) Chipped   Pulmonary neg pulmonary ROS,    Pulmonary exam normal breath sounds clear to auscultation       Cardiovascular hypertension, Pt. on medications and Pt. on home beta blockers + CAD and + Peripheral Vascular Disease  Normal cardiovascular exam     Neuro/Psych negative neurological ROS  negative psych ROS   GI/Hepatic negative GI ROS, Neg liver ROS,   Endo/Other  diabetes, Well Controlled, Type 2, Oral Hypoglycemic Agents, Insulin Dependent  Renal/GU negative Renal ROS  negative genitourinary   Musculoskeletal negative musculoskeletal ROS (+)   Abdominal (+) + obese,   Peds negative pediatric ROS (+)  Hematology negative hematology ROS (+)   Anesthesia Other Findings Morbid obesity  Reproductive/Obstetrics                             Anesthesia Physical Anesthesia Plan  ASA: III  Anesthesia Plan: MAC   Post-op Pain Management:    Induction: Intravenous  Airway Management Planned: Nasal Cannula  Additional Equipment:   Intra-op Plan:   Post-operative Plan:   Informed Consent: I have reviewed the patients History and Physical, chart, labs and discussed the procedure including the risks, benefits and alternatives for the proposed anesthesia with the patient or authorized representative who has indicated his/her understanding and acceptance.   Dental advisory given  Plan Discussed with: CRNA and Surgeon  Anesthesia Plan Comments:         Anesthesia Quick Evaluation

## 2015-02-08 NOTE — Transfer of Care (Signed)
Immediate Anesthesia Transfer of Care Note  Patient: Laura Kline  Procedure(s) Performed: Procedure(s) with comments: CATARACT EXTRACTION PHACO AND INTRAOCULAR LENS PLACEMENT (IOC) (Left) - [place] 01:33 AP% 15.9 CDE 14.94 flyuid pack lot # [id number]  Patient Location: PACU and Short Stay  Anesthesia Type:MAC  Level of Consciousness: awake, alert  and oriented  Airway & Oxygen Therapy: Patient Spontanous Breathing  Post-op Assessment: Report given to RN and Post -op Vital signs reviewed and stable  Post vital signs: Reviewed and stable  Last Vitals:  Filed Vitals:   02/08/15 0816 02/08/15 0955  BP: 185/69 148/59  Pulse: 89   Temp: 36.7 C 36.9 C  Resp: 22 20    Complications: No apparent anesthesia complications

## 2015-02-08 NOTE — H&P (Signed)
All labs reviewed. Abnormal studies sent to patients PCP when indicated.  Previous H&P reviewed, patient examined, there are NO CHANGES.  Laura Donna LOUIS1/31/20179:20 AM

## 2016-05-12 ENCOUNTER — Inpatient Hospital Stay
Admission: EM | Admit: 2016-05-12 | Discharge: 2016-05-18 | DRG: 854 | Disposition: A | Discharge status: Skilled Nursing Facility | Payer: BLUE CROSS/BLUE SHIELD | Attending: Internal Medicine | Admitting: Internal Medicine

## 2016-05-12 ENCOUNTER — Emergency Department: Payer: BLUE CROSS/BLUE SHIELD

## 2016-05-12 ENCOUNTER — Inpatient Hospital Stay: Payer: BLUE CROSS/BLUE SHIELD

## 2016-05-12 ENCOUNTER — Encounter: Payer: Self-pay | Admitting: Emergency Medicine

## 2016-05-12 DIAGNOSIS — A419 Sepsis, unspecified organism: Secondary | ICD-10-CM

## 2016-05-12 DIAGNOSIS — Z6841 Body Mass Index (BMI) 40.0 and over, adult: Secondary | ICD-10-CM

## 2016-05-12 DIAGNOSIS — L02612 Cutaneous abscess of left foot: Secondary | ICD-10-CM | POA: Diagnosis present

## 2016-05-12 DIAGNOSIS — E1151 Type 2 diabetes mellitus with diabetic peripheral angiopathy without gangrene: Secondary | ICD-10-CM | POA: Diagnosis present

## 2016-05-12 DIAGNOSIS — I4891 Unspecified atrial fibrillation: Secondary | ICD-10-CM | POA: Diagnosis not present

## 2016-05-12 DIAGNOSIS — E1165 Type 2 diabetes mellitus with hyperglycemia: Secondary | ICD-10-CM | POA: Diagnosis present

## 2016-05-12 DIAGNOSIS — I2510 Atherosclerotic heart disease of native coronary artery without angina pectoris: Secondary | ICD-10-CM | POA: Diagnosis present

## 2016-05-12 DIAGNOSIS — I10 Essential (primary) hypertension: Secondary | ICD-10-CM | POA: Diagnosis present

## 2016-05-12 DIAGNOSIS — L03116 Cellulitis of left lower limb: Secondary | ICD-10-CM

## 2016-05-12 DIAGNOSIS — R6520 Severe sepsis without septic shock: Secondary | ICD-10-CM | POA: Diagnosis present

## 2016-05-12 DIAGNOSIS — A4101 Sepsis due to Methicillin susceptible Staphylococcus aureus: Principal | ICD-10-CM | POA: Diagnosis present

## 2016-05-12 DIAGNOSIS — N179 Acute kidney failure, unspecified: Secondary | ICD-10-CM | POA: Diagnosis present

## 2016-05-12 DIAGNOSIS — I481 Persistent atrial fibrillation: Secondary | ICD-10-CM | POA: Diagnosis not present

## 2016-05-12 DIAGNOSIS — Z8249 Family history of ischemic heart disease and other diseases of the circulatory system: Secondary | ICD-10-CM

## 2016-05-12 DIAGNOSIS — Z7982 Long term (current) use of aspirin: Secondary | ICD-10-CM | POA: Diagnosis not present

## 2016-05-12 DIAGNOSIS — S90852A Superficial foreign body, left foot, initial encounter: Secondary | ICD-10-CM | POA: Diagnosis present

## 2016-05-12 DIAGNOSIS — Z794 Long term (current) use of insulin: Secondary | ICD-10-CM

## 2016-05-12 DIAGNOSIS — I4819 Other persistent atrial fibrillation: Secondary | ICD-10-CM

## 2016-05-12 DIAGNOSIS — Z888 Allergy status to other drugs, medicaments and biological substances status: Secondary | ICD-10-CM | POA: Diagnosis not present

## 2016-05-12 DIAGNOSIS — E876 Hypokalemia: Secondary | ICD-10-CM | POA: Diagnosis present

## 2016-05-12 DIAGNOSIS — I272 Pulmonary hypertension, unspecified: Secondary | ICD-10-CM | POA: Diagnosis present

## 2016-05-12 DIAGNOSIS — R0602 Shortness of breath: Secondary | ICD-10-CM | POA: Diagnosis not present

## 2016-05-12 DIAGNOSIS — Z880 Allergy status to penicillin: Secondary | ICD-10-CM | POA: Diagnosis not present

## 2016-05-12 DIAGNOSIS — E785 Hyperlipidemia, unspecified: Secondary | ICD-10-CM | POA: Diagnosis present

## 2016-05-12 DIAGNOSIS — M869 Osteomyelitis, unspecified: Secondary | ICD-10-CM

## 2016-05-12 LAB — CBC
HCT: 29.4 % — ABNORMAL LOW (ref 35.0–47.0)
Hemoglobin: 9.9 g/dL — ABNORMAL LOW (ref 12.0–16.0)
MCH: 31.7 pg (ref 26.0–34.0)
MCHC: 33.5 g/dL (ref 32.0–36.0)
MCV: 94.5 fL (ref 80.0–100.0)
PLATELETS: 229 10*3/uL (ref 150–440)
RBC: 3.11 MIL/uL — ABNORMAL LOW (ref 3.80–5.20)
RDW: 13.1 % (ref 11.5–14.5)
WBC: 17.5 10*3/uL — AB (ref 3.6–11.0)

## 2016-05-12 LAB — CBC WITH DIFFERENTIAL/PLATELET
BASOS ABS: 0.1 10*3/uL (ref 0–0.1)
BASOS PCT: 0 %
EOS ABS: 0.1 10*3/uL (ref 0–0.7)
EOS PCT: 0 %
HCT: 34 % — ABNORMAL LOW (ref 35.0–47.0)
Hemoglobin: 11.6 g/dL — ABNORMAL LOW (ref 12.0–16.0)
Lymphocytes Relative: 5 %
Lymphs Abs: 1.1 10*3/uL (ref 1.0–3.6)
MCH: 32.5 pg (ref 26.0–34.0)
MCHC: 34.1 g/dL (ref 32.0–36.0)
MCV: 95.1 fL (ref 80.0–100.0)
MONO ABS: 1.9 10*3/uL — AB (ref 0.2–0.9)
MONOS PCT: 9 %
Neutro Abs: 18.9 10*3/uL — ABNORMAL HIGH (ref 1.4–6.5)
Neutrophils Relative %: 86 %
PLATELETS: 284 10*3/uL (ref 150–440)
RBC: 3.58 MIL/uL — ABNORMAL LOW (ref 3.80–5.20)
RDW: 13.1 % (ref 11.5–14.5)
WBC: 22.1 10*3/uL — ABNORMAL HIGH (ref 3.6–11.0)

## 2016-05-12 LAB — COMPREHENSIVE METABOLIC PANEL
ALBUMIN: 3.2 g/dL — AB (ref 3.5–5.0)
ALT: 11 U/L — ABNORMAL LOW (ref 14–54)
ANION GAP: 14 (ref 5–15)
AST: 17 U/L (ref 15–41)
Alkaline Phosphatase: 56 U/L (ref 38–126)
BILIRUBIN TOTAL: 1.2 mg/dL (ref 0.3–1.2)
BUN: 22 mg/dL — ABNORMAL HIGH (ref 6–20)
CALCIUM: 8.3 mg/dL — AB (ref 8.9–10.3)
CO2: 22 mmol/L (ref 22–32)
CREATININE: 1.39 mg/dL — AB (ref 0.44–1.00)
Chloride: 96 mmol/L — ABNORMAL LOW (ref 101–111)
GFR calc non Af Amer: 40 mL/min — ABNORMAL LOW (ref >60)
GFR, EST AFRICAN AMERICAN: 46 mL/min — AB (ref >60)
GLUCOSE: 255 mg/dL — AB (ref 65–99)
POTASSIUM: 3.6 mmol/L (ref 3.5–5.1)
SODIUM: 132 mmol/L — AB (ref 135–145)
Total Protein: 7.7 g/dL (ref 6.5–8.1)

## 2016-05-12 LAB — CREATININE, SERUM
Creatinine, Ser: 1.21 mg/dL — ABNORMAL HIGH (ref 0.44–1.00)
GFR calc Af Amer: 54 mL/min — ABNORMAL LOW (ref >60)
GFR calc non Af Amer: 47 mL/min — ABNORMAL LOW (ref >60)

## 2016-05-12 LAB — GLUCOSE, CAPILLARY: Glucose-Capillary: 261 mg/dL — ABNORMAL HIGH (ref 65–99)

## 2016-05-12 LAB — LACTIC ACID, PLASMA
Lactic Acid, Venous: 2.4 mmol/L (ref 0.5–1.9)
Lactic Acid, Venous: 1.1 mmol/L (ref 0.5–1.9)

## 2016-05-12 LAB — TROPONIN I
Troponin I: <0.03 ng/mL (ref <0.03)
Troponin I: <0.03 ng/mL (ref <0.03)

## 2016-05-12 MED ORDER — ONDANSETRON HCL 4 MG PO TABS: 4.0000 mg | ORAL_TABLET | Freq: Four times a day (QID) | ORAL | Status: DC | PRN

## 2016-05-12 MED ORDER — ASPIRIN EC 81 MG PO TBEC
81.0000 mg | DELAYED_RELEASE_TABLET | Freq: Every day | ORAL | Status: DC
  Administered 2016-05-13 – 2016-05-15 (×3): 81 mg via ORAL
  Filled 2016-05-12 (×3): qty 1

## 2016-05-12 MED ORDER — DILTIAZEM HCL 60 MG PO TABS
60.0000 mg | ORAL_TABLET | Freq: Once | ORAL | Status: AC
  Administered 2016-05-12: 60 mg via ORAL
  Filled 2016-05-12 (×2): qty 1

## 2016-05-12 MED ORDER — ACETAMINOPHEN 650 MG RE SUPP: 650.0000 mg | Freq: Four times a day (QID) | RECTAL | Status: DC | PRN

## 2016-05-12 MED ORDER — INSULIN ASPART 100 UNIT/ML ~~LOC~~ SOLN
0.0000 [IU] | Freq: Three times a day (TID) | SUBCUTANEOUS | Status: DC
  Administered 2016-05-13 (×2): 2 [IU] via SUBCUTANEOUS
  Administered 2016-05-14 (×2): 1 [IU] via SUBCUTANEOUS
  Administered 2016-05-14: 2 [IU] via SUBCUTANEOUS
  Administered 2016-05-15 (×3): 1 [IU] via SUBCUTANEOUS
  Administered 2016-05-16: 2 [IU] via SUBCUTANEOUS
  Administered 2016-05-16 (×2): 1 [IU] via SUBCUTANEOUS
  Administered 2016-05-17: 3 [IU] via SUBCUTANEOUS
  Administered 2016-05-17: 2 [IU] via SUBCUTANEOUS
  Administered 2016-05-17: 1 [IU] via SUBCUTANEOUS
  Administered 2016-05-18: 2 [IU] via SUBCUTANEOUS
  Filled 2016-05-12 (×2): qty 1
  Filled 2016-05-12 (×3): qty 2
  Filled 2016-05-12 (×2): qty 1
  Filled 2016-05-12: qty 2
  Filled 2016-05-12 (×2): qty 1
  Filled 2016-05-12: qty 3
  Filled 2016-05-12 (×2): qty 2
  Filled 2016-05-12 (×2): qty 1

## 2016-05-12 MED ORDER — DILTIAZEM HCL 25 MG/5ML IV SOLN: 10.0000 mg | Freq: Once | INTRAVENOUS | Status: DC

## 2016-05-12 MED ORDER — DEXTROSE 5 % IV SOLN
2.0000 g | Freq: Two times a day (BID) | INTRAVENOUS | Status: DC
  Administered 2016-05-12 – 2016-05-16 (×7): 2 g via INTRAVENOUS
  Filled 2016-05-12 (×11): qty 2

## 2016-05-12 MED ORDER — SODIUM CHLORIDE 0.9 % IV SOLN
INTRAVENOUS | Status: DC
  Administered 2016-05-12 – 2016-05-15 (×6): via INTRAVENOUS

## 2016-05-12 MED ORDER — METOPROLOL TARTRATE 50 MG PO TABS
50.0000 mg | ORAL_TABLET | Freq: Two times a day (BID) | ORAL | Status: DC
  Administered 2016-05-13 – 2016-05-18 (×10): 50 mg via ORAL
  Filled 2016-05-12 (×11): qty 1

## 2016-05-12 MED ORDER — ENOXAPARIN SODIUM 40 MG/0.4ML ~~LOC~~ SOLN
40.0000 mg | Freq: Two times a day (BID) | SUBCUTANEOUS | Status: DC
  Administered 2016-05-12: 40 mg via SUBCUTANEOUS
  Filled 2016-05-12 (×2): qty 0.4

## 2016-05-12 MED ORDER — VANCOMYCIN HCL IN DEXTROSE 1-5 GM/200ML-% IV SOLN
1000.0000 mg | Freq: Once | INTRAVENOUS | Status: AC
  Administered 2016-05-12: 1000 mg via INTRAVENOUS
  Filled 2016-05-12: qty 200

## 2016-05-12 MED ORDER — GADOBENATE DIMEGLUMINE 529 MG/ML IV SOLN
20.0000 mL | Freq: Once | INTRAVENOUS | Status: AC | PRN
Start: 2016-05-12 — End: 2016-05-12
  Administered 2016-05-12: 20 mL via INTRAVENOUS

## 2016-05-12 MED ORDER — MORPHINE SULFATE (PF) 4 MG/ML IV SOLN
4.0000 mg | Freq: Once | INTRAVENOUS | Status: AC
  Administered 2016-05-12: 4 mg via INTRAVENOUS
  Filled 2016-05-12: qty 1

## 2016-05-12 MED ORDER — SODIUM CHLORIDE 0.9 % IV BOLUS (SEPSIS)
500.0000 mL | Freq: Once | INTRAVENOUS | Status: AC
  Administered 2016-05-12: 500 mL via INTRAVENOUS

## 2016-05-12 MED ORDER — SODIUM CHLORIDE 0.9 % IV BOLUS (SEPSIS)
1000.0000 mL | Freq: Once | INTRAVENOUS | Status: AC
  Administered 2016-05-12: 1000 mL via INTRAVENOUS

## 2016-05-12 MED ORDER — INSULIN GLARGINE 100 UNIT/ML ~~LOC~~ SOLN
58.0000 [IU] | Freq: Every day | SUBCUTANEOUS | Status: DC
  Administered 2016-05-12 – 2016-05-17 (×6): 58 [IU] via SUBCUTANEOUS
  Filled 2016-05-12 (×7): qty 0.58

## 2016-05-12 MED ORDER — VANCOMYCIN HCL 10 G IV SOLR
1250.0000 mg | INTRAVENOUS | Status: DC
  Administered 2016-05-12 – 2016-05-13 (×2): 1250 mg via INTRAVENOUS
  Filled 2016-05-12 (×2): qty 1250

## 2016-05-12 MED ORDER — DILTIAZEM HCL 100 MG IV SOLR
5.0000 mg/h | INTRAVENOUS | Status: DC
  Administered 2016-05-12 – 2016-05-13 (×3): 5 mg/h via INTRAVENOUS
  Filled 2016-05-12 (×3): qty 100

## 2016-05-12 MED ORDER — ONDANSETRON HCL 4 MG/2ML IJ SOLN
4.0000 mg | Freq: Once | INTRAMUSCULAR | Status: AC
  Administered 2016-05-12: 4 mg via INTRAVENOUS
  Filled 2016-05-12: qty 2

## 2016-05-12 MED ORDER — INSULIN ASPART 100 UNIT/ML ~~LOC~~ SOLN
0.0000 [IU] | Freq: Every day | SUBCUTANEOUS | Status: DC
  Administered 2016-05-12: 3 [IU] via SUBCUTANEOUS
  Administered 2016-05-13: 2 [IU] via SUBCUTANEOUS
  Filled 2016-05-12: qty 3
  Filled 2016-05-12: qty 2

## 2016-05-12 MED ORDER — AZTREONAM 2 G IJ SOLR
2.0000 g | Freq: Once | INTRAMUSCULAR | Status: AC
  Administered 2016-05-12: 2 g via INTRAVENOUS
  Filled 2016-05-12: qty 2

## 2016-05-12 MED ORDER — ONDANSETRON HCL 4 MG/2ML IJ SOLN
4.0000 mg | Freq: Four times a day (QID) | INTRAMUSCULAR | Status: DC | PRN
  Administered 2016-05-12: 4 mg via INTRAVENOUS
  Filled 2016-05-12: qty 2

## 2016-05-12 MED ORDER — ROSUVASTATIN CALCIUM 10 MG PO TABS
10.0000 mg | ORAL_TABLET | Freq: Every day | ORAL | Status: DC
  Administered 2016-05-12 – 2016-05-17 (×5): 10 mg via ORAL
  Filled 2016-05-12 (×6): qty 1

## 2016-05-12 MED ORDER — ACETAMINOPHEN 325 MG PO TABS
650.0000 mg | ORAL_TABLET | Freq: Four times a day (QID) | ORAL | Status: DC | PRN
  Administered 2016-05-13 – 2016-05-14 (×4): 650 mg via ORAL
  Filled 2016-05-12 (×4): qty 2

## 2016-05-12 MED ORDER — METRONIDAZOLE IN NACL 5-0.79 MG/ML-% IV SOLN
500.0000 mg | Freq: Once | INTRAVENOUS | Status: AC
  Administered 2016-05-12: 500 mg via INTRAVENOUS
  Filled 2016-05-12: qty 100

## 2016-05-12 MED ORDER — ACETAMINOPHEN 325 MG PO TABS
650.0000 mg | ORAL_TABLET | Freq: Once | ORAL | Status: AC
  Administered 2016-05-12: 650 mg via ORAL
  Filled 2016-05-12: qty 2

## 2016-05-12 MED ORDER — INSULIN ASPART 100 UNIT/ML FLEXPEN: 10.0000 [IU] | PEN_INJECTOR | Freq: Three times a day (TID) | SUBCUTANEOUS | Status: DC

## 2016-05-12 NOTE — ED Triage Notes (Signed)
Patient presents to the ED with redness and swelling of her left foot with wound to her 2nd toe on her left foot.  Patient has history of diabetes.  Patient is in no obvious distress at this time.  Patient reports noting wound during mid-week but it has become much worse.  Patient is unsure of how wound occurred.  Patient also has a fever.

## 2016-05-12 NOTE — Progress Notes (Signed)
Pt admitted. Dr traxler in to examine foot. Accompanied pt to mri as she is on cardizem gtt. She tolerated test well. Nausea improved with zofran. Dinner ordered.

## 2016-05-12 NOTE — ED Provider Notes (Signed)
[facility] Emergency Department Provider Note  Time seen: 12:19 PM  I have reviewed the triage vital signs and the nursing notes.   HISTORY  Chief Complaint Wound Infection    HPI Laura Kline is a 61 y.o. female with a past medical history of diabetes, hypertension, hyperlipidemia, obesity, presents the emergency department with a left foot infection. According to the patient for the past one week she's had redness and pain to the left foot. She states approximately 4 days ago she developed a large blister to her second toe which then popped. She states since that time she's had increased pain with progressing redness and now drainage to the bottom of her foot. Patient has a low-grade fever in the emergency department which she states has been intermittent over the past 1 week. She is also tachycardic. Describes her pain as moderate dull aching pain in the left foot.  Past Medical History:  Diagnosis Date  . Coronary atherosclerosis    LAD calcifications noted on CT scan in 2016 with negative nuclear stress test.  . DKA (diabetic ketoacidoses) (HCC)    a. 05/2009  . Essential hypertension   . History of Bell's palsy   . Hyperlipidemia   . Hypertension   . Hypokalemia   . IDDM (insulin dependent diabetes mellitus) (HCC)   . Long-term insulin use (HCC)   . Morbid obesity (HCC)   . Peripheral vascular disease [facility])     Patient Active Problem List   Diagnosis Date Noted  . Hyperlipidemia   . Essential hypertension   . Coronary atherosclerosis   . Chest pain 07/26/2014    Past Surgical History:  Procedure Laterality Date  . CATARACT EXTRACTION W/PHACO Left 02/08/2015   Procedure: CATARACT EXTRACTION PHACO AND INTRAOCULAR LENS PLACEMENT (IOC);  Surgeon: Galen Manila, MD;  Location: [facility] ORS;  Service: Ophthalmology;  Laterality: Left;  [place] 01:33 AP% 15.9 CDE 14.94 flyuid pack lot # [id number]  . CESAREAN SECTION     x2  . EYE SURGERY Right     Cataract Extraction with IOL    Prior to Admission medications   Medication Sig Start Date End Date Taking? Authorizing Provider  aspirin EC 81 MG tablet Take 81 mg by mouth daily.    [provider]  insulin aspart (NOVOLOG FLEXPEN) 100 UNIT/ML FlexPen Inject 10-16 Units into the skin 3 (three) times daily with meals. Per sliding scale    [provider]  Insulin Glargine (LANTUS SOLOSTAR) 100 UNIT/ML Solostar Pen Inject 52 Units into the skin at bedtime.    [provider]  lisinopril-hydrochlorothiazide (PRINZIDE,ZESTORETIC) 20-25 MG tablet Take 1 tablet by mouth daily. 10/07/14   Iran Ouch, MD  metFORMIN (GLUCOPHAGE-XR) 500 MG 24 hr tablet Take 2 tablets by mouth daily. With dinner    [provider]  metoprolol (LOPRESSOR) 50 MG tablet Take 1 tablet (50 mg total) by mouth 2 (two) times daily. 10/07/14   Iran Ouch, MD  rosuvastatin (CRESTOR) 10 MG tablet Take 1 tablet (10 mg total) by mouth daily after supper. 10/07/14   Iran Ouch, MD    Allergies  Allergen Reactions  . Rofecoxib Other (See Comments)  . Penicillins Rash    Family History  Problem Relation Age of Onset  . Hypertension Mother   . Hypertension Father   . CAD Father     s/p cabg  . Heart attack Father   . Heart failure Father     Social History Social History  Substance Use Topics  . Smoking status: Never Smoker  . Smokeless tobacco: Never Used  . Alcohol use No    Review of Systems Constitutional: Positive for intermittent fever. Eyes: Negative for visual changes. ENT: Negative for congestion Cardiovascular: Negative for chest pain. Respiratory: Negative for shortness of breath. Gastrointestinal: Negative for abdominal pain, vomiting and diarrhea. Genitourinary: Negative for dysuria. Musculoskeletal: Left foot pain and swelling and redness, with drainage. Skin: Left foot redness, blister which is now draining. Neurological: Negative for  headache All other ROS negative  ____________________________________________   PHYSICAL EXAM:  VITAL SIGNS: ED Triage Vitals  Enc Vitals Group     BP 05/12/16 1124 131/77     Pulse Rate 05/12/16 1124 (!) 106     Resp 05/12/16 1124 (!) 22     Temp 05/12/16 1124 (!) 100.4 F (38 C)     Temp Source 05/12/16 1124 Oral     SpO2 05/12/16 1124 97 %     Weight 05/12/16 1130 (!) 305 lb (138.3 kg)     Height 05/12/16 1130 5\' 3"  (1.6 m)     Head Circumference --      Peak Flow --      Pain Score 05/12/16 1130 5     Pain Loc --      Pain Edu? --      Excl. in GC? --     Constitutional: Alert and oriented. Well appearing and in no distress. Eyes: Normal exam ENT   Head: Normocephalic and atraumatic   Mouth/Throat: Mucous membranes are moist. Cardiovascular: Irregular rhythm, rate around 150 bpm. Respiratory: Normal respiratory effort without tachypnea nor retractions. Breath sounds are clear  Gastrointestinal: Soft and nontender. No distention.   Musculoskeletal: Moderate left pedal edema with moderate erythema. Patient has a blistered area to the second third digits of the left foot. She appears to have an ulceration to the bottom/plantar aspect of the left foot with drainage. Neurologic:  Normal speech and language. No gross focal neurologic deficits  Skin:  Erythema and drainage as described above in the left foot. Psychiatric: Mood and affect are normal.   ____________________________________________    EKG  EKG reviewed and interpreted by myself shows atrial fibrillation with rapid ventricular response from 160 bpm, narrow QRS, normal axis, large within normal intervals with nonspecific ST changes. No ST elevation.  ____________________________________________    RADIOLOGY  X-ray consistent radiopaque foreign body. Cellulitis without signs of osteomyelitis or gas.  ____________________________________________   INITIAL IMPRESSION / ASSESSMENT AND PLAN / ED  COURSE  Pertinent labs & imaging results that were available during my care of the patient were reviewed by me and considered in my medical decision making (see chart for details).  Patient presents to the emergency department with symptoms of a left foot infection. On exam the patient appears to have cellulitis of the entire left foot with a draining diabetic ulcer to the plantar aspect of the foot and a blistered to the second and third digits on the dorsal aspect. Clear drainage from the ulcer. Erythema extending slightly proximal to the ankle. Moderate tenderness to palpation of this area as well. Patient is tachycardic with a low-grade temperature 100.4 me being sepsis criteria. We will initiate sepsis protocols including broad-spectrum antibiotics. Given the tachycardia we will bolus 30 mL/kilogram IV fluids.  Patient's labs are resulted with her elevated lactic acid of 2.4 and a white blood cell count 22,000. We will dose Tylenol for the fever, oral diltiazem for rate control and  continue with IV fluids. Patient takes metoprolol 50 mg twice daily but has not taken her morning dose per patient. No known history of A. fib in the past.   CRITICAL CARE Performed by: Minna Antis   Total critical care time: 45 minutes  Critical care time was exclusive of separately billable procedures and treating other patients.  Critical care was necessary to treat or prevent imminent or life-threatening deterioration.  Critical care was time spent personally by me on the following activities: development of treatment plan with patient and/or surrogate as well as nursing, discussions with consultants, evaluation of patient's response to treatment, examination of patient, obtaining history from patient or surrogate, ordering and performing treatments and interventions, ordering and review of laboratory studies, ordering and review of radiographic studies, pulse oximetry and re-evaluation of patient's  condition.  X-ray consistent with small foreign body as well as cellulitis. Patient continues to be A. fib with rapid ventricular response run 130 bpm we will start on a diltiazem drip and admitted to the hospital. ____________________________________________   FINAL CLINICAL IMPRESSION(S) / ED DIAGNOSES  Cellulitis Sepsis Atrial fibrillation with rapid ventricular response    Minna Antis, MD 05/12/16 1312

## 2016-05-12 NOTE — Progress Notes (Signed)
Pt. Has a flat red rash under bilateral breast, bilateral axilla, and ABD pannus. Rash cleansed with soap and water, patted dry and antifungal powder applied.

## 2016-05-12 NOTE — ED Notes (Signed)
Code sepsis [facility] [place] [facility]

## 2016-05-12 NOTE — Anesthesia Preprocedure Evaluation (Addendum)
Anesthesia Evaluation  Patient identified by MRN, date of birth, ID band Patient awake    Reviewed: Allergy & Precautions, NPO status , Patient's Chart, lab work & pertinent test results, reviewed documented beta blocker date and time   Airway Mallampati: III  TM Distance: <3 FB Neck ROM: Limited    Dental  (+) Chipped, Edentulous Upper   Pulmonary neg pulmonary ROS,    Pulmonary exam normal breath sounds clear to auscultation       Cardiovascular hypertension, Pt. on medications and Pt. on home beta blockers + CAD and + Peripheral Vascular Disease  Normal cardiovascular exam+ dysrhythmias Atrial Fibrillation      Neuro/Psych Hx of Bell's palsy negative psych ROS   GI/Hepatic negative GI ROS, Neg liver ROS,   Endo/Other  diabetes, Well Controlled, Type 2, Oral Hypoglycemic Agents, Insulin Dependent  Renal/GU negative Renal ROS  negative genitourinary   Musculoskeletal Foot infection   Abdominal (+) + obese,   Peds negative pediatric ROS (+)  Hematology negative hematology ROS (+)   Anesthesia Other Findings Morbid obesity New onset Afib, most likely from the sepsis  Reproductive/Obstetrics                          Anesthesia Physical  Anesthesia Plan  ASA: III and emergent  Anesthesia Plan: General   Post-op Pain Management:    Induction: Intravenous  Airway Management Planned: Nasal Cannula  Additional Equipment:   Intra-op Plan:   Post-operative Plan: Extubation in OR  Informed Consent: I have reviewed the patients History and Physical, chart, labs and discussed the procedure including the risks, benefits and alternatives for the proposed anesthesia with the patient or authorized representative who has indicated his/her understanding and acceptance.   Dental advisory given  Plan Discussed with: CRNA and Surgeon  Anesthesia Plan Comments: (Ankle block per surgeon)       Anesthesia Quick Evaluation

## 2016-05-12 NOTE — Progress Notes (Signed)
Consent signed and placed on chart

## 2016-05-12 NOTE — Consult Note (Signed)
Patient Demographics  Laura Kline, is a 61 y.o. female   MRN: [id number]   DOB - December 06, 1955  Admit Date - 05/12/2016    Outpatient Primary MD for the patient is Patient, No Pcp Per  Consult requested in the Hospital by Houston Siren, MD, On 05/12/2016    Reason for consult left foot secondary to foreign body   With History of -  Past Medical History:  Diagnosis Date  . Coronary atherosclerosis    LAD calcifications noted on CT scan in 2016 with negative nuclear stress test.  . DKA (diabetic ketoacidoses) (HCC)    a. 05/2009  . Essential hypertension   . History of Bell's palsy   . Hyperlipidemia   . Hypertension   . Hypokalemia   . IDDM (insulin dependent diabetes mellitus) (HCC)   . Long-term insulin use (HCC)   . Morbid obesity (HCC)   . Peripheral vascular disease [facility])       Past Surgical History:  Procedure Laterality Date  . CATARACT EXTRACTION W/PHACO Left 02/08/2015   Procedure: CATARACT EXTRACTION PHACO AND INTRAOCULAR LENS PLACEMENT (IOC);  Surgeon: Galen Manila, MD;  Location: [facility] ORS;  Service: Ophthalmology;  Laterality: Left;  [place] 01:33 AP% 15.9 CDE 14.94 flyuid pack lot # [id number]  . CESAREAN SECTION     x2  . EYE SURGERY Right    Cataract Extraction with IOL    in for   Chief Complaint  Patient presents with  . Wound Infection     HPI  Laura Kline  is a 61 y.o. female, Admitted through the emergency room department today because of noted cellulitis. She is septic with a white count of over 21 and a red swollen left foot. She denies any history of stepping on anything but x-rays show a metallic foreign body plantarly and there is also an entry wound on that area.    Review of Systems  : Patient is a morbidly obese 61 year old Caucasian female she is alert seems  to be well oriented she is febrile  In addition to the HPI above,   No Headache, No changes with Vision or hearing, No problems swallowing food or Liquids, No Chest pain, Cough or Shortness of Breath, No Abdominal pain, No Nausea or Vommitting, Bowel movements are regular, No Blood in stool or Urine, No dysuria, Lower extremity skin shows redness swelling there is also an entry point injury to the plantar left foot which will be addressed further and lower extremity exam. No new joints pains-aches,  No new weakness, tingling, numbness in any extremity, patient has peripheral neuropathy secondary to diabetes. No polyuria, polydypsia or polyphagia,   A full 10 point Review of Systems was done, except as stated above, all other Review of Systems were negative.   Social History Social History  Substance Use Topics  . Smoking status: Never Smoker  . Smokeless tobacco: Never Used  . Alcohol use No     Family History Family History  Problem Relation Age of Onset  . Hypertension Mother   . Hypertension Father   . CAD Father     s/p cabg  . Heart attack Father   . Heart failure Father  Prior to Admission medications   Medication Sig Start Date End Date Taking? Authorizing Provider  aspirin EC 81 MG tablet Take 81 mg by mouth daily.   Yes [provider]  insulin aspart (NOVOLOG FLEXPEN) 100 UNIT/ML FlexPen Inject 10-16 Units into the skin 3 (three) times daily with meals. Per sliding scale   Yes [provider]  Insulin Glargine (LANTUS SOLOSTAR) 100 UNIT/ML Solostar Pen Inject 58 Units into the skin at bedtime.    Yes [provider]  lisinopril-hydrochlorothiazide (PRINZIDE,ZESTORETIC) 20-25 MG tablet Take 1 tablet by mouth daily. 10/07/14  Yes Iran OuchArida, Muhammad A, MD  metFORMIN (GLUCOPHAGE-XR) 500 MG 24 hr tablet Take 2 tablets by mouth daily. With dinner   Yes [provider]  metoprolol (LOPRESSOR) 50 MG tablet Take 1 tablet (50 mg  total) by mouth 2 (two) times daily. 10/07/14  Yes Iran OuchArida, Muhammad A, MD  rosuvastatin (CRESTOR) 10 MG tablet Take 1 tablet (10 mg total) by mouth daily after supper. 10/07/14  Yes Iran OuchArida, Muhammad A, MD    Anti-infectives    Start     Dose/Rate Route Frequency Ordered Stop   05/12/16 2200  ceFEPIme (MAXIPIME) 2 g in dextrose 5 % 50 mL IVPB     2 g 100 mL/hr over 30 Minutes Intravenous Every 12 hours 05/12/16 1449     05/12/16 1800  vancomycin (VANCOCIN) 1,250 mg in sodium chloride 0.9 % 250 mL IVPB     1,250 mg 166.7 mL/hr over 90 Minutes Intravenous Every 18 hours 05/12/16 1449     05/12/16 1200  aztreonam (AZACTAM) 2 g in dextrose 5 % 50 mL IVPB     2 g 100 mL/hr over 30 Minutes Intravenous  Once 05/12/16 1151 05/12/16 1434   05/12/16 1200  metroNIDAZOLE (FLAGYL) IVPB 500 mg     500 mg 100 mL/hr over 60 Minutes Intravenous  Once 05/12/16 1151 05/12/16 1343   05/12/16 1200  vancomycin (VANCOCIN) IVPB 1000 mg/200 mL premix     1,000 mg 200 mL/hr over 60 Minutes Intravenous  Once 05/12/16 1151 05/12/16 1316      Scheduled Meds: . insulin aspart  0-5 Units Subcutaneous QHS  . insulin aspart  0-9 Units Subcutaneous TID WC   Continuous Infusions: . sodium chloride    . ceFEPime (MAXIPIME) IV    . diltiazem (CARDIZEM) infusion 5 mg/hr (05/12/16 1340)  . vancomycin     PRN Meds:.  Allergies  Allergen Reactions  . Rofecoxib Other (See Comments)  . Penicillins Rash    Physical Exam  Vitals  Blood pressure 115/66, pulse 97, temperature (!) 100.4 F (38 C), temperature source Oral, resp. rate 19, height 5\' 3"  (1.6 m), weight (!) 138.3 kg (305 lb), SpO2 96 %.  Lower Extremity exam:  Vascular:DP and PT pulses diminished  Dermatological: Patient has a small puncture wounds, closed over on the plantar submetatarsal 2 area of the left foot. There is some blistering and affect emanating from this area and appears to be involving the base of the toe and the metatarsophalangeal joint  region. There is no break in the skin dorsally on the foot but there is a blister formation on the toe and likely from purulence. Toe still appeared be viable at this point as far skull or concern.  Neurological: Diabetic peripheral neuropathy  Ortho: Foreign body plantar medial to the base of the proximal phalanx on the left foot  Data Review  CBC  Recent Labs Lab 05/12/16 1130  WBC 22.1*  HGB 11.6*  HCT 34.0*  PLT 284  MCV 95.1  MCH 32.5  MCHC 34.1  RDW 13.1  LYMPHSABS 1.1  MONOABS 1.9*  EOSABS 0.1  BASOSABS 0.1   ------------------------------------------------------------------------------------------------------------------  Chemistries   Recent Labs Lab 05/12/16 1130  NA 132*  K 3.6  CL 96*  CO2 22  GLUCOSE 255*  BUN 22*  CREATININE 1.39*  CALCIUM 8.3*  AST 17  ALT 11*  ALKPHOS 56  BILITOT 1.2   ------------------------------------------------------------------------------------------------------------------Imaging results:   Dg Foot Complete Left  Result Date: 05/12/2016 CLINICAL DATA:  Left foot swelling. EXAM: LEFT FOOT - COMPLETE 3+ VIEW COMPARISON:  None. FINDINGS: There is no evidence of fracture or dislocation. There is no evidence of arthropathy. Spurring of posterior calcaneus is noted. Dorsal soft tissue swelling is noted concerning for cellulitis. Foreign body is seen in plantar soft tissues adjacent to second proximal phalanx. IMPRESSION: Radiopaque foreign body is noted in second toe. Dorsal soft tissue swelling is noted consistent with cellulitis. No fracture or dislocation is noted. Electronically Signed   By: Lupita Raider, M.D.   On: 05/12/2016 12:20   :   Assessment & Plan: Cellulitis infection to the left foot with abscess formation plantarly and around the second metatarsophalangeal joint with then evidence of foreign body on x-ray. Patient needs incision and drainage to the region. I'll get this set up first thing from our morning  meanwhile she is started on antibiotics over the culture tomorrow morning also. No evidence of gas on the tissues on x-ray but noted swelling is noted. We'll likely need incision and drainage to the plantar area and also possibly dorsally. Currently the toe color is viable but I explained to her that with the severity of the infection she could end up getting a loss of vascular status to the second toe and likely lose a second toe amputation if necessary. This female to be necessary anyway if there proves to be osteo-in the region. I explained that tomorrow I'll robbery objective is to get the infection under control with incision and drainage and removal of the foreign body to the region.  Active Problems:   Sepsis [facility])  Family Communication: Plan discussed with patient .  Epimenio Sarin M.D on 05/12/2016 at 3:28 PM  Thank you for the consult, we will follow the patient with you in the Hospital.

## 2016-05-12 NOTE — Progress Notes (Signed)
Anticoagulation monitoring(Lovenox):  61 yo female ordered Lovenox 40 mg Q24h  Filed Weights   05/12/16 1130  Weight: (!) 305 lb (138.3 kg)   BMI 54.1   Lab Results  Component Value Date   CREATININE 1.39 (H) 05/12/2016   CREATININE 0.92 07/27/2014   CREATININE 1.08 (H) 07/26/2014   Estimated Creatinine Clearance: 57.5 mL/min (A) (by C-G formula based on SCr of 1.39 mg/dL (H)). Hemoglobin & Hematocrit     Component Value Date/Time   HGB 11.6 (L) 05/12/2016 1130   HCT 34.0 (L) 05/12/2016 1130     Per Protocol for Patient with estCrcl > 30 ml/min and BMI > 40, will transition to Lovenox 40 mg Q12h.

## 2016-05-12 NOTE — ED Notes (Signed)
MD Paduchowski at bedside  

## 2016-05-12 NOTE — H&P (Signed)
[employer] [facility] [place] [facility]   PATIENT NAME: Laura Kline    MR#:  [id number]  DATE OF BIRTH:  08-Mar-1955  DATE OF ADMISSION:  05/12/2016  PRIMARY CARE PHYSICIAN: Patient, No Pcp Per   REQUESTING/REFERRING PHYSICIAN: Dr. Minna Antis  CHIEF COMPLAINT:   Chief Complaint  Patient presents with  . Wound Infection    HISTORY OF PRESENT ILLNESS:  Laura Kline  is a 61 y.o. female with a known history of Diabetes, coronary artery disease, obesity, hypertension, hyperlipidemia, peripheral vascular disease who presents to the hospital due to left lower extremity redness swelling and pain that has progressively gotten worse this past week. Patient noticed that her left foot was increasingly red and painful earlier this week and has progressively gotten worse. She developed some chills, but no documented fever. Patient also was noted to be tachycardic when arrival to the ER noted to be in atrial fibrillation with rapid response and is currently on a Cardizem drip. She came to the ER for further evaluation as her leg was getting worse and was diagnosed with left leg cellulitis with a foreign body and sepsis, and hospitalist services were contacted further treatment and evaluation. She admits to a chronic exertional dyspnea, but denies any chest pains, nausea, vomiting, abdominal pain, diarrhea or any other associated symptoms presently.  PAST MEDICAL HISTORY:   Past Medical History:  Diagnosis Date  . Coronary atherosclerosis    LAD calcifications noted on CT scan in 2016 with negative nuclear stress test.  . DKA (diabetic ketoacidoses) (HCC)    a. 05/2009  . Essential hypertension   . History of Bell's palsy   . Hyperlipidemia   . Hypertension   . Hypokalemia   . IDDM (insulin dependent diabetes mellitus) (HCC)   . Long-term insulin use (HCC)   . Morbid obesity (HCC)   . Peripheral vascular disease (HCC)     PAST SURGICAL HISTORY:   Past Surgical History:   Procedure Laterality Date  . CATARACT EXTRACTION W/PHACO Left 02/08/2015   Procedure: CATARACT EXTRACTION PHACO AND INTRAOCULAR LENS PLACEMENT (IOC);  Surgeon: Galen Manila, MD;  Location: [facility] ORS;  Service: Ophthalmology;  Laterality: Left;  [place] 01:33 AP% 15.9 CDE 14.94 flyuid pack lot # [id number]  . CESAREAN SECTION     x2  . EYE SURGERY Right    Cataract Extraction with IOL    SOCIAL HISTORY:   Social History  Substance Use Topics  . Smoking status: Never Smoker  . Smokeless tobacco: Never Used  . Alcohol use No    FAMILY HISTORY:   Family History  Problem Relation Age of Onset  . Hypertension Mother   . Hypertension Father   . CAD Father     s/p cabg  . Heart attack Father   . Heart failure Father     DRUG ALLERGIES:   Allergies  Allergen Reactions  . Rofecoxib Other (See Comments)  . Penicillins Rash    REVIEW OF SYSTEMS:   Review of Systems  Constitutional: Positive for chills. Negative for fever and weight loss.  HENT: Negative for congestion, nosebleeds and tinnitus.   Eyes: Negative for blurred vision, double vision and redness.  Respiratory: Negative for cough, hemoptysis and shortness of breath.   Cardiovascular: Negative for chest pain, orthopnea, leg swelling and PND.  Gastrointestinal: Negative for abdominal pain, diarrhea, melena, nausea and vomiting.  Genitourinary: Negative for dysuria, hematuria and urgency.  Musculoskeletal: Positive for joint pain (Left foot pain). Negative for falls.  Neurological: Negative for dizziness, tingling, sensory change, focal weakness, seizures, weakness and headaches.  Endo/Heme/Allergies: Negative for polydipsia. Does not bruise/bleed easily.  Psychiatric/Behavioral: Negative for depression and memory loss. The patient is not nervous/anxious.     MEDICATIONS AT HOME:   Prior to Admission medications   Medication Sig Start Date End Date Taking? Authorizing Provider  aspirin EC 81 MG tablet Take 81 mg by  mouth daily.   Yes [provider]  insulin aspart (NOVOLOG FLEXPEN) 100 UNIT/ML FlexPen Inject 10-16 Units into the skin 3 (three) times daily with meals. Per sliding scale   Yes [provider]  Insulin Glargine (LANTUS SOLOSTAR) 100 UNIT/ML Solostar Pen Inject 58 Units into the skin at bedtime.    Yes [provider]  lisinopril-hydrochlorothiazide (PRINZIDE,ZESTORETIC) 20-25 MG tablet Take 1 tablet by mouth daily. 10/07/14  Yes Iran Ouch, MD  metFORMIN (GLUCOPHAGE-XR) 500 MG 24 hr tablet Take 2 tablets by mouth daily. With dinner   Yes [provider]  metoprolol (LOPRESSOR) 50 MG tablet Take 1 tablet (50 mg total) by mouth 2 (two) times daily. 10/07/14  Yes Iran Ouch, MD  rosuvastatin (CRESTOR) 10 MG tablet Take 1 tablet (10 mg total) by mouth daily after supper. 10/07/14  Yes Iran Ouch, MD      VITAL SIGNS:  Blood pressure 110/64, pulse (!) 116, temperature (!) 100.4 F (38 C), temperature source Oral, resp. rate 19, height 5\' 3"  (1.6 m), weight (!) 138.3 kg (305 lb), SpO2 96 %.  PHYSICAL EXAMINATION:  Physical Exam  GENERAL:  61 y.o.-year-old obese patient lying in the bed with no acute distress.  EYES: Pupils equal, round, reactive to light and accommodation. No scleral icterus. Extraocular muscles intact.  HEENT: Head atraumatic, normocephalic. Oropharynx and nasopharynx clear. No oropharyngeal erythema, moist oral mucosa  NECK:  Supple, no jugular venous distention. No thyroid enlargement, no tenderness.  LUNGS: Normal breath sounds bilaterally, no wheezing, rales, rhonchi. No use of accessory muscles of respiration.  CARDIOVASCULAR: S1, S2 Irregular. No murmurs, rubs, gallops, clicks.  ABDOMEN: Soft, nontender, nondistended. Bowel sounds present. No organomegaly or mass.  EXTREMITIES: No pedal edema, cyanosis, or clubbing. + 2 pedal & radial pulses b/l.  Left foot redness swelling consistent with cellulitis. Left second toe  with some pus drainage in the dorsum and plantar part of the foot. NEUROLOGIC: Cranial nerves II through XII are intact. No focal Motor or sensory deficits appreciated b/l PSYCHIATRIC: The patient is alert and oriented x 3. Good affect.  SKIN: No obvious rash, lesion, or ulcer.   LABORATORY PANEL:   CBC  Recent Labs Lab 05/12/16 1130  WBC 22.1*  HGB 11.6*  HCT 34.0*  PLT 284   ------------------------------------------------------------------------------------------------------------------  Chemistries   Recent Labs Lab 05/12/16 1130  NA 132*  K 3.6  CL 96*  CO2 22  GLUCOSE 255*  BUN 22*  CREATININE 1.39*  CALCIUM 8.3*  AST 17  ALT 11*  ALKPHOS 56  BILITOT 1.2   ------------------------------------------------------------------------------------------------------------------  Cardiac Enzymes No results for input(s): TROPONINI in the last 168 hours. ------------------------------------------------------------------------------------------------------------------  RADIOLOGY:  Dg Foot Complete Left  Result Date: 05/12/2016 CLINICAL DATA:  Left foot swelling. EXAM: LEFT FOOT - COMPLETE 3+ VIEW COMPARISON:  None. FINDINGS: There is no evidence of fracture or dislocation. There is no evidence of arthropathy. Spurring of posterior calcaneus is noted. Dorsal soft tissue swelling is noted concerning for cellulitis. Foreign body is seen in plantar soft tissues adjacent to second proximal phalanx.  IMPRESSION: Radiopaque foreign body is noted in second toe. Dorsal soft tissue swelling is noted consistent with cellulitis. No fracture or dislocation is noted. Electronically Signed   By: Lupita RaiderJames  Green Jr, M.D.   On: 05/12/2016 12:20     IMPRESSION AND PLAN:   61 year old female with past medical history of coronary artery disease, diabetes, peripheral vascular disease, hypertension, hyperlipidemia who presents to the hospital due to left lower extremity redness swelling and pain  and noted to have cellulitis.  1. Sepsis-patient meets criteria given patient's elevated lactic acid, leukocytosis and left lower extremity findings suggestive of cellulitis. -I will treat the patient with broad-spectrum IV antibiotics with vancomycin, cefepime. -Follow blood, wound cultures. We'll consult podiatry.  2. Left lower extremity cellulitis/possible osteomyelitis-this is the cause of patient's sepsis. -Treated with IV Vanco, Cefepime.  - will get Podiatry consult. Pt. Likely will need I & D.  - will get MRI of left foot.   3. Atrial fibrillation with rapid ventricular response-this is new onset for the patient. Suspect this is probably initiated due to the underlying sepsis. -Continue Cardizem drip, will continue oral metoprolol to wean off the drip. -We'll cycle cardiac markers keep on telemetry, get a cardiology consult, check a two-dimensional echocardiogram. Next  4. Essential hypertension-we'll continue metoprolol.  5. Hyperlipidemia-continue Crestor.  6. Leukocytosis-secondary to sepsis and cellulitis. Follow with IV antibiotic therapy.  7. Acute kidney injury-secondary to sepsis, continue IV fluids, hold lisinopril/HCTZ. -Follow BUN/creatinine.    All the records are reviewed and case discussed with ED provider. Management plans discussed with the patient, family and they are in agreement.  CODE STATUS: Full Code  TOTAL TIME TAKING CARE OF THIS PATIENT: 45 minutes.    Houston SirenSAINANI,VIVEK J M.D on 05/12/2016 at 2:21 PM  Between 7am to 6pm - Pager - [phone]  After 6pm go to [email or web address] - password [email or web address]  [facility]  Office  [phone]  CC: Primary care physician; Patient, No Pcp Per

## 2016-05-12 NOTE — Progress Notes (Signed)
Pharmacy Antibiotic Note  Laura Kline is a 61 y.o. female admitted on 05/12/2016 with cellulitis and possible osteomyelitis. Pharmacy has been consulted for cefepime and vancomycin dosing. Pt received 1 dose metronidazole, aztreonam and vancomycin 1000 mg IV in ED. Spoke with admitting physician about low likelihood of cross-reactivity with patient's penicillin reaction and cefepime so will transition metronidazole/aztreonam to cefepime.  Plan: Begin cefepime 2 g IV q 12 hours  Begin vancomycin 1250 IV q 18 hours (~6 hour stacked dosing). Targeting trough of 15-20 for possible osteo. Will not order trough at this time as the patient is in AKI and dose may need to be adjusted. Pt is also at risk for accumulation. Ke = 0.052 t1/2=13.3 Vd = 60 Cmin ss estimated to be 16  Height: 5\' 3"  (160 cm) Weight: (!) 305 lb (138.3 kg) IBW/kg (Calculated) : 52.4  Temp (24hrs), Avg:100.4 F (38 C), Min:100.4 F (38 C), Max:100.4 F (38 C)   Recent Labs Lab 05/12/16 1130  WBC 22.1*  CREATININE 1.39*  LATICACIDVEN 2.4*    Estimated Creatinine Clearance: 57.5 mL/min (A) (by C-G formula based on SCr of 1.39 mg/dL (H)).    Allergies  Allergen Reactions  . Rofecoxib Other (See Comments)  . Penicillins Rash    Antimicrobials this admission: Metronidazole, aztreonam 5/5 >> once vancomycin 5/5 >>  Cefepime 5/5 >>  Dose adjustments this admission:  Microbiology results: 5/5 BCx: sent 5/5 UCx: sent   Sputum:    MRSA PCR:   Thank you for allowing pharmacy to be a part of this patient's care.  Horris LatinoHolly Gilliam, PharmD Pharmacy Resident 05/12/2016 2:53 PM

## 2016-05-13 ENCOUNTER — Encounter
Admission: EM | Disposition: A | Discharge status: Skilled Nursing Facility | Payer: Self-pay | Source: Home / Self Care | Attending: Internal Medicine

## 2016-05-13 ENCOUNTER — Inpatient Hospital Stay: Payer: BLUE CROSS/BLUE SHIELD | Admitting: Anesthesiology

## 2016-05-13 ENCOUNTER — Encounter: Payer: Self-pay | Admitting: Podiatry

## 2016-05-13 DIAGNOSIS — I2510 Atherosclerotic heart disease of native coronary artery without angina pectoris: Secondary | ICD-10-CM

## 2016-05-13 DIAGNOSIS — I4891 Unspecified atrial fibrillation: Secondary | ICD-10-CM

## 2016-05-13 DIAGNOSIS — I10 Essential (primary) hypertension: Secondary | ICD-10-CM

## 2016-05-13 HISTORY — PX: INCISION AND DRAINAGE: SHX5863

## 2016-05-13 HISTORY — PX: FOREIGN BODY REMOVAL: SHX962

## 2016-05-13 LAB — GLUCOSE, CAPILLARY
GLUCOSE-CAPILLARY: 194 mg/dL — AB (ref 65–99)
GLUCOSE-CAPILLARY: 181 mg/dL — AB (ref 65–99)
GLUCOSE-CAPILLARY: 160 mg/dL — AB (ref 65–99)
Glucose-Capillary: 187 mg/dL — ABNORMAL HIGH (ref 65–99)
Glucose-Capillary: 227 mg/dL — ABNORMAL HIGH (ref 65–99)

## 2016-05-13 LAB — CBC
HCT: 28.1 % — ABNORMAL LOW (ref 35.0–47.0)
Hemoglobin: 9.3 g/dL — ABNORMAL LOW (ref 12.0–16.0)
MCH: 31.4 pg (ref 26.0–34.0)
MCHC: 33.2 g/dL (ref 32.0–36.0)
MCV: 94.6 fL (ref 80.0–100.0)
PLATELETS: 203 10*3/uL (ref 150–440)
RBC: 2.97 MIL/uL — AB (ref 3.80–5.20)
RDW: 13.2 % (ref 11.5–14.5)
WBC: 13.1 10*3/uL — ABNORMAL HIGH (ref 3.6–11.0)

## 2016-05-13 LAB — URINALYSIS, COMPLETE (UACMP) WITH MICROSCOPIC
Bilirubin Urine: NEGATIVE
HGB URINE DIPSTICK: NEGATIVE
KETONES UR: NEGATIVE mg/dL
LEUKOCYTES UA: NEGATIVE
NITRITE: NEGATIVE
PH: 5 (ref 5.0–8.0)
PROTEIN: NEGATIVE mg/dL
Specific Gravity, Urine: 1.013 (ref 1.005–1.030)

## 2016-05-13 LAB — BASIC METABOLIC PANEL
Anion gap: 5 (ref 5–15)
BUN: 24 mg/dL — ABNORMAL HIGH (ref 6–20)
CALCIUM: 7 mg/dL — AB (ref 8.9–10.3)
CHLORIDE: 108 mmol/L (ref 101–111)
CO2: 22 mmol/L (ref 22–32)
CREATININE: 1.11 mg/dL — AB (ref 0.44–1.00)
GFR calc non Af Amer: 52 mL/min — ABNORMAL LOW (ref >60)
Glucose, Bld: 233 mg/dL — ABNORMAL HIGH (ref 65–99)
Potassium: 3.3 mmol/L — ABNORMAL LOW (ref 3.5–5.1)
SODIUM: 135 mmol/L (ref 135–145)

## 2016-05-13 LAB — TROPONIN I

## 2016-05-13 SURGERY — INCISION AND DRAINAGE
Anesthesia: General | Laterality: Left

## 2016-05-13 MED ORDER — LIDOCAINE 1 % OPTIME INJ - NO CHARGE
INTRAMUSCULAR | Status: DC | PRN
  Administered 2016-05-13: 5 mL

## 2016-05-13 MED ORDER — FENTANYL CITRATE (PF) 100 MCG/2ML IJ SOLN
INTRAMUSCULAR | Status: AC
  Filled 2016-05-13: qty 2

## 2016-05-13 MED ORDER — DILTIAZEM LOAD VIA INFUSION
INTRAVENOUS | Status: DC | PRN
  Administered 2016-05-13: 10 mg via INTRAVENOUS

## 2016-05-13 MED ORDER — MIDAZOLAM HCL 2 MG/2ML IJ SOLN
INTRAMUSCULAR | Status: AC
  Filled 2016-05-13: qty 2

## 2016-05-13 MED ORDER — PROPOFOL 10 MG/ML IV BOLUS
INTRAVENOUS | Status: DC | PRN
  Administered 2016-05-13: 20 mg via INTRAVENOUS

## 2016-05-13 MED ORDER — DILTIAZEM HCL 25 MG/5ML IV SOLN
INTRAVENOUS | Status: AC
  Filled 2016-05-13: qty 5

## 2016-05-13 MED ORDER — METOPROLOL TARTRATE 5 MG/5ML IV SOLN
INTRAVENOUS | Status: DC | PRN
  Administered 2016-05-13: 5 mg via INTRAVENOUS

## 2016-05-13 MED ORDER — APIXABAN 5 MG PO TABS
5.0000 mg | ORAL_TABLET | Freq: Two times a day (BID) | ORAL | Status: DC
  Administered 2016-05-14 – 2016-05-18 (×9): 5 mg via ORAL
  Filled 2016-05-13 (×9): qty 1

## 2016-05-13 MED ORDER — LIDOCAINE 2% (20 MG/ML) 5 ML SYRINGE
INTRAMUSCULAR | Status: DC | PRN
  Administered 2016-05-13: 50 mg via INTRAVENOUS

## 2016-05-13 MED ORDER — MIDAZOLAM HCL 5 MG/5ML IJ SOLN
INTRAMUSCULAR | Status: DC | PRN
  Administered 2016-05-13 (×2): 1 mg via INTRAVENOUS

## 2016-05-13 MED ORDER — LIDOCAINE HCL (PF) 1 % IJ SOLN
INTRAMUSCULAR | Status: AC
  Filled 2016-05-13: qty 30

## 2016-05-13 MED ORDER — LIDOCAINE HCL (PF) 2 % IJ SOLN
INTRAMUSCULAR | Status: AC
Start: 2016-05-13 — End: 2016-05-13
  Filled 2016-05-13: qty 2

## 2016-05-13 MED ORDER — ONDANSETRON HCL 4 MG/2ML IJ SOLN: 4.0000 mg | Freq: Once | INTRAMUSCULAR | Status: DC | PRN

## 2016-05-13 MED ORDER — FENTANYL CITRATE (PF) 100 MCG/2ML IJ SOLN
INTRAMUSCULAR | Status: DC | PRN
  Administered 2016-05-13 (×2): 50 ug via INTRAVENOUS

## 2016-05-13 MED ORDER — PROPOFOL 500 MG/50ML IV EMUL
INTRAVENOUS | Status: DC | PRN
  Administered 2016-05-13: 25 ug/kg/min via INTRAVENOUS

## 2016-05-13 MED ORDER — BUPIVACAINE HCL (PF) 0.5 % IJ SOLN
INTRAMUSCULAR | Status: AC
  Filled 2016-05-13: qty 30

## 2016-05-13 MED ORDER — LIDOCAINE HCL 1 % IJ SOLN
INTRAMUSCULAR | Status: DC | PRN
  Administered 2016-05-13: 10 mL

## 2016-05-13 MED ORDER — FENTANYL CITRATE (PF) 100 MCG/2ML IJ SOLN
25.0000 ug | INTRAMUSCULAR | Status: DC | PRN
  Administered 2016-05-13 (×3): 25 ug via INTRAVENOUS

## 2016-05-13 MED ORDER — PROPOFOL 10 MG/ML IV BOLUS
INTRAVENOUS | Status: AC
  Filled 2016-05-13: qty 20

## 2016-05-13 MED ORDER — VANCOMYCIN HCL 10 G IV SOLR
1250.0000 mg | Freq: Two times a day (BID) | INTRAVENOUS | Status: DC
  Administered 2016-05-13 – 2016-05-14 (×3): 1250 mg via INTRAVENOUS
  Filled 2016-05-13 (×5): qty 1250

## 2016-05-13 SURGICAL SUPPLY — 39 items
BAG COUNTER SPONGE EZ (MISCELLANEOUS) ×2 IMPLANT
BANDAGE ELASTIC 3 LF NS (GAUZE/BANDAGES/DRESSINGS) ×3 IMPLANT
BANDAGE ELASTIC 4 LF NS (GAUZE/BANDAGES/DRESSINGS) ×3 IMPLANT
BANDAGE STRETCH 3X4.1 STRL (GAUZE/BANDAGES/DRESSINGS) ×3 IMPLANT
BNDG ESMARK 4X12 TAN STRL LF (GAUZE/BANDAGES/DRESSINGS) ×3 IMPLANT
BNDG GAUZE 4.5X4.1 6PLY STRL (MISCELLANEOUS) ×3 IMPLANT
CANISTER SUCT 1200ML W/VALVE (MISCELLANEOUS) ×3 IMPLANT
COUNTER SPONGE BAG EZ (MISCELLANEOUS) ×1
CUFF TOURN 18 STER (MISCELLANEOUS) ×3 IMPLANT
CUFF TOURN DUAL PL 12 NO SLV (MISCELLANEOUS) ×3 IMPLANT
DRAPE FLUOR MINI C-ARM 54X84 (DRAPES) ×3 IMPLANT
DURAPREP 26ML APPLICATOR (WOUND CARE) ×3 IMPLANT
ELECT REM PT RETURN 9FT ADLT (ELECTROSURGICAL) ×3
ELECTRODE REM PT RTRN 9FT ADLT (ELECTROSURGICAL) ×1 IMPLANT
GAUZE PETRO XEROFOAM 1X8 (MISCELLANEOUS) ×3 IMPLANT
GAUZE SPONGE 4X4 12PLY STRL (GAUZE/BANDAGES/DRESSINGS) ×3 IMPLANT
GAUZE XEROFORM 4X4 STRL (GAUZE/BANDAGES/DRESSINGS) ×3 IMPLANT
GLOVE BIO SURGEON STRL SZ8 (GLOVE) ×3 IMPLANT
GLOVE INDICATOR 8.0 STRL GRN (GLOVE) ×3 IMPLANT
GOWN STRL REUS W/ TWL LRG LVL3 (GOWN DISPOSABLE) ×1 IMPLANT
GOWN STRL REUS W/TWL LRG LVL3 (GOWN DISPOSABLE) ×2
KIT RM TURNOVER STRD PROC AR (KITS) ×3 IMPLANT
LABEL OR SOLS (LABEL) ×3 IMPLANT
NEEDLE FILTER BLUNT 18X 1/2SAF (NEEDLE) ×2
NEEDLE FILTER BLUNT 18X1 1/2 (NEEDLE) ×1 IMPLANT
NEEDLE HYPO 25X1 1.5 SAFETY (NEEDLE) ×6 IMPLANT
NS IRRIG 500ML POUR BTL (IV SOLUTION) ×3 IMPLANT
PACK EXTREMITY ARMC (MISCELLANEOUS) ×3 IMPLANT
STOCKINETTE STRL 6IN 960660 (GAUZE/BANDAGES/DRESSINGS) ×3 IMPLANT
SUT ETHILON 3-0 FS-10 30 BLK (SUTURE) ×3
SUT ETHILON 4-0 (SUTURE) ×2
SUT ETHILON 4-0 FS2 18XMFL BLK (SUTURE) ×1
SUT VIC AB 3-0 SH 27 (SUTURE) ×2
SUT VIC AB 3-0 SH 27X BRD (SUTURE) ×1 IMPLANT
SUT VIC AB 4-0 FS2 27 (SUTURE) ×3 IMPLANT
SUTURE EHLN 3-0 FS-10 30 BLK (SUTURE) ×1 IMPLANT
SUTURE ETHLN 4-0 FS2 18XMF BLK (SUTURE) ×1 IMPLANT
SYR 3ML LL SCALE MARK (SYRINGE) ×6 IMPLANT
SYRINGE 10CC LL (SYRINGE) ×3 IMPLANT

## 2016-05-13 NOTE — Progress Notes (Signed)
Pharmacy Antibiotic Note  Laura Kline is a 61 y.o. female admitted on 05/12/2016 with cellulitis and possible osteomyelitis. Pharmacy has been consulted for cefepime and vancomycin dosing. Pt received 1 dose metronidazole, aztreonam and vancomycin 1000 mg IV in ED. Spoke with admitting physician about low likelihood of cross-reactivity with patient's penicillin reaction and cefepime so will transition metronidazole/aztreonam to cefepime.  Plan: Continue cefepime 2 g IV q 12 hours  Scr improved therefore will change to Vancomycin 1250 mg IV q12 hours. Will order level to be drawn @ 1030 on 5/8.     Height: 5\' 3"  (160 cm) Weight: (!) 306 lb (138.8 kg) IBW/kg (Calculated) : 52.4  Temp (24hrs), Avg:98.1 F (36.7 C), Min:97.4 F (36.3 C), Max:98.9 F (37.2 C)   Recent Labs Lab 05/12/16 1130 05/12/16 1425 05/12/16 1542 05/13/16 0431  WBC 22.1*  --  17.5* 13.1*  CREATININE 1.39*  --  1.21* 1.11*  LATICACIDVEN 2.4* 1.1  --   --     Estimated Creatinine Clearance: 72.2 mL/min (A) (by C-G formula based on SCr of 1.11 mg/dL (H)).    Allergies  Allergen Reactions  . Rofecoxib Other (See Comments)  . Penicillins Rash    Antimicrobials this admission: Metronidazole, aztreonam 5/5 >> once vancomycin 5/5 >>  Cefepime 5/5 >>  Dose adjustments this admission:  Microbiology results: 5/5 BCx: sent 5/5 UCx: sent   Sputum:    MRSA PCR:   Thank you for allowing pharmacy to be a part of this patient's care.  Horris LatinoHolly Gilliam, PharmD Pharmacy Resident 05/13/2016 12:18 PM

## 2016-05-13 NOTE — Progress Notes (Signed)
Pt returned from pacu. She is awake alert and oriented and painfree. Family at bedside. lle dressing clean dry and intact. vss sable. Tele remains in a fib in the 90's.

## 2016-05-13 NOTE — Progress Notes (Signed)
Text Dr. Sheryle Hailiamond K+ 3.3 no replacement. No new orders at this time. Awaiting response.

## 2016-05-13 NOTE — Anesthesia Post-op Follow-up Note (Cosign Needed)
Anesthesia QCDR form completed.        

## 2016-05-13 NOTE — Transfer of Care (Signed)
Immediate Anesthesia Transfer of Care Note  Patient: Laura MiniumSabrina Delrosario  Procedure(s) Performed: Procedure(s): INCISION AND DRAINAGE (Left) REMOVAL FOREIGN BODY EXTREMITY (Left)  Patient Location: PACU  Anesthesia Type:General  Level of Consciousness: awake  Airway & Oxygen Therapy: Patient Spontanous Breathing and Patient connected to face mask oxygen  Post-op Assessment: Report given to RN and Post -op Vital signs reviewed and stable  Post vital signs: Reviewed  Last Vitals:  Vitals:   05/13/16 0910 05/13/16 0911  BP: 106/79 106/79  Pulse: 95 95  Resp: (!) 22 18  Temp: 36.6 C 36.6 C    Last Pain:  Vitals:   05/13/16 0739  TempSrc: Oral  PainSc: Asleep         Complications: No apparent anesthesia complications

## 2016-05-13 NOTE — Progress Notes (Signed)
Zoriana from specials called again asking if pt. had her cardiology consult yet I explained to her that Dr. Alvino ChapelIngal. Had not come in yet and I would pass it along to day nurse.

## 2016-05-13 NOTE — Progress Notes (Signed)
ANTICOAGULATION CONSULT NOTE - Initial Consult  Pharmacy Consult for apixaban Indication: atrial fibrillation  Allergies  Allergen Reactions  . Rofecoxib Other (See Comments)  . Penicillins Rash    Patient Measurements: Height: 5\' 3"  (160 cm) Weight: (!) 306 lb (138.8 kg) IBW/kg (Calculated) : 52.4  Vital Signs: Temp: 98.2 F (36.8 C) (05/06 1032) Temp Source: Axillary (05/06 1032) BP: 133/50 (05/06 1032) Pulse Rate: 94 (05/06 1032)  Labs:  Recent Labs  05/12/16 1130 05/12/16 1542 05/12/16 1920 05/12/16 2329 05/13/16 0431  HGB 11.6* 9.9*  --   --  9.3*  HCT 34.0* 29.4*  --   --  28.1*  PLT 284 229  --   --  203  CREATININE 1.39* 1.21*  --   --  1.11*  TROPONINI  --  <0.03 <0.03 <0.03  --     Estimated Creatinine Clearance: 72.2 mL/min (A) (by C-G formula based on SCr of 1.11 mg/dL (H)).   Medical History: Past Medical History:  Diagnosis Date  . Coronary atherosclerosis    LAD calcifications noted on CT scan in 2016 with negative nuclear stress test.  . DKA (diabetic ketoacidoses) (HCC)    a. 05/2009  . Essential hypertension   . History of Bell's palsy   . Hyperlipidemia   . Hypertension   . Hypokalemia   . IDDM (insulin dependent diabetes mellitus) (HCC)   . Long-term insulin use (HCC)   . Morbid obesity (HCC)   . Peripheral vascular disease [facility](HCC)      Assessment: Pharmacy consulted to dose apixaban for atrial fibrillation. Patient had surgery today and is currently in the PACU. This is a new start medication.  Goal of Therapy:  Monitor platelets by anticoagulation protocol: Yes   Plan:  Will order apixaban 5 mg PO BID to begin tomorrow morning since patient had surgery today. CBC and SCr to be checked at least every 72 hours per protocol.  Cindi CarbonMary M Fletcher Ostermiller, PharmD Clinical Pharmacist 05/13/2016,2:16 PM

## 2016-05-13 NOTE — Progress Notes (Signed)
[facility] broadcast[facility]urgical secretary just called and said Dr. Orland Jarredroxler would continue with the surgery without consult. She is scheduled with surgery at 0730.

## 2016-05-13 NOTE — Progress Notes (Signed)
[facility] [place] at [facility]   PATIENT NAME: Laura Kline    MR#:  [id number]  DATE OF BIRTH:  Feb 09, 1955  SUBJECTIVE:  CHIEF COMPLAINT:  Patient was seen after foot surgery. Resting comfortably but has some foot pain. Tolerating diet. Denies any palpitations  REVIEW OF SYSTEMS:  CONSTITUTIONAL: No fever, fatigue or weakness.  EYES: No blurred or double vision.  EARS, NOSE, AND THROAT: No tinnitus or ear pain.  RESPIRATORY: No cough, shortness of breath, wheezing or hemoptysis.  CARDIOVASCULAR: No chest pain, orthopnea, edema.  GASTROINTESTINAL: No nausea, vomiting, diarrhea or abdominal pain.  GENITOURINARY: No dysuria, hematuria.  ENDOCRINE: No polyuria, nocturia,  HEMATOLOGY: No anemia, easy bruising or bleeding SKIN: No rash or lesion. MUSCULOSKELETAL: No joint pain or arthritis.   NEUROLOGIC: No tingling, numbness, weakness.  PSYCHIATRY: No anxiety or depression.   DRUG ALLERGIES:   Allergies  Allergen Reactions  . Rofecoxib Other (See Comments)  . Penicillins Rash    VITALS:  Blood pressure (!) 133/50, pulse 94, temperature 98.2 F (36.8 C), temperature source Axillary, resp. rate 18, height 5\' 3"  (1.6 m), weight (!) 138.8 kg (306 lb), SpO2 100 %.  PHYSICAL EXAMINATION:  GENERAL:  61 y.o.-year-old patient lying in the bed with no acute distress.  EYES: Pupils equal, round, reactive to light and accommodation. No scleral icterus. Extraocular muscles intact.  HEENT: Head atraumatic, normocephalic. Oropharynx and nasopharynx clear.  NECK:  Supple, no jugular venous distention. No thyroid enlargement, no tenderness.  LUNGS: Normal breath sounds bilaterally, no wheezing, rales,rhonchi or crepitation. No use of accessory muscles of respiration.  CARDIOVASCULAR: Irregularly irregular. No murmurs, rubs, or gallops.  ABDOMEN: Soft, nontender, nondistended. Bowel sounds present. No organomegaly or mass.  EXTREMITIES: Left foot status post  surgery , including dressing No pedal edema, cyanosis, or clubbing.  NEUROLOGIC: Cranial nerves II through XII are intact. Muscle strength 5/5 in all extremities. Sensation intact. Gait not checked.  PSYCHIATRIC: The patient is alert and oriented x 3.  SKIN: No obvious rash, lesion, or ulcer.    LABORATORY PANEL:   CBC  Recent Labs Lab 05/13/16 0431  WBC 13.1*  HGB 9.3*  HCT 28.1*  PLT 203   ------------------------------------------------------------------------------------------------------------------  Chemistries   Recent Labs Lab 05/12/16 1130  05/13/16 0431  NA 132*  --  135  K 3.6  --  3.3*  CL 96*  --  108  CO2 22  --  22  GLUCOSE 255*  --  233*  BUN 22*  --  24*  CREATININE 1.39*  < > 1.11*  CALCIUM 8.3*  --  7.0*  AST 17  --   --   ALT 11*  --   --   ALKPHOS 56  --   --   BILITOT 1.2  --   --   < > = values in this interval not displayed. ------------------------------------------------------------------------------------------------------------------  Cardiac Enzymes  Recent Labs Lab 05/12/16 2329  TROPONINI <0.03   ------------------------------------------------------------------------------------------------------------------  RADIOLOGY:  Mr Foot Left W Wo Contrast  Result Date: 05/13/2016 CLINICAL DATA:  Increasing left foot pain and erythema over the last week. Cellulitis with foreign body and suspected sepsis. EXAM: MRI OF THE LEFT FOREFOOT WITHOUT AND WITH CONTRAST TECHNIQUE: Multiplanar, multisequence MR imaging of the left forefoot was performed both before and after administration of intravenous contrast. CONTRAST:  20mL MULTIHANCE GADOBENATE DIMEGLUMINE 529 MG/ML IV SOLN COMPARISON:  Radiographs 05/12/2016. FINDINGS: Bones/Joint/Cartilage The metallic foreign body within the first web states creates significant  magnetic susceptibility artifact, limiting evaluation of the first and second digits. Allowing for this artifact, there is no abnormal  marrow signal or enhancement. There is no evidence of cortical destruction, acute fracture or dislocation. No significant joint effusions or abnormal synovial enhancement demonstrated. Ligaments The Lisfranc ligament is intact. 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 There is mild T2 hyperintensity along the flexor tendons. No focal intramuscular fluid collections or suspicious enhancement seen. Soft tissues There is dorsal subcutaneous edema. There is an ill-defined fluid collection dorsal to the second and third metatarsal heads, measuring up to 2.3 cm in greatest dimension. This demonstrates no peripheral enhancement or overlying skin defect. No other focal fluid collections are seen. IMPRESSION: 1. Findings are consistent with dorsal forefoot cellulitis. There is a nonspecific small fluid collection within the dorsal subcutaneous fat without apparent overlying skin defect. 2. No evidence of deep abscess, osteomyelitis or septic joint. 3. Metallic foreign body within the first web space, creating moderate susceptibility artifact. Electronically Signed   By: Carey Bullocks M.D.   On: 05/13/2016 09:14   Dg Foot Complete Left  Result Date: 05/12/2016 CLINICAL DATA:  Left foot swelling. EXAM: LEFT FOOT - COMPLETE 3+ VIEW COMPARISON:  None. FINDINGS: There is no evidence of fracture or dislocation. There is no evidence of arthropathy. Spurring of posterior calcaneus is noted. Dorsal soft tissue swelling is noted concerning for cellulitis. Foreign body is seen in plantar soft tissues adjacent to second proximal phalanx. IMPRESSION: Radiopaque foreign body is noted in second toe. Dorsal soft tissue swelling is noted consistent with cellulitis. No fracture or dislocation is noted. Electronically Signed   By: Lupita Raider, M.D.   On: 05/12/2016 12:20    EKG:   Orders placed or performed during the hospital encounter of 05/12/16  . ED EKG  12-Lead  . ED EKG 12-Lead  . EKG 12-Lead  . EKG 12-Lead    ASSESSMENT AND PLAN:   61 year old female with past medical history of coronary artery disease, diabetes, peripheral vascular disease, hypertension, hyperlipidemia who presents to the hospital due to left lower extremity redness swelling and pain and noted to have cellulitis.  1. Sepsis-patient meets criteria given patient's elevated lactic acid, leukocytosis and left lower extremity findings suggestive of cellulitis. -Continue with broad-spectrum IV antibiotics with vancomycin, cefepime. -Follow blood, wound cultures. status post I  AND D of the left foot by podiatry  2. Left lower extremity cellulitis/possible osteomyelitis-this is the cause of patient's sepsis. - IV Vanco, Cefepime. Status post incision and drainage of the left foot follow-up with podiatry and cultures. MRI is negative for osteomyelitis -.   3. Atrial fibrillation with rapid ventricular response-this is new onset for the patient. Suspect this is probably initiated due to the underlying sepsis. -Continue Cardizem drip, will continue oral metoprolol to wean off the drip. -None trending cardiac biomarkers. Follow-up with cardiology. Pts CHADS2-VASc=3 recommending Eliquis 4. Acute kidney injury-secondary to sepsis, continue IV fluids, hold lisinopril/HCTZ. -Follow BUN/creatinine.  5. Hyperlipidemia-continue Crestor.  6. Leukocytosis-secondary to sepsis and cellulitis. Follow with IV antibiotic therapy.  7.Essential hypertension-we'll continue metoprolol.     All the records are reviewed and case discussed with Care Management/Social Workerr. Management plans discussed with the patient, family and they are in agreement.  CODE STATUS: FC   TOTAL TIME TAKING CARE OF THIS PATIENT: 36  minutes.   POSSIBLE D/C IN 2-3  DAYS, DEPENDING ON CLINICAL CONDITION.  Note: This  dictation was prepared with Dragon dictation along with smaller phrase technology.  Any transcriptional errors that result from this process are unintentional.   Ramonita LabGouru, Linette Gunderson M.D on 05/13/2016 at 1:53 PM  Between 7am to 6pm - Pager - [phone] After 6pm go to [email or web address] - password [email or web address]  Fabio Neighbors[facility]  Office  [phone]  CC: Primary care physician; Patient, No Pcp Per

## 2016-05-13 NOTE — Anesthesia Postprocedure Evaluation (Signed)
Anesthesia Post Note  Patient: Laura Kline  Procedure(s) Performed: Procedure(s) (LRB): INCISION AND DRAINAGE (Left) REMOVAL FOREIGN BODY EXTREMITY (Left)  Patient location during evaluation: PACU Anesthesia Type: General Level of consciousness: awake and alert and oriented Pain management: pain level controlled Vital Signs Assessment: post-procedure vital signs reviewed and stable Respiratory status: spontaneous breathing Cardiovascular status: blood pressure returned to baseline Anesthetic complications: no     Last Vitals:  Vitals:   05/13/16 0955 05/13/16 1032  BP: 114/74 (!) 133/50  Pulse: 92 94  Resp: (!) 25 18  Temp: 37.2 C 36.8 C    Last Pain:  Vitals:   05/13/16 1032  TempSrc: Axillary  PainSc:                  Brandee Markin

## 2016-05-13 NOTE — Consult Note (Signed)
Cardiology Consultation   Patient ID: Laura MiniumSabrina Conklin; [id number]; 01/26/1955   Admit date: 05/12/2016 Date of Consult: 05/13/2016  Referring MD:  Dr. Cherlynn KaiserSainani Patient Care Team: Patient, No Pcp Per as PCP - General (General Practice)    Reason for Consultation: Atrial fibrillation   History of Present Illness: Laura Kline is a 61 y.o. female with a hx of DM, coronary calcification (noted on CT 2016), obesity, HTN, Hyperlipidemia  Last seen by Dr. Kirke CorinArida in 2016  Presented 05/12/2016 to ER for LLE redness, swelling, pain. Diagnosed to have sepsis/cellulitis. Note to have Afib with RVR, 160s on EKG. Cardiology consulted.   Patient's main complaint was pain on the left foot. Chills at home. No documented fever. Patient denied chest pain. Shortness of breath with exertion, for years no progression recently. No lightheadedness or dizziness. No palpitations.    ROS:  Please see the history of present illness.  ROS All other ROS reviewed and negative.    Past Medical History:  Diagnosis Date  . Coronary atherosclerosis    LAD calcifications noted on CT scan in 2016 with negative nuclear stress test.  . DKA (diabetic ketoacidoses) (HCC)    a. 05/2009  . Essential hypertension   . History of Bell's palsy   . Hyperlipidemia   . Hypertension   . Hypokalemia   . IDDM (insulin dependent diabetes mellitus) (HCC)   . Long-term insulin use (HCC)   . Morbid obesity (HCC)   . Peripheral vascular disease [facility](HCC)     Past Surgical History:  Procedure Laterality Date  . CATARACT EXTRACTION W/PHACO Left 02/08/2015   Procedure: CATARACT EXTRACTION PHACO AND INTRAOCULAR LENS PLACEMENT (IOC);  Surgeon: Galen ManilaWilliam Porfilio, MD;  Location: [facility] ORS;  Service: Ophthalmology;  Laterality: Left;  US 01:33 AP% 15.9 CDE 14.94 flyuid pack lot # [id number] H  . CESAREAN SECTION     x2  . EYE SURGERY Right    Cataract Extraction with IOL      Home Meds: Prior to Admission medications   Medication Sig  Start Date End Date Taking? Authorizing Provider  aspirin EC 81 MG tablet Take 81 mg by mouth daily.   Yes [provider]  insulin aspart (NOVOLOG FLEXPEN) 100 UNIT/ML FlexPen Inject 10-16 Units into the skin 3 (three) times daily with meals. Per sliding scale   Yes [provider]  Insulin Glargine (LANTUS SOLOSTAR) 100 UNIT/ML Solostar Pen Inject 58 Units into the skin at bedtime.    Yes [provider]  lisinopril-hydrochlorothiazide (PRINZIDE,ZESTORETIC) 20-25 MG tablet Take 1 tablet by mouth daily. 10/07/14  Yes [place] OuchArida, Muhammad A, MD  metFORMIN (GLUCOPHAGE-XR) 500 MG 24 hr tablet Take 2 tablets by mouth daily. With dinner   Yes [provider]  metoprolol (LOPRESSOR) 50 MG tablet Take 1 tablet (50 mg total) by mouth 2 (two) times daily. 10/07/14  Yes [place] OuchArida, Muhammad A, MD  rosuvastatin (CRESTOR) 10 MG tablet Take 1 tablet (10 mg total) by mouth daily after supper. 10/07/14  Yes Iran OuchArida, Muhammad A, MD    Current Medications: . aspirin EC  81 mg Oral Daily  . diltiazem      . enoxaparin (LOVENOX) injection  40 mg Subcutaneous Q12H  . fentaNYL      . insulin aspart  0-5 Units Subcutaneous QHS  . insulin aspart  0-9 Units Subcutaneous TID WC  . insulin glargine  58 Units Subcutaneous QHS  . metoprolol  50 mg Oral BID  . rosuvastatin  10 mg Oral QPC supper  Allergies:    Allergies  Allergen Reactions  . Rofecoxib Other (See Comments)  . Penicillins Rash    Social History:   The patient  reports that she has never smoked. She has never used smokeless tobacco. She reports that she does not drink alcohol or use drugs.    Family History:   The patient's family history includes CAD in her father; Heart attack in her father; Heart failure in her father; Hypertension in her father and mother.       PHYSICAL EXAM: VS:  BP (!) 133/50 (BP Location: Left Arm)   Pulse 94   Temp 98.2 F (36.8 C) (Axillary)   Resp 18   Ht 5\' 3"  (1.6 m)   Wt (!) 306 lb  (138.8 kg)   SpO2 100%   BMI 54.21 kg/m  , BMI Body mass index is 54.21 kg/m. GENERAL:  well developed, well nourished, Morbidly obese, not in acute distress HEENT: normocephalic, pink conjunctivae, anicteric sclerae, no xanthelasma, normal dentition, oropharynx clear NECK:  no neck vein engorgement, JVP normal, no hepatojugular reflux, carotid upstroke brisk and symmetric, no bruit, no thyromegaly, no lymphadenopathy LUNGS:  good respiratory effort, clear to auscultation bilaterally CV:  PMI not displaced, no thrills, no lifts, irregular S2 within normal limits, no palpable S3 or S4, no murmurs, no rubs, no gallops ABD:  Soft, nontender, nondistended, normoactive bowel sounds, no abdominal aortic bruit MS: nontender back, no kyphosis, no scoliosis, no joint deformities EXT:  2+ DP/PT pulses, no edema, no varicosities, no cyanosis, no clubbing, left foot wrapped in bandage SKIN: warm, nondiaphoretic, normal turgor, no ulcers NEUROPSYCH: alert, oriented to person, place, and time, sensory/motor grossly intact, normal mood, appropriate affect    EKG:  Personally reviewed: 05/12/2016 Afib RVR 160  Telemetry afib 90s ventricular rate  Labs:  Recent Labs  05/12/16 1542 05/12/16 1920 05/12/16 2329  TROPONINI <0.03 <0.03 <0.03   Lab Results  Component Value Date   WBC 13.1 (H) 05/13/2016   HGB 9.3 (L) 05/13/2016   HCT 28.1 (L) 05/13/2016   MCV 94.6 05/13/2016   PLT 203 05/13/2016    Recent Labs Lab 05/12/16 1130  05/13/16 0431  NA 132*  --  135  K 3.6  --  3.3*  CL 96*  --  108  CO2 22  --  22  BUN 22*  --  24*  CREATININE 1.39*  < > 1.11*  CALCIUM 8.3*  --  7.0*  PROT 7.7  --   --   BILITOT 1.2  --   --   ALKPHOS 56  --   --   ALT 11*  --   --   AST 17  --   --   GLUCOSE 255*  --  233*  < > = values in this interval not displayed. Lab Results  Component Value Date   CHOL 214 (H) 09/02/2014   HDL 41 09/02/2014   LDLCALC 125 (H) 09/02/2014   TRIG 242 (H)  09/02/2014   No results found for: DDIMER  Radiology/Studies:  Mr Foot Left W Wo Contrast  Result Date: 05/13/2016 CLINICAL DATA:  Increasing left foot pain and erythema over the last week. Cellulitis with foreign body and suspected sepsis. EXAM: MRI OF THE LEFT FOREFOOT WITHOUT AND WITH CONTRAST TECHNIQUE: Multiplanar, multisequence MR imaging of the left forefoot was performed both before and after administration of intravenous contrast. CONTRAST:  20mL MULTIHANCE GADOBENATE DIMEGLUMINE 529 MG/ML IV SOLN COMPARISON:  Radiographs 05/12/2016. FINDINGS: Bones/Joint/Cartilage The  metallic foreign body within the first web states creates significant magnetic susceptibility artifact, limiting evaluation of the first and second digits. Allowing for this artifact, there is no abnormal marrow signal or enhancement. There is no evidence of cortical destruction, acute fracture or dislocation. No significant joint effusions or abnormal synovial enhancement demonstrated. Ligaments The Lisfranc ligament is intact. 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 There is mild T2 hyperintensity along the flexor tendons. No focal intramuscular fluid collections or suspicious enhancement seen. Soft tissues There is dorsal subcutaneous edema. There is an ill-defined fluid collection dorsal to the second and third metatarsal heads, measuring up to 2.3 cm in greatest dimension. This demonstrates no peripheral enhancement or overlying skin defect. No other focal fluid collections are seen. IMPRESSION: 1. Findings are consistent with dorsal forefoot cellulitis. There is a nonspecific small fluid collection within the dorsal subcutaneous fat without apparent overlying skin defect. 2. No evidence of deep abscess, osteomyelitis or septic joint. 3. Metallic foreign body within the first web space, creating moderate susceptibility artifact. Electronically Signed   By:  Carey Bullocks M.D.   On: 05/13/2016 09:14   Dg Foot Complete Left  Result Date: 05/12/2016 CLINICAL DATA:  Left foot swelling. EXAM: LEFT FOOT - COMPLETE 3+ VIEW COMPARISON:  None. FINDINGS: There is no evidence of fracture or dislocation. There is no evidence of arthropathy. Spurring of posterior calcaneus is noted. Dorsal soft tissue swelling is noted concerning for cellulitis. Foreign body is seen in plantar soft tissues adjacent to second proximal phalanx. IMPRESSION: Radiopaque foreign body is noted in second toe. Dorsal soft tissue swelling is noted consistent with cellulitis. No fracture or dislocation is noted. Electronically Signed   By: Lupita Raider, M.D.   On: 05/12/2016 12:20     PROBLEM LIST:  Active Problems:   Sepsis (HCC)     ASSESSMENT AND PLAN:  Atrial fibrillation, rapid ventricular response, new diagnosis In setting of sepsis/cellulitis Heart rate improved on Cardizem drip Await echocardiogram. If EF preserved, can switch to oral Cardizem 30mg  4 times a day. Discussed stroke risk reduction. CHADS2-VASc=3. Once able, may switch to Eliquis 5 mg twice a day for oral anticoagulation. Discussed risks and benefits of oral anticoagulation. Patient verbalized understanding. Recommend to check TSH and free T4. Consider sleep study as an outpatient. Nuclear stress test from 2016 was negative for ischemia.  Coronary atherosclerosis Recommend risk factor modification: Diabetes control, hypertension, hyperlipidemia, obesity May discontinue aspirin if patient on Eliquis consider statin therapy. dl goal < 70  HTN BP is well controlled. Continue monitoring BP. Continue current medical therapy and lifestyle changes.   I spent at least 80 minutes with the patient today and more than 50% of the time was spent counseling the patient and coordinating care.    Signed, Almond Lint, MD  05/13/2016 11:54 AM  [facility]

## 2016-05-13 NOTE — Progress Notes (Signed)
Dr. Sheryle Hailiamond on floor no new orders for K+ at this time.

## 2016-05-13 NOTE — Op Note (Signed)
Operative note   Surgeon: Dr. Recardo EvangelistMatthew Natalio Salois, DPM.    Assistant: None    Preop diagnosis: Cellulitis and abscess to the left foot secondary to foreign body on plantar left foot    Postop diagnosis: Same    Procedure:   1. Excision drainage cellulitis abscess left foot at second metatarsophalangeal joint          EBL: 10 mL    Anesthesia:IV sedation delivered by anesthesia team approximately 15 cc of lidocaine and Marcaine mix was injected by me.    Hemostasis: None    Specimen: Aerobic and anaerobic culture    Complications: None    Operative indications: Cellulitis abscess with (foreign body and superficial skin plantarly on the foot    Procedure:  Patient was brought into the OR and placed on the operating table in thesupine position. After anesthesia was obtained theleft lower extremity was prepped and draped in usual sterile fashion.  Operative Report: This time attention was directed to the left foot initially the plantar aspect of foot was identified there was a small metal object protruding from the original wound this was removed and photographed.. To be a metallic or graphite-type material approximately 3-4 mm in length and 2 mm in width. Incision was made through the penetration site of this area approximately 2 cm. This was deepened with blunt dissection and seen to be tracking dorsally. This time attention was directed to the dorsum of the wound where more skin structure damage was noted with the abscess. A 3 cm linear incision was made over this region from the distal shaft metatarsal across metatarsophalangeal joint to the dorsum of the proximal phalanx. Significant. Drainage was encountered at this point proximally 10 cc of pus was drained from the wound. The wound Was deepened with blunt dissection and there is sinus tracking from the dorsal incision margin medially around the base of the proximal phalanx extending down to the plantar wound. She also an abscess about  halfway up that medial side of the toe that was opened as well. This point the versa jet was set on 2 and used to debride any infected tissue. After this was accomplished both dorsally medially and plantarly the area was copiously irrigated with a pulsed lavage irrigation system. This is done both dorsally and plantarly as well. At this point a plantar incision was closed with 3-0 nylon simple ruptured sutures. Dorsal incision was closed partially with central portion being left open. Packing was placed in the dorsal incision and carried to the medial open wound and exiting there. A sterile dressing was placed across the area at this time consisting of 4 x 4's conformation Kerlix and an Ace wrap.    Patient tolerated the procedure and anesthesia well.  Was transported from the OR to the PACU with all vital signs stable and vascular status intact. To be discharged per routine protocol.  Will follow up in approximately 1 week in the outpatient clinic.

## 2016-05-13 NOTE — Progress Notes (Signed)
Pt. Slept well throughout the night getting up only to use BSC with 1 assist. Pt's HR would increase into 130's with activity at rest it fluctuated between 90's to low one hundreds. Pt has been NPO since midnight.

## 2016-05-13 NOTE — Progress Notes (Addendum)
Specials Martie Lee([phone])  called asking if pt. Had cardiology consult, I told her it had been assigned to Dr. Alvino ChapelIngal but I did not see where she had consulted with pt. Specials Martie Lee(Glorious)  said she would inform Dr. Noralyn Pickarroll and Dr. Orland Jarredroxler and they would  Return a call to floor letting us know if pt. Would be able to have her surgery this morning or if she would have to wait until the consult is complete related to her new onset of a-fib.

## 2016-05-14 ENCOUNTER — Inpatient Hospital Stay (HOSPITAL_COMMUNITY)
Admit: 2016-05-14 | Discharge: 2016-05-14 | Disposition: A | Discharge status: Home / Self Care | Payer: BLUE CROSS/BLUE SHIELD | Attending: Specialist | Admitting: Specialist

## 2016-05-14 DIAGNOSIS — I10 Essential (primary) hypertension: Secondary | ICD-10-CM

## 2016-05-14 DIAGNOSIS — I2510 Atherosclerotic heart disease of native coronary artery without angina pectoris: Secondary | ICD-10-CM

## 2016-05-14 DIAGNOSIS — I481 Persistent atrial fibrillation: Secondary | ICD-10-CM

## 2016-05-14 LAB — CBC WITH DIFFERENTIAL/PLATELET
Basophils Absolute: 0 10*3/uL (ref 0–0.1)
Basophils Relative: 0 %
EOS ABS: 0.2 10*3/uL (ref 0–0.7)
Eosinophils Relative: 2 %
HEMATOCRIT: 27.7 % — AB (ref 35.0–47.0)
HEMOGLOBIN: 9.3 g/dL — AB (ref 12.0–16.0)
LYMPHS ABS: 1.5 10*3/uL (ref 1.0–3.6)
Lymphocytes Relative: 13 %
MCH: 32.7 pg (ref 26.0–34.0)
MCHC: 33.8 g/dL (ref 32.0–36.0)
MCV: 96.8 fL (ref 80.0–100.0)
Monocytes Absolute: 1.1 10*3/uL — ABNORMAL HIGH (ref 0.2–0.9)
Monocytes Relative: 10 %
NEUTROS ABS: 8.7 10*3/uL — AB (ref 1.4–6.5)
NEUTROS PCT: 75 %
Platelets: 212 10*3/uL (ref 150–440)
RBC: 2.86 MIL/uL — AB (ref 3.80–5.20)
RDW: 13.2 % (ref 11.5–14.5)
WBC: 11.6 10*3/uL — AB (ref 3.6–11.0)

## 2016-05-14 LAB — BASIC METABOLIC PANEL
Anion gap: 7 (ref 5–15)
BUN: 24 mg/dL — AB (ref 6–20)
CHLORIDE: 109 mmol/L (ref 101–111)
CO2: 21 mmol/L — AB (ref 22–32)
Calcium: 7 mg/dL — ABNORMAL LOW (ref 8.9–10.3)
Creatinine, Ser: 1.09 mg/dL — ABNORMAL HIGH (ref 0.44–1.00)
GFR calc non Af Amer: 53 mL/min — ABNORMAL LOW (ref >60)
Glucose, Bld: 216 mg/dL — ABNORMAL HIGH (ref 65–99)
POTASSIUM: 3.5 mmol/L (ref 3.5–5.1)
SODIUM: 137 mmol/L (ref 135–145)

## 2016-05-14 LAB — TSH: TSH: 1.124 u[IU]/mL (ref 0.350–4.500)

## 2016-05-14 LAB — GLUCOSE, CAPILLARY
GLUCOSE-CAPILLARY: 151 mg/dL — AB (ref 65–99)
Glucose-Capillary: 147 mg/dL — ABNORMAL HIGH (ref 65–99)
Glucose-Capillary: 144 mg/dL — ABNORMAL HIGH (ref 65–99)
Glucose-Capillary: 162 mg/dL — ABNORMAL HIGH (ref 65–99)

## 2016-05-14 LAB — MAGNESIUM: Magnesium: 1.5 mg/dL — ABNORMAL LOW (ref 1.7–2.4)

## 2016-05-14 LAB — ECHOCARDIOGRAM COMPLETE
HEIGHTINCHES: 63 in
Weight: 5060.8 oz

## 2016-05-14 MED ORDER — POTASSIUM CHLORIDE CRYS ER 20 MEQ PO TBCR
40.0000 meq | EXTENDED_RELEASE_TABLET | Freq: Once | ORAL | Status: AC
  Administered 2016-05-14: 40 meq via ORAL
  Filled 2016-05-14: qty 2

## 2016-05-14 MED ORDER — HYDROCODONE-ACETAMINOPHEN 7.5-325 MG PO TABS
1.0000 | ORAL_TABLET | Freq: Four times a day (QID) | ORAL | Status: DC | PRN
  Administered 2016-05-14 – 2016-05-17 (×4): 1 via ORAL
  Filled 2016-05-14 (×4): qty 1

## 2016-05-14 NOTE — Progress Notes (Signed)
Patient Name: Laura Kline Date of Encounter: 05/14/2016  Primary Cardiologist: [facility] Problem List     Active Problems:   Sepsis [facility])     Subjective   No complaints. No chest pain or SOB. Remains in Afib with heart rates in the 90s bpm. Awaiting TTE. WBC count improving. K+ remains slightly low though improving.   Inpatient Medications    Scheduled Meds: . apixaban  5 mg Oral BID  . aspirin EC  81 mg Oral Daily  . insulin aspart  0-5 Units Subcutaneous QHS  . insulin aspart  0-9 Units Subcutaneous TID WC  . insulin glargine  58 Units Subcutaneous QHS  . metoprolol  50 mg Oral BID  . rosuvastatin  10 mg Oral QPC supper   Continuous Infusions: . sodium chloride 100 mL/hr at 05/14/16 0357  . ceFEPime (MAXIPIME) IV Stopped (05/13/16 2158)  . diltiazem (CARDIZEM) infusion 5 mg/hr (05/13/16 2222)  . vancomycin Stopped (05/13/16 2257)   PRN Meds: acetaminophen **OR** acetaminophen, ondansetron **OR** ondansetron (ZOFRAN) IV   Vital Signs    Vitals:   05/13/16 0955 05/13/16 1032 05/13/16 1937 05/14/16 0340  BP: 114/74 (!) 133/50 123/62 117/83  Pulse: 92 94 (!) 101 92  Resp: (!) 25 18 18 18   Temp: 98.9 F (37.2 C) 98.2 F (36.8 C) 98.5 F (36.9 C) 98.3 F (36.8 C)  TempSrc:  Axillary Oral Oral  SpO2: 100% 100% 99% 96%  Weight:    (!) 316 lb 4.8 oz (143.5 kg)  Height:        Intake/Output Summary (Last 24 hours) at 05/14/16 0807 Last data filed at 05/14/16 0341  Gross per 24 hour  Intake              840 ml  Output              600 ml  Net              240 ml   Filed Weights   05/12/16 1130 05/12/16 1529 05/14/16 0340  Weight: (!) 305 lb (138.3 kg) (!) 306 lb (138.8 kg) (!) 316 lb 4.8 oz (143.5 kg)    Physical Exam    GEN: Well nourished, well developed, in no acute distress.  HEENT: Grossly normal.  Neck: Supple, no JVD, carotid bruits, or masses. Cardiac: Irregularly irregular, no murmurs, rubs, or gallops. No clubbing, cyanosis, edema.   LLE dressed and wrapped.  Respiratory:  Respirations regular and unlabored, clear to auscultation bilaterally. GI: Obese, soft, nontender, nondistended, BS + x 4. MS: no deformity or atrophy. Skin: warm and dry, no rash. Neuro:  Strength and sensation are intact. Psych: AAOx3.  Normal affect.  Labs    CBC  Recent Labs  05/12/16 1130  05/13/16 0431 05/14/16 0009  WBC 22.1*  < > 13.1* 11.6*  NEUTROABS 18.9*  --   --  8.7*  HGB 11.6*  < > 9.3* 9.3*  HCT 34.0*  < > 28.1* 27.7*  MCV 95.1  < > 94.6 96.8  PLT 284  < > 203 212  < > = values in this interval not displayed. Basic Metabolic Panel  Recent Labs  05/13/16 0431 05/14/16 0009  NA 135 137  K 3.3* 3.5  CL 108 109  CO2 22 21*  GLUCOSE 233* 216*  BUN 24* 24*  CREATININE 1.11* 1.09*  CALCIUM 7.0* 7.0*   Liver Function Tests  Recent Labs  05/12/16 1130  AST 17  ALT 11*  ALKPHOS 56  BILITOT 1.2  PROT 7.7  ALBUMIN 3.2*   No results for input(s): LIPASE, AMYLASE in the last 72 hours. Cardiac Enzymes  Recent Labs  05/12/16 1542 05/12/16 1920 05/12/16 2329  TROPONINI <0.03 <0.03 <0.03   BNP Invalid input(s): POCBNP D-Dimer No results for input(s): DDIMER in the last 72 hours. Hemoglobin A1C No results for input(s): HGBA1C in the last 72 hours. Fasting Lipid Panel No results for input(s): CHOL, HDL, LDLCALC, TRIG, CHOLHDL, LDLDIRECT in the last 72 hours. Thyroid Function Tests No results for input(s): TSH, T4TOTAL, T3FREE, THYROIDAB in the last 72 hours.  Invalid input(s): FREET3  Telemetry    Afib, 90s bpm - Personally Reviewed  ECG    n/a - Personally Reviewed  Radiology    Mr Foot Left W Wo Contrast  Result Date: 05/13/2016 CLINICAL DATA:  Increasing left foot pain and erythema over the last week. Cellulitis with foreign body and suspected sepsis. EXAM: MRI OF THE LEFT FOREFOOT WITHOUT AND WITH CONTRAST TECHNIQUE: Multiplanar, multisequence MR imaging of the left forefoot was performed both  before and after administration of intravenous contrast. CONTRAST:  20mL MULTIHANCE GADOBENATE DIMEGLUMINE 529 MG/ML IV SOLN COMPARISON:  Radiographs 05/12/2016. FINDINGS: Bones/Joint/Cartilage The metallic foreign body within the first web states creates significant magnetic susceptibility artifact, limiting evaluation of the first and second digits. Allowing for this artifact, there is no abnormal marrow signal or enhancement. There is no evidence of cortical destruction, acute fracture or dislocation. No significant joint effusions or abnormal synovial enhancement demonstrated. Ligaments The Lisfranc ligament is intact. 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 There is mild T2 hyperintensity along the flexor tendons. No focal intramuscular fluid collections or suspicious enhancement seen. Soft tissues There is dorsal subcutaneous edema. There is an ill-defined fluid collection dorsal to the second and third metatarsal heads, measuring up to 2.3 cm in greatest dimension. This demonstrates no peripheral enhancement or overlying skin defect. No other focal fluid collections are seen. IMPRESSION: 1. Findings are consistent with dorsal forefoot cellulitis. There is a nonspecific small fluid collection within the dorsal subcutaneous fat without apparent overlying skin defect. 2. No evidence of deep abscess, osteomyelitis or septic joint. 3. Metallic foreign body within the first web space, creating moderate susceptibility artifact. Electronically Signed   By: Carey Bullocks M.D.   On: 05/13/2016 09:14   Dg Foot Complete Left  Result Date: 05/12/2016 CLINICAL DATA:  Left foot swelling. EXAM: LEFT FOOT - COMPLETE 3+ VIEW COMPARISON:  None. FINDINGS: There is no evidence of fracture or dislocation. There is no evidence of arthropathy. Spurring of posterior calcaneus is noted. Dorsal soft tissue swelling is noted concerning for cellulitis. Foreign  body is seen in plantar soft tissues adjacent to second proximal phalanx. IMPRESSION: Radiopaque foreign body is noted in second toe. Dorsal soft tissue swelling is noted consistent with cellulitis. No fracture or dislocation is noted. Electronically Signed   By: Lupita Raider, M.D.   On: 05/12/2016 12:20    Cardiac Studies   TTE pending  Patient Profile     61 y.o. female with history of DM, coronary calcification (noted on CT 2016), obesity, HTN, hyperlipidemia who presented to [facility] with LLE cellulitis and was found to be in new onset Afib with RVR.  Assessment & Plan    1. New onset Afib with RVR: -Remains in Afib -Rate controlled in the 90s bpm -TTE pending -Wean Cardizem gtt -Currently on  Lopressot 50 mg bid, if needed for rate control while stopping Cardizem gtt could change to metoprolol 50 q 8 hours as BP tolerates -Eliquis 5 mg bid -Consider outpatient sleep study -Recent nuclear stress test in 2016 negative -Likely in the setting of her LLE cellulitis -Check TSH  2. Coronary atherosclerosis: -No chest pain -Recommend risk factor modification: Diabetes control, hypertension, hyperlipidemia, obesity -Eliquis in place of ASA -Crestor   3. HTN: -Continue Lopressor as above  4. Hypokalemia: -Replete to goal of 4.0 -Check magnesium    Signed, Eula ListenRyan Eschol Auxier, PA-C [facility] Pager: [phone] 05/14/2016, 8:07 AM

## 2016-05-14 NOTE — Progress Notes (Signed)
Pt. Slept throughout the night. Pt has stayed on bed rest using bedpan to void. C/o pain x1 pt. Medicated with effective results. IV to left forearm infiltrated, IV removed and warm compress placed on site.

## 2016-05-14 NOTE — Progress Notes (Signed)
[facility] [place] [facility]   PATIENT NAME: Laura MiniumSabrina Chick    MR#:  [id number]  DATE OF BIRTH:  11/18/1955  SUBJECTIVE:  CHIEF COMPLAINT:  Patient resting comfortably Tolerating diet. Denies any palpitations  REVIEW OF SYSTEMS:  CONSTITUTIONAL: No fever, fatigue or weakness.  EYES: No blurred or double vision.  EARS, NOSE, AND THROAT: No tinnitus or ear pain.  RESPIRATORY: No cough, shortness of breath, wheezing or hemoptysis.  CARDIOVASCULAR: No chest pain, orthopnea, edema.  GASTROINTESTINAL: No nausea, vomiting, diarrhea or abdominal pain.  GENITOURINARY: No dysuria, hematuria.  ENDOCRINE: No polyuria, nocturia,  HEMATOLOGY: No anemia, easy bruising or bleeding SKIN: No rash or lesion. MUSCULOSKELETAL: No joint pain or arthritis.   NEUROLOGIC: No tingling, numbness, weakness.  PSYCHIATRY: No anxiety or depression.   DRUG ALLERGIES:   Allergies  Allergen Reactions  . Rofecoxib Other (See Comments)  . Penicillins Rash    VITALS:  Blood pressure 117/83, pulse 92, temperature 98.3 F (36.8 C), temperature source Oral, resp. rate 18, height 5\' 3"  (1.6 m), weight (!) 143.5 kg (316 lb 4.8 oz), SpO2 96 %.  PHYSICAL EXAMINATION:  GENERAL:  61 y.o.-year-old patient lying in the bed with no acute distress.  EYES: Pupils equal, round, reactive to light and accommodation. No scleral icterus. Extraocular muscles intact.  HEENT: Head atraumatic, normocephalic. Oropharynx and nasopharynx clear.  NECK:  Supple, no jugular venous distention. No thyroid enlargement, no tenderness.  LUNGS: Normal breath sounds bilaterally, no wheezing, rales,rhonchi or crepitation. No use of accessory muscles of respiration.  CARDIOVASCULAR: Irregularly irregular. No murmurs, rubs, or gallops.  ABDOMEN: Soft, nontender, nondistended. Bowel sounds present. No organomegaly or mass.  EXTREMITIES: Left foot status post surgery , including dressing No pedal edema, cyanosis, or  clubbing.  NEUROLOGIC: Cranial nerves II through XII are intact. Muscle strength 5/5 in all extremities. Sensation intact. Gait not checked.  PSYCHIATRIC: The patient is alert and oriented x 3.  SKIN: No obvious rash, lesion, or ulcer.    LABORATORY PANEL:   CBC  Recent Labs Lab 05/14/16 0009  WBC 11.6*  HGB 9.3*  HCT 27.7*  PLT 212   ------------------------------------------------------------------------------------------------------------------  Chemistries   Recent Labs Lab 05/12/16 1130  05/14/16 0009  NA 132*  < > 137  K 3.6  < > 3.5  CL 96*  < > 109  CO2 22  < > 21*  GLUCOSE 255*  < > 216*  BUN 22*  < > 24*  CREATININE 1.39*  < > 1.09*  CALCIUM 8.3*  < > 7.0*  MG  --   --  1.5*  AST 17  --   --   ALT 11*  --   --   ALKPHOS 56  --   --   BILITOT 1.2  --   --   < > = values in this interval not displayed. ------------------------------------------------------------------------------------------------------------------  Cardiac Enzymes  Recent Labs Lab 05/12/16 2329  TROPONINI <0.03   ------------------------------------------------------------------------------------------------------------------  RADIOLOGY:  Mr Foot Left W Wo Contrast  Result Date: 05/13/2016 CLINICAL DATA:  Increasing left foot pain and erythema over the last week. Cellulitis with foreign body and suspected sepsis. EXAM: MRI OF THE LEFT FOREFOOT WITHOUT AND WITH CONTRAST TECHNIQUE: Multiplanar, multisequence MR imaging of the left forefoot was performed both before and after administration of intravenous contrast. CONTRAST:  20mL MULTIHANCE GADOBENATE DIMEGLUMINE 529 MG/ML IV SOLN COMPARISON:  Radiographs 05/12/2016. FINDINGS: Bones/Joint/Cartilage The metallic foreign body within the first web states creates significant  magnetic susceptibility artifact, limiting evaluation of the first and second digits. Allowing for this artifact, there is no abnormal marrow signal or enhancement. There  is no evidence of cortical destruction, acute fracture or dislocation. No significant joint effusions or abnormal synovial enhancement demonstrated. Ligaments The Lisfranc ligament is intact. 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 There is mild T2 hyperintensity along the flexor tendons. No focal intramuscular fluid collections or suspicious enhancement seen. Soft tissues There is dorsal subcutaneous edema. There is an ill-defined fluid collection dorsal to the second and third metatarsal heads, measuring up to 2.3 cm in greatest dimension. This demonstrates no peripheral enhancement or overlying skin defect. No other focal fluid collections are seen. IMPRESSION: 1. Findings are consistent with dorsal forefoot cellulitis. There is a nonspecific small fluid collection within the dorsal subcutaneous fat without apparent overlying skin defect. 2. No evidence of deep abscess, osteomyelitis or septic joint. 3. Metallic foreign body within the first web space, creating moderate susceptibility artifact. Electronically Signed   By: Carey Bullocks M.D.   On: 05/13/2016 09:14    EKG:   Orders placed or performed during the hospital encounter of 05/12/16  . ED EKG 12-Lead  . ED EKG 12-Lead  . EKG 12-Lead  . EKG 12-Lead    ASSESSMENT AND PLAN:   61 year old female with past medical history of coronary artery disease, diabetes, peripheral vascular disease, hypertension, hyperlipidemia who presents to the hospital due to left lower extremity redness swelling and pain and noted to have cellulitis.  1. Sepsis-patient meets criteria given patient's elevated lactic acid, leukocytosis and left lower extremity findings suggestive of cellulitis. -Continue with broad-spectrum IV antibiotics with vancomycin, cefepime. -blood cultures negative for 2 days, wound culturesWith abundant gram-positive cocci. status post I  AND D of the left foot by  podiatry -Urine culture pending  2. Left lower extremity cellulitis/possible osteomyelitis-this is the cause of patient's sepsis. - IV Vanco, Cefepime. Status post incision and drainage of the left foot follow-up with podiatry and cultures. MRI is negative for osteomyelitis -. Wound packing changed today and agree packed with wet-to-dry saline gauze. Follow up on cultures  3. Atrial fibrillation with rapid ventricular response-this is new onset for the patient. Suspect this is probably initiated due to the underlying sepsis. -Continue Cardizem drip, wean off as tolerated  will continue oral metoprolol to wean off the drip cardiac is recommending to change to metoprolol 50 mg by mouth every 8 hours as blood pressure tolerates -None trending cardiac biomarkers. Follow-up with cardiology. Pts CHADS2-VASc=3 started Eliquis 5 mg twice a day   4. Acute kidney injury-secondary to sepsis, continue IV fluids, hold lisinopril/HCTZ. -Follow BUN/creatinine creatinine 1.11-1.09   5. Hyperlipidemia-continue Crestor.  6. Leukocytosis-secondary to sepsis and cellulitis. Follow with IV antibiotic therapy.  7.Essential hypertension-we'll continue metoprolol.     All the records are reviewed and case discussed with Care Management/Social Workerr. Management plans discussed with the patient, family and they are in agreement.  CODE STATUS: FC   TOTAL TIME TAKING CARE OF THIS PATIENT: 36  minutes.   POSSIBLE D/C IN 2-3  DAYS, DEPENDING ON CLINICAL CONDITION.  Note: This dictation was prepared with Dragon dictation along with smaller phrase technology. Any transcriptional errors that result from this process are unintentional.   Ramonita Lab M.D on 05/14/2016 at 3:33 PM  Between 7am to 6pm - Pager - [phone] After 6pm go to [email or web address] - password [email or web address] [facility]  [facility]  Office  [phone]  CC: Primary care physician; Patient, No Pcp Per

## 2016-05-14 NOTE — Progress Notes (Signed)
Patient Demographics  Laura Kline, is a 61 y.o. female   MRN: [id number]   DOB - 04/13/1955  Admit Date - 05/12/2016    Outpatient Primary MD for the patient is Patient, No Pcp Per  Consult requested in the Hospital by Ramonita Lab, MD, On 05/14/2016     With History of -  Past Medical History:  Diagnosis Date  . Coronary atherosclerosis    LAD calcifications noted on CT scan in 2016 with negative nuclear stress test.  . DKA (diabetic ketoacidoses) (HCC)    a. 05/2009  . Essential hypertension   . History of Bell's palsy   . Hyperlipidemia   . Hypertension   . Hypokalemia   . IDDM (insulin dependent diabetes mellitus) (HCC)   . Long-term insulin use (HCC)   . Morbid obesity (HCC)   . Peripheral vascular disease [facility])       Past Surgical History:  Procedure Laterality Date  . CATARACT EXTRACTION W/PHACO Left 02/08/2015   Procedure: CATARACT EXTRACTION PHACO AND INTRAOCULAR LENS PLACEMENT (IOC);  Surgeon: Galen Manila, MD;  Location: [facility] ORS;  Service: Ophthalmology;  Laterality: Left;  [place] 01:33 AP% 15.9 CDE 14.94 flyuid pack lot # [id number]  . CESAREAN SECTION     x2  . EYE SURGERY Right    Cataract Extraction with IOL  . FOREIGN BODY REMOVAL Left 05/13/2016   Procedure: REMOVAL FOREIGN BODY EXTREMITY;  Surgeon: Recardo Evangelist, DPM;  Location: [facility] ORS;  Service: Podiatry;  Laterality: Left;  . INCISION AND DRAINAGE Left 05/13/2016   Procedure: INCISION AND DRAINAGE;  Surgeon: Recardo Evangelist, DPM;  Location: [facility] ORS;  Service: Podiatry;  Laterality: Left;    in for   Chief Complaint  Patient presents with  . Wound Infection     HPI  Laura Kline  is a 61 y.o. female, 1 day status post I&D abscess left foot with open packing of dorsal wound    Anti-infectives    Start      Dose/Rate Route Frequency Ordered Stop   05/13/16 2300  vancomycin (VANCOCIN) 1,250 mg in sodium chloride 0.9 % 250 mL IVPB     1,250 mg 166.7 mL/hr over 90 Minutes Intravenous Every 12 hours 05/13/16 1218     05/12/16 2200  ceFEPIme (MAXIPIME) 2 g in dextrose 5 % 50 mL IVPB     2 g 100 mL/hr over 30 Minutes Intravenous Every 12 hours 05/12/16 1449     05/12/16 1800  vancomycin (VANCOCIN) 1,250 mg in sodium chloride 0.9 % 250 mL IVPB  Status:  Discontinued     1,250 mg 166.7 mL/hr over 90 Minutes Intravenous Every 18 hours 05/12/16 1449 05/13/16 1218   05/12/16 1200  aztreonam (AZACTAM) 2 g in dextrose 5 % 50 mL IVPB     2 g 100 mL/hr over 30 Minutes Intravenous  Once 05/12/16 1151 05/12/16 1434   05/12/16 1200  metroNIDAZOLE (FLAGYL) IVPB 500 mg     500 mg 100 mL/hr over 60 Minutes Intravenous  Once 05/12/16 1151 05/12/16 1343   05/12/16 1200  vancomycin (VANCOCIN) IVPB 1000 mg/200 mL premix     1,000 mg 200 mL/hr over 60 Minutes Intravenous  Once  05/12/16 1151 05/12/16 1316      Allergies  Allergen Reactions  . Rofecoxib Other (See Comments)  . Penicillins Rash    Physical Exam:  Patient is alert and oriented this point.Overall her color looks better and she seems to not have too much pain with her foot.  Vitals  Blood pressure 117/83, pulse 92, temperature 98.3 F (36.8 C), temperature source Oral, resp. rate 18, height 5\' 3"  (1.6 m), weight (!) 143.5 kg (316 lb 4.8 oz), SpO2 96 %.  Lower Extremity exam:wound packing removed today still of heavy drainage from the area but overall is much cleaner andoverall redness and swelling has reduced to a large extent.She is afebrile nd white count is coming down from 22 down to 11.1  Data Review  CBC  Recent Labs Lab 05/12/16 1130 05/12/16 1542 05/13/16 0431 05/14/16 0009  WBC 22.1* 17.5* 13.1* 11.6*  HGB 11.6* 9.9* 9.3* 9.3*  HCT 34.0* 29.4* 28.1* 27.7*  PLT 284 229 203 212  MCV 95.1 94.5 94.6 96.8  MCH 32.5 31.7 31.4  32.7  MCHC 34.1 33.5 33.2 33.8  RDW 13.1 13.1 13.2 13.2  LYMPHSABS 1.1  --   --  1.5  MONOABS 1.9*  --   --  1.1*  EOSABS 0.1  --   --  0.2  BASOSABS 0.1  --   --  0.0   ------------------------------------------------------------------------------------------------------------------  Chemistries   Recent Labs Lab 05/12/16 1130 05/12/16 1542 05/13/16 0431 05/14/16 0009  NA 132*  --  135 137  K 3.6  --  3.3* 3.5  CL 96*  --  108 109  CO2 22  --  22 21*  GLUCOSE 255*  --  233* 216*  BUN 22*  --  24* 24*  CREATININE 1.39* 1.21* 1.11* 1.09*  CALCIUM 8.3*  --  7.0* 7.0*  MG  --   --   --  1.5*  AST 17  --   --   --   ALT 11*  --   --   --   ALKPHOS 56  --   --   --   BILITOT 1.2  --   --   --    --------------------------------------------------------------------------------------------------------------Imaging results:   Mr Foot Left W Wo Contrast  Result Date: 05/13/2016 CLINICAL DATA:  Increasing left foot pain and erythema over the last week. Cellulitis with foreign body and suspected sepsis. EXAM: MRI OF THE LEFT FOREFOOT WITHOUT AND WITH CONTRAST TECHNIQUE: Multiplanar, multisequence MR imaging of the left forefoot was performed both before and after administration of intravenous contrast. CONTRAST:  20mL MULTIHANCE GADOBENATE DIMEGLUMINE 529 MG/ML IV SOLN COMPARISON:  Radiographs 05/12/2016. FINDINGS: Bones/Joint/Cartilage The metallic foreign body within the first web states creates significant magnetic susceptibility artifact, limiting evaluation of the first and second digits. Allowing for this artifact, there is no abnormal marrow signal or enhancement. There is no evidence of cortical destruction, acute fracture or dislocation. No significant joint effusions or abnormal synovial enhancement demonstrated. Ligaments The Lisfranc ligament is intact. 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 There is mild T2  hyperintensity along the flexor tendons. No focal intramuscular fluid collections or suspicious enhancement seen. Soft tissues There is dorsal subcutaneous edema. There is an ill-defined fluid collection dorsal to the second and third metatarsal heads, measuring up to 2.3 cm in greatest dimension. This demonstrates no peripheral enhancement or overlying skin defect. No other focal fluid collections are seen. IMPRESSION: 1. Findings  are consistent with dorsal forefoot cellulitis. There is a nonspecific small fluid collection within the dorsal subcutaneous fat without apparent overlying skin defect. 2. No evidence of deep abscess, osteomyelitis or septic joint. 3. Metallic foreign body within the first web space, creating moderate susceptibility artifact. Electronically Signed   By: Carey Bullocks M.D.   On: 05/13/2016 09:14     Assessment & Plan: Change packing today and repack with wet-to-dry saline gauze. Cellulitis is much improved and patient's white count is improvingas well. We need continuedaily packing and dressing changes. Awaiting culture reports every her on oral antibiotics .  Active Problems:   Sepsis [facility])  Family Communication: Plan discussed with patient   Epimenio Sarin M.D on 05/14/2016 at 12:46 PM

## 2016-05-14 NOTE — Progress Notes (Signed)
*  PRELIMINARY RESULTS* Echocardiogram 2D Echocardiogram has been performed.  Cristela BlueHege, Kye Silverstein 05/14/2016, 9:59 AM

## 2016-05-15 ENCOUNTER — Inpatient Hospital Stay: Payer: BLUE CROSS/BLUE SHIELD

## 2016-05-15 DIAGNOSIS — I272 Pulmonary hypertension, unspecified: Secondary | ICD-10-CM

## 2016-05-15 DIAGNOSIS — R0602 Shortness of breath: Secondary | ICD-10-CM

## 2016-05-15 DIAGNOSIS — E876 Hypokalemia: Secondary | ICD-10-CM

## 2016-05-15 LAB — BASIC METABOLIC PANEL
ANION GAP: 7 (ref 5–15)
BUN: 21 mg/dL — ABNORMAL HIGH (ref 6–20)
CALCIUM: 7.3 mg/dL — AB (ref 8.9–10.3)
CHLORIDE: 109 mmol/L (ref 101–111)
CO2: 23 mmol/L (ref 22–32)
CREATININE: 0.9 mg/dL (ref 0.44–1.00)
GFR calc Af Amer: >60 mL/min (ref >60)
GFR calc non Af Amer: >60 mL/min (ref >60)
Glucose, Bld: 156 mg/dL — ABNORMAL HIGH (ref 65–99)
Potassium: 4.1 mmol/L (ref 3.5–5.1)
Sodium: 139 mmol/L (ref 135–145)

## 2016-05-15 LAB — URINE CULTURE: Culture: NO GROWTH

## 2016-05-15 LAB — GLUCOSE, CAPILLARY
GLUCOSE-CAPILLARY: 128 mg/dL — AB (ref 65–99)
GLUCOSE-CAPILLARY: 147 mg/dL — AB (ref 65–99)
Glucose-Capillary: 138 mg/dL — ABNORMAL HIGH (ref 65–99)

## 2016-05-15 LAB — VANCOMYCIN, TROUGH: Vancomycin Tr: 35 ug/mL (ref 15–20)

## 2016-05-15 MED ORDER — FUROSEMIDE 40 MG PO TABS
40.0000 mg | ORAL_TABLET | Freq: Every day | ORAL | Status: DC
  Administered 2016-05-15 – 2016-05-18 (×4): 40 mg via ORAL
  Filled 2016-05-15 (×4): qty 1

## 2016-05-15 MED ORDER — SODIUM CHLORIDE 0.9% FLUSH: 10.0000 mL | INTRAVENOUS | Status: DC | PRN

## 2016-05-15 MED ORDER — VANCOMYCIN HCL 10 G IV SOLR
1250.0000 mg | INTRAVENOUS | Status: DC
  Administered 2016-05-15: 1250 mg via INTRAVENOUS
  Filled 2016-05-15 (×2): qty 1250

## 2016-05-15 MED ORDER — MAGNESIUM SULFATE 2 GM/50ML IV SOLN
2.0000 g | Freq: Once | INTRAVENOUS | Status: AC
  Administered 2016-05-15: 2 g via INTRAVENOUS
  Filled 2016-05-15: qty 50

## 2016-05-15 MED ORDER — SODIUM CHLORIDE 0.9% FLUSH
10.0000 mL | Freq: Two times a day (BID) | INTRAVENOUS | Status: DC
  Administered 2016-05-15 – 2016-05-18 (×7): 10 mL

## 2016-05-15 MED ORDER — HYDROCHLOROTHIAZIDE 25 MG PO TABS
25.0000 mg | ORAL_TABLET | Freq: Every day | ORAL | Status: DC
  Administered 2016-05-15 – 2016-05-17 (×3): 25 mg via ORAL
  Filled 2016-05-15 (×3): qty 1

## 2016-05-15 NOTE — Plan of Care (Signed)
Problem: Pain Managment: Goal: General experience of comfort will improve Outcome: Progressing Pt complained of pain in left foot once throughout the night treated for pain with norco with relief. Will continue to monitor.  Problem: Activity: Goal: Risk for activity intolerance will decrease Outcome: Not Progressing Pt NWB on the left foot, bedrest currently using the bedpan.

## 2016-05-15 NOTE — Progress Notes (Signed)
Peripherally Inserted Central Catheter/Midline Placement  The IV Nurse has discussed with the patient and/or persons authorized to consent for the patient, the purpose of this procedure and the potential benefits and risks involved with this procedure.  The benefits include less needle sticks, lab draws from the catheter, and the patient may be discharged home with the catheter. Risks include, but not limited to, infection, bleeding, blood clot (thrombus formation), and puncture of an artery; nerve damage and irregular heartbeat and possibility to perform a PICC exchange if needed/ordered by physician.  Alternatives to this procedure were also discussed.  Bard Power PICC patient education guide, fact sheet on infection prevention and patient information card has been provided to patient /or left at bedside.    PICC/Midline Placement Documentation        Lisabeth DevoidGibbs, Jolena Kittle Jeanette 05/15/2016, 12:11 PM

## 2016-05-15 NOTE — Plan of Care (Signed)
Problem: Safety: Goal: Ability to remain free from injury will improve Outcome: Progressing Fall precautions in place, no falls this admission  Problem: Pain Managment: Goal: General experience of comfort will improve Outcome: Not Progressing Prn medications as needed  Problem: Physical Regulation: Goal: Will remain free from infection Outcome: Not Progressing Remains on IV antibiotics

## 2016-05-15 NOTE — Progress Notes (Signed)
[facility] - [place] at [facility]   PATIENT NAME: Laura Kline    MR#:  [id number]  DATE OF BIRTH:  [birth year]  SUBJECTIVE:  CHIEF COMPLAINT:  Patient resting comfortably   REVIEW OF SYSTEMS:  CONSTITUTIONAL: No fever, fatigue or weakness.  EYES: No blurred or double vision.  EARS, NOSE, AND THROAT: No tinnitus or ear pain.  RESPIRATORY: No cough, shortness of breath, wheezing or hemoptysis.  CARDIOVASCULAR: No chest pain, orthopnea, edema.  GASTROINTESTINAL: No nausea, vomiting, diarrhea or abdominal pain.  GENITOURINARY: No dysuria, hematuria.  ENDOCRINE: No polyuria, nocturia,  HEMATOLOGY: No anemia, easy bruising or bleeding SKIN: No rash or lesion. MUSCULOSKELETAL: No joint pain or arthritis.   NEUROLOGIC: No tingling, numbness, weakness.  PSYCHIATRY: No anxiety or depression.   DRUG ALLERGIES:   Allergies  Allergen Reactions  . Rofecoxib Other (See Comments)  . Penicillins Rash    VITALS:  Blood pressure (!) 142/74, pulse (!) 124, temperature 97.7 F (36.5 C), temperature source Oral, resp. rate 18, height 5\' 3"  (1.6 m), weight (!) 143.5 kg (316 lb 4.8 oz), SpO2 98 %.  PHYSICAL EXAMINATION:  GENERAL:  61 y.o.-year-old patient lying in the bed with no acute distress.  EYES: Pupils equal, round, reactive to light and accommodation. No scleral icterus. Extraocular muscles intact.  HEENT: Head atraumatic, normocephalic. Oropharynx and nasopharynx clear.  NECK:  Supple, no jugular venous distention. No thyroid enlargement, no tenderness.  LUNGS: Normal breath sounds bilaterally, no wheezing, rales,rhonchi or crepitation. No use of accessory muscles of respiration.  CARDIOVASCULAR: Irregularly irregular. No murmurs, rubs, or gallops.  ABDOMEN: Soft, nontender, nondistended. Bowel sounds present. No organomegaly or mass.  EXTREMITIES: Left foot status post surgery , including dressing No pedal edema, cyanosis, or clubbing.  NEUROLOGIC: Cranial  nerves II through XII are intact. Muscle strength 5/5 in all extremities. Sensation intact. Gait not checked.  PSYCHIATRIC: The patient is alert and oriented x 3.  SKIN: No obvious rash, lesion, or ulcer.    LABORATORY PANEL:   CBC  Recent Labs Lab 05/14/16 0009  WBC 11.6*  HGB 9.3*  HCT 27.7*  PLT 212   ------------------------------------------------------------------------------------------------------------------  Chemistries   Recent Labs Lab 05/12/16 1130  05/14/16 0009 05/15/16 1020  NA 132*  < > 137 139  K 3.6  < > 3.5 4.1  CL 96*  < > 109 109  CO2 22  < > 21* 23  GLUCOSE 255*  < > 216* 156*  BUN 22*  < > 24* 21*  CREATININE 1.39*  < > 1.09* 0.90  CALCIUM 8.3*  < > 7.0* 7.3*  MG  --   --  1.5*  --   AST 17  --   --   --   ALT 11*  --   --   --   ALKPHOS 56  --   --   --   BILITOT 1.2  --   --   --   < > = values in this interval not displayed. ------------------------------------------------------------------------------------------------------------------  Cardiac Enzymes  Recent Labs Lab 05/12/16 2329  TROPONINI <0.03   ------------------------------------------------------------------------------------------------------------------  RADIOLOGY:  Dg Chest Port 1 View  Result Date: 05/15/2016 CLINICAL DATA:  Status post PICC line placement on the right EXAM: PORTABLE CHEST 1 VIEW COMPARISON:  07/26/2014 FINDINGS: Cardiac shadow is stable. Lungs are hypoinflated but clear. A new right-sided PICC line is noted with catheter tip at the cavoatrial junction in satisfactory position. No bony abnormality is noted. IMPRESSION: Status post  PICC line placement in satisfactory position. Electronically Signed   By: Alcide CleverMark  Lukens M.D.   On: 05/15/2016 12:40    EKG:   Orders placed or performed during the hospital encounter of 05/12/16  . ED EKG 12-Lead  . ED EKG 12-Lead  . EKG 12-Lead  . EKG 12-Lead    ASSESSMENT AND PLAN:   61 year old female with past  medical history of coronary artery disease, diabetes, peripheral vascular disease, hypertension, hyperlipidemia who presents to the hospital due to left lower extremity redness swelling and pain and noted to have cellulitis.  1. Sepsis-patient meets criteria given patient's elevated lactic acid, leukocytosis and left lower extremity findings suggestive of cellulitis. -Continue with broad-spectrum IV antibiotics with vancomycin, cefepime. -blood cultures negative for 2 days, wound culturesWith abundant gram-positive cocci. status post I  AND D of the left foot by podiatry -Urine culture no growth  2. Left lower extremity cellulitis/possible osteomyelitis-this is the cause of patient's sepsis. - IV Vanco, Cefepime. Status post incision and drainage of the left foot follow-up with podiatry and cultures. MRI is negative for osteomyelitis -. Wound packing changed today and agree packed with wet-to-dry saline gauze. Follow up on cultures  3. Atrial fibrillation with rapid ventricular response-this is new onset for the patient. Suspect this is probably initiated due to the underlying sepsis. - weaned off Cardizem drip,  On metoprolol 50 mg by mouth every 12 hours  -None trending cardiac biomarkers. Follow-up with cardiology. Pts CHADS2-VASc=3 started Eliquis 5 mg twice a day   4. Acute kidney injury-secondary to sepsis -Improved. Discontinue IV fluids -Follow BUN/creatinine creatinine 1.11-1.09 - 0.9. We'll resume hydrochlorothiazide and hold his lisinopril  5. Hyperlipidemia-continue Crestor.  6. Leukocytosis-secondary to sepsis and cellulitis. Follow with IV antibiotic therapy.  7.Essential hypertension-we'll continue metoprolol. Patient is on hydrochlorothiazide/lisinopril combination at home Resume hydrochlorothiazide and hold lisinopril     All the records are reviewed and case discussed with Care Management/Social Workerr. Management plans discussed with the patient, family and  they are in agreement.  CODE STATUS: FC   TOTAL TIME TAKING CARE OF THIS PATIENT: 36  minutes.   POSSIBLE D/C IN 2-3  DAYS, DEPENDING ON CLINICAL CONDITION.  Note: This dictation was prepared with Dragon dictation along with smaller phrase technology. Any transcriptional errors that result from this process are unintentional.   Ramonita LabGouru, Mahiya Kercheval M.D on 05/15/2016 at 3:56 PM  Between 7am to 6pm - Pager - [phone] After 6pm go to [email or web address] - password [email or web address]  Fabio Neighbors[facility] [place] [facility]  Office  [phone]  CC: Primary care physician; Patient, No Pcp Per

## 2016-05-15 NOTE — Care Management (Signed)
In the presence of her husband, patient is very reluctant to accept any home health support for wound assessment and wound care support or physical therapy.  Says her son works third shift and that would wake him up.  Discussed services may be very beneficial.  She will consider.  At present, there is no indication that patient is going to require home IV antibiotics.  Asked patient if this could be managed in the home if needed.  She never answered one way or the other.  Says before this episode of illness, used a cane for support sometime when ambulated.  No issues accessing medical care or with transportation.

## 2016-05-15 NOTE — Progress Notes (Signed)
Patient Name: Laura Kline Date of Encounter: 05/15/2016  Primary Cardiologist: [facility] Problem List     Active Problems:   Sepsis [facility])     Subjective   Intermittent pain involving left foot. Also got a little winded moving this morning to use the bedpan. No chest pain or lightheadedness. Chronic BLE edema (L>R) is stable per patient. Diltiazem gtt d/c'ed yesterday.  Inpatient Medications    Scheduled Meds: . apixaban  5 mg Oral BID  . aspirin EC  81 mg Oral Daily  . insulin aspart  0-5 Units Subcutaneous QHS  . insulin aspart  0-9 Units Subcutaneous TID WC  . insulin glargine  58 Units Subcutaneous QHS  . metoprolol  50 mg Oral BID  . rosuvastatin  10 mg Oral QPC supper   Continuous Infusions: . sodium chloride 100 mL/hr at 05/15/16 0747  . ceFEPime (MAXIPIME) IV 2 g (05/15/16 0916)  . vancomycin Stopped (05/15/16 0046)   PRN Meds: acetaminophen **OR** acetaminophen, HYDROcodone-acetaminophen, ondansetron **OR** ondansetron (ZOFRAN) IV   Vital Signs    Vitals:   05/14/16 0340 05/14/16 1959 05/15/16 0318 05/15/16 0753  BP: 117/83 127/69 132/81 (!) 120/40  Pulse: 92 (!) 48 (!) 106 (!) 116  Resp: 18 18 16 17   Temp: 98.3 F (36.8 C) 97.9 F (36.6 C) 97.7 F (36.5 C) 98 F (36.7 C)  TempSrc: Oral Oral Oral Oral  SpO2: 96% 98% 100% 98%  Weight: (!) 316 lb 4.8 oz (143.5 kg)     Height:        Intake/Output Summary (Last 24 hours) at 05/15/16 0952 Last data filed at 05/15/16 0933  Gross per 24 hour  Intake          6031.65 ml  Output              500 ml  Net          5531.65 ml   Filed Weights   05/12/16 1130 05/12/16 1529 05/14/16 0340  Weight: (!) 305 lb (138.3 kg) (!) 306 lb (138.8 kg) (!) 316 lb 4.8 oz (143.5 kg)    Physical Exam    GEN: Obese woman, lying comfortably in bed HEENT: Grossly normal.  Neck: Supple, no JVD, carotid bruits, or masses. Cardiac: Irregularly irregular, no murmurs, rubs, or gallops. No clubbing, cyanosis,  edema.  LLE dressed and wrapped. 2+ pretibial edema bilaterally. Respiratory:  Respirations regular and unlabored, clear to auscultation bilaterally. GI: Obese, soft, nontender, nondistended, BS + x 4. MS: no deformity or atrophy. Skin: warm and dry, no rash. Neuro:  Strength and sensation are intact. Psych: AAOx3.  Normal affect.  Labs    CBC  Recent Labs  05/12/16 1130  05/13/16 0431 05/14/16 0009  WBC 22.1*  < > 13.1* 11.6*  NEUTROABS 18.9*  --   --  8.7*  HGB 11.6*  < > 9.3* 9.3*  HCT 34.0*  < > 28.1* 27.7*  MCV 95.1  < > 94.6 96.8  PLT 284  < > 203 212  < > = values in this interval not displayed. Basic Metabolic Panel  Recent Labs  05/13/16 0431 05/14/16 0009  NA 135 137  K 3.3* 3.5  CL 108 109  CO2 22 21*  GLUCOSE 233* 216*  BUN 24* 24*  CREATININE 1.11* 1.09*  CALCIUM 7.0* 7.0*  MG  --  1.5*   Liver Function Tests  Recent Labs  05/12/16 1130  AST 17  ALT 11*  ALKPHOS 56  BILITOT 1.2  PROT 7.7  ALBUMIN 3.2*   No results for input(s): LIPASE, AMYLASE in the last 72 hours. Cardiac Enzymes  Recent Labs  05/12/16 1542 05/12/16 1920 05/12/16 2329  TROPONINI <0.03 <0.03 <0.03   BNP Invalid input(s): POCBNP D-Dimer No results for input(s): DDIMER in the last 72 hours. Hemoglobin A1C No results for input(s): HGBA1C in the last 72 hours. Fasting Lipid Panel No results for input(s): CHOL, HDL, LDLCALC, TRIG, CHOLHDL, LDLDIRECT in the last 72 hours. Thyroid Function Tests  Recent Labs  05/14/16 0009  TSH 1.124    Telemetry    Afib, HR 90's - 140's bpm; trending up this morning - Personally Reviewed  ECG    n/a - Personally Reviewed  Radiology    No results found.  Cardiac Studies   TTE (05/14/16): Study Conclusions  - Left ventricle: The cavity size was normal. There was mild   concentric hypertrophy. Systolic function was normal. The   estimated ejection fraction was in the range of 55% to 60%. Wall   motion was normal;  there were no regional wall motion   abnormalities. - Aortic valve: There was mild stenosis. Mean gradient (S): 10 mm   Hg. Valve area (Vmax): 1.64 cm^2. - Left atrium: The atrium was mildly dilated. - Right atrium: The atrium was mildly dilated. - Pulmonary arteries: Systolic pressure was mildly increased. PA   peak pressure: 45 mm Hg (S).  Patient Profile     61 y.o. female with history of DM, coronary calcification (noted on CT 2016), obesity, HTN, hyperlipidemia who [facility] LLE cellulitis and was found to be in new onset Afib with RVR.  Assessment & Plan    1. New onset Afib with RVR: -Remains in Afib -Suboptimal rate control, particularly this morning -Preserved LVEF with mild pulmonary hypertension -Will increase metoprolol to 50 mg Q6 hours. If HR remains suboptimally controlled, could consider adding oral diltiazem. -Eliquis 5 mg bid -Consider outpatient sleep study -Recent nuclear stress test in 2016 negative -Likely in the setting of her LLE cellulitis -TSH normal - Consider repeating BMP and Mg today; replete to keep levels greater than 4.0 and 2.0, respectively.  2. Coronary atherosclerosis: -No chest pain -Recommend risk factor modification: Diabetes control, hypertension, hyperlipidemia, obesity -Eliquis in place of ASA -Crestor   3. HTN: -Continue Lopressor as above  4. Hypokalemia: -Replete to goal of 4.0 -Mg low yesterday; will administer 2 gm IV  5. Shortness of breath: Mild DOE reported by patient today. Peripheral edema noted, as well as elevated pulmonary artery pressure. Suspect element of diastolic heart failure and/or right heart failure. -Start furosemide 40 mg PO daily -Rate control for a-fib, as above.   Signed, Yvonne Kendallhristopher Syre Knerr, MD [facility] Pager: [phone] 05/15/2016, 9:52 AM

## 2016-05-15 NOTE — Progress Notes (Signed)
Pharmacy Antibiotic Note  Laura Kline is a 61 y.o. female admitted on 05/12/2016 with cellulitis and possible osteomyelitis. Pharmacy has been consulted for cefepime and vancomycin dosing. Pt received 1 dose metronidazole, aztreonam and vancomycin 1000 mg IV in ED.   Currently ordered Cefepime 2g IV q12h and Vancomcyin 1250mg  IV q12h.  5/8 10:30 Vanc trough resulted at 35 mcg/ml  Plan: Continue with Cefepime 2g IV q12h.  Will decrease Vancomycin to 1250mg  IV q24h  Height: 5\' 3"  (160 cm) Weight: (!) 316 lb 4.8 oz (143.5 kg) IBW/kg (Calculated) : 52.4  Temp (24hrs), Avg:97.8 F (36.6 C), Min:97.7 F (36.5 C), Max:98 F (36.7 C)   Recent Labs Lab 05/12/16 1130 05/12/16 1425 05/12/16 1542 05/13/16 0431 05/14/16 0009 05/15/16 1020  WBC 22.1*  --  17.5* 13.1* 11.6*  --   CREATININE 1.39*  --  1.21* 1.11* 1.09* 0.90  LATICACIDVEN 2.4* 1.1  --   --   --   --   VANCOTROUGH  --   --   --   --   --  35*    Estimated Creatinine Clearance: 90.9 mL/min (by C-G formula based on SCr of 0.9 mg/dL).    Allergies  Allergen Reactions  . Rofecoxib Other (See Comments)  . Penicillins Rash    Antimicrobials this admission: Metronidazole, aztreonam 5/5 >> once vancomycin 5/5 >>  Cefepime 5/5 >>  Dose adjustments this admission: 5/8 Vancomycin decreased to 1250mg  IV q24h  Microbiology results: 5/5 BCx: NG 5/5 UCx: NG  5/5 Wound: GPC    MRSA PCR:   Thank you for allowing pharmacy to be a part of this patient's care.  Clovia CuffLisa Kenith Trickel, PharmD, BCPS 05/15/2016 1:43 PM

## 2016-05-16 DIAGNOSIS — L03116 Cellulitis of left lower limb: Secondary | ICD-10-CM

## 2016-05-16 DIAGNOSIS — A419 Sepsis, unspecified organism: Secondary | ICD-10-CM

## 2016-05-16 LAB — BASIC METABOLIC PANEL
Anion gap: 6 (ref 5–15)
BUN: 23 mg/dL — AB (ref 6–20)
CHLORIDE: 107 mmol/L (ref 101–111)
CO2: 24 mmol/L (ref 22–32)
CREATININE: 0.98 mg/dL (ref 0.44–1.00)
Calcium: 7.6 mg/dL — ABNORMAL LOW (ref 8.9–10.3)
GFR calc Af Amer: >60 mL/min (ref >60)
GFR calc non Af Amer: >60 mL/min (ref >60)
Glucose, Bld: 227 mg/dL — ABNORMAL HIGH (ref 65–99)
Potassium: 4.3 mmol/L (ref 3.5–5.1)
SODIUM: 137 mmol/L (ref 135–145)

## 2016-05-16 LAB — GLUCOSE, CAPILLARY
GLUCOSE-CAPILLARY: 140 mg/dL — AB (ref 65–99)
GLUCOSE-CAPILLARY: 165 mg/dL — AB (ref 65–99)
Glucose-Capillary: 149 mg/dL — ABNORMAL HIGH (ref 65–99)
Glucose-Capillary: 147 mg/dL — ABNORMAL HIGH (ref 65–99)
Glucose-Capillary: 165 mg/dL — ABNORMAL HIGH (ref 65–99)

## 2016-05-16 LAB — CBC WITH DIFFERENTIAL/PLATELET
Basophils Absolute: 0.1 10*3/uL (ref 0–0.1)
Basophils Relative: 1 %
EOS ABS: 0.2 10*3/uL (ref 0–0.7)
EOS PCT: 2 %
HCT: 27.5 % — ABNORMAL LOW (ref 35.0–47.0)
HEMOGLOBIN: 9.3 g/dL — AB (ref 12.0–16.0)
LYMPHS ABS: 1.5 10*3/uL (ref 1.0–3.6)
LYMPHS PCT: 13 %
MCH: 32.1 pg (ref 26.0–34.0)
MCHC: 33.7 g/dL (ref 32.0–36.0)
MCV: 95.2 fL (ref 80.0–100.0)
MONOS PCT: 9 %
Monocytes Absolute: 1 10*3/uL — ABNORMAL HIGH (ref 0.2–0.9)
Neutro Abs: 8.4 10*3/uL — ABNORMAL HIGH (ref 1.4–6.5)
Neutrophils Relative %: 75 %
PLATELETS: 264 10*3/uL (ref 150–440)
RBC: 2.89 MIL/uL — ABNORMAL LOW (ref 3.80–5.20)
RDW: 13.1 % (ref 11.5–14.5)
WBC: 11.2 10*3/uL — ABNORMAL HIGH (ref 3.6–11.0)

## 2016-05-16 LAB — MAGNESIUM: MAGNESIUM: 1.8 mg/dL (ref 1.7–2.4)

## 2016-05-16 MED ORDER — SULFAMETHOXAZOLE-TRIMETHOPRIM 800-160 MG PO TABS
1.0000 | ORAL_TABLET | Freq: Two times a day (BID) | ORAL | Status: DC
  Administered 2016-05-16 – 2016-05-18 (×4): 1 via ORAL
  Filled 2016-05-16 (×4): qty 1

## 2016-05-16 MED ORDER — DILTIAZEM HCL 30 MG PO TABS
30.0000 mg | ORAL_TABLET | Freq: Four times a day (QID) | ORAL | Status: DC
  Administered 2016-05-16 – 2016-05-17 (×5): 30 mg via ORAL
  Filled 2016-05-16 (×5): qty 1

## 2016-05-16 NOTE — Progress Notes (Signed)
Patient Name: Laura Kline Date of Encounter: 05/16/2016  [facility]  Hospital Problem List     Active Problems:   Sepsis [facility](HCC)     Subjective   SOB with minimal movement in her bed. Remains in Afib with RVR with heart rates in the 130s to 150s bpm. No chest pain or palpitations.   Inpatient Medications    Scheduled Meds: . apixaban  5 mg Oral BID  . furosemide  40 mg Oral Daily  . hydrochlorothiazide  25 mg Oral Daily  . insulin aspart  0-5 Units Subcutaneous QHS  . insulin aspart  0-9 Units Subcutaneous TID WC  . insulin glargine  58 Units Subcutaneous QHS  . metoprolol  50 mg Oral BID  . rosuvastatin  10 mg Oral QPC supper  . sodium chloride flush  10-40 mL Intracatheter Q12H   Continuous Infusions: . ceFEPime (MAXIPIME) IV Stopped (05/15/16 2158)  . vancomycin Stopped (05/15/16 2335)   PRN Meds: acetaminophen **OR** acetaminophen, HYDROcodone-acetaminophen, ondansetron **OR** ondansetron (ZOFRAN) IV, sodium chloride flush   Vital Signs    Vitals:   05/15/16 0753 05/15/16 1222 05/15/16 1939 05/16/16 0616  BP: (!) 120/40 (!) 142/74 (!) 135/52 139/80  Pulse: (!) 116 (!) 124 (!) 113 (!) 57  Resp: 17 18 20 20   Temp: 98 F (36.7 C) 97.7 F (36.5 C) 98 F (36.7 C) 98.7 F (37.1 C)  TempSrc: Oral Oral Oral Oral  SpO2: 98% 98% 99% 97%  Weight:      Height:        Intake/Output Summary (Last 24 hours) at 05/16/16 0821 Last data filed at 05/16/16 0030  Gross per 24 hour  Intake          1591.67 ml  Output             2050 ml  Net          -458.33 ml   Filed Weights   05/12/16 1130 05/12/16 1529 05/14/16 0340  Weight: (!) 305 lb (138.3 kg) (!) 306 lb (138.8 kg) (!) 316 lb 4.8 oz (143.5 kg)    Physical Exam    GEN: Well nourished, well developed, in no acute distress.  HEENT: Grossly normal.  Neck: Supple, no JVD, carotid bruits, or masses. Cardiac: Tachycardic, irregularly irregular, no murmurs, rubs, or gallops. No clubbing,  cyanosis. LLE dressed and wrapped. 2+ pretibial edema bilaterally. Radials/DP/PT 2+ and equal bilaterally.  Respiratory:  Respirations regular and unlabored, clear to auscultation bilaterally. GI: Obese, soft, nontender, nondistended, BS + x 4. MS: no deformity or atrophy. Skin: warm and dry, no rash. Neuro:  Strength and sensation are intact. Psych: AAOx3.  Normal affect.  Labs    CBC  Recent Labs  05/14/16 0009 05/16/16 0010  WBC 11.6* 11.2*  NEUTROABS 8.7* 8.4*  HGB 9.3* 9.3*  HCT 27.7* 27.5*  MCV 96.8 95.2  PLT 212 264   Basic Metabolic Panel  Recent Labs  05/14/16 0009 05/15/16 1020 05/16/16 0010  NA 137 139 137  K 3.5 4.1 4.3  CL 109 109 107  CO2 21* 23 24  GLUCOSE 216* 156* 227*  BUN 24* 21* 23*  CREATININE 1.09* 0.90 0.98  CALCIUM 7.0* 7.3* 7.6*  MG 1.5*  --  1.8   Liver Function Tests No results for input(s): AST, ALT, ALKPHOS, BILITOT, PROT, ALBUMIN in the last 72 hours. No results for input(s): LIPASE, AMYLASE in the last 72 hours. Cardiac Enzymes No results for input(s): CKTOTAL, CKMB, CKMBINDEX,  TROPONINI in the last 72 hours. BNP Invalid input(s): POCBNP D-Dimer No results for input(s): DDIMER in the last 72 hours. Hemoglobin A1C No results for input(s): HGBA1C in the last 72 hours. Fasting Lipid Panel No results for input(s): CHOL, HDL, LDLCALC, TRIG, CHOLHDL, LDLDIRECT in the last 72 hours. Thyroid Function Tests  Recent Labs  05/14/16 0009  TSH 1.124    Telemetry    Afib with RVR with heart rates in the 130s to 160s bpm - Personally Reviewed  ECG    n/a - Personally Reviewed  Radiology    Dg Chest Port 1 View  Result Date: 05/15/2016 CLINICAL DATA:  Status post PICC line placement on the right EXAM: PORTABLE CHEST 1 VIEW COMPARISON:  07/26/2014 FINDINGS: Cardiac shadow is stable. Lungs are hypoinflated but clear. A new right-sided PICC line is noted with catheter tip at the cavoatrial junction in satisfactory position. No bony  abnormality is noted. IMPRESSION: Status post PICC line placement in satisfactory position. Electronically Signed   By: Alcide Clever M.D.   On: 05/15/2016 12:40    Cardiac Studies   TTE (05/14/16): Study Conclusions  - Left ventricle: The cavity size was normal. There was mild concentric hypertrophy. Systolic function was normal. The estimated ejection fraction was in the range of 55% to 60%. Wall motion was normal; there were no regional wall motion abnormalities. - Aortic valve: There was mild stenosis. Mean gradient (S): 10 mm Hg. Valve area (Vmax): 1.64 cm^2. - Left atrium: The atrium was mildly dilated. - Right atrium: The atrium was mildly dilated. - Pulmonary arteries: Systolic pressure was mildly increased. PA peak pressure: 45 mm Hg (S).  Patient Profile     60 y.o. female with history of DM, coronary calcification (noted on CT 2016), obesity, HTN, hyperlipidemia who presented to [facility] with LLE cellulitis and was found to be in new onset Afib with RVR.  Assessment & Plan    1. New onset Afib with RVR: -Remains in Afib -Suboptimal rate control persists -Preserved LVEF with mild pulmonary hypertension -Add short-acting diltiazem 30 mg q 6 hours -Continue metoprolol to 50 mg bid -If HR remains suboptimally controlled, may need TEE/DCCV prior to discharge -Could also consider digoxin  -Eliquis 5 mg bid -Consider outpatient sleep study -Recent nuclear stress test in 2016 negative -Likely in the setting of her LLE cellulitis -TSH normal  2. Coronary atherosclerosis: -No chest pain -Recommend risk factor modification: Diabetes control, hypertension,hyperlipidemia, obesity -Eliquis in place of ASA -Crestor   3. HTN: -Continue Lopressor as above -Add short-acting diltiazem 30 mg q 6 hours as above for added rate control  4. Hypokalemia: -Repleted to goal of 4.0 -Mg improved  5. Shortness of breath: -Mild DOE persists. Peripheral edema noted, as  well as elevated pulmonary artery pressure -Suspect element of diastolic heart failure and/or right heart failure -Continue furosemide 40 mg PO daily -Rate control for a-fib, as above  Signed, Eula Listen, PA-C [facility] Pager: [phone] 05/16/2016, 8:21 AM   Attending Note Patient seen and examined, agree with detailed note above,  Patient presentation and plan discussed on rounds.   Patient resting in the room comfortably on exam today Heart rate 90-100 at rest,  Nurses report rate up to 160 on exertion (prior to start of diltiazem) Rate improved after first diltiazem dose She reports being asymptomatic  Cellulitis in the left lower extremity, recent bandage placed, on broad-spectrum antibiotics  On physical exam lungs are clear, no JVP, heart sounds irregularly irregular,  abdomen obese nontender, nonpitting lower extremity edema  --Would continue current medications, agree with addition of diltiazem 30 mg every 6 Would decrease dose for hypotension Would agree that digoxin can be used for tachycardia Continue anticoagulation with eliquis 5 mg twice a day for 1 month then consider cardioversion  High risk of atrial fibrillation given obesity We'll need to rule out sleep apnea   Greater than 50% was spent in counseling and coordination of care with patient Total encounter time 25 minutes or more   Signed: Dossie Arbour  M.D., Ph.D. [facility]

## 2016-05-16 NOTE — Plan of Care (Signed)
Problem: Pain Managment: Goal: General experience of comfort will improve Outcome: Progressing Pt with no complaints of pain this shift, will continue to monitor.  Problem: Tissue Perfusion: Goal: Risk factors for ineffective tissue perfusion will decrease Outcome: Completed/Met Date Met: 05/16/16 Pt on PO eliquis and lovenox subcutaneously for VTE  Problem: Activity: Goal: Risk for activity intolerance will decrease Outcome: Progressing Pt continues to use bedpan throughout shift, PT to see her today

## 2016-05-16 NOTE — Progress Notes (Signed)
Patient Demographics  Laura Kline, is a 61 y.o. female   MRN: [id number]   DOB - 09/18/1955  Admit Date - 05/12/2016    Outpatient Primary MD for the patient is Patient, No Pcp Per  Consult requested in the Hospital by Ramonita LabGouru, Aruna, MD, On 05/16/2016     With History of -  Past Medical History:  Diagnosis Date  . Coronary atherosclerosis    LAD calcifications noted on CT scan in 2016 with negative nuclear stress test.  . DKA (diabetic ketoacidoses) (HCC)    a. 05/2009  . Essential hypertension   . History of Bell's palsy   . Hyperlipidemia   . Hypertension   . Hypokalemia   . IDDM (insulin dependent diabetes mellitus) (HCC)   . Long-term insulin use (HCC)   . Morbid obesity (HCC)   . Peripheral vascular disease [facility](HCC)       Past Surgical History:  Procedure Laterality Date  . CATARACT EXTRACTION W/PHACO Left 02/08/2015   Procedure: CATARACT EXTRACTION PHACO AND INTRAOCULAR LENS PLACEMENT (IOC);  Surgeon: Galen ManilaWilliam Porfilio, MD;  Location: [facility] ORS;  Service: Ophthalmology;  Laterality: Left;  US 01:33 AP% 15.9 CDE 14.94 flyuid pack lot # [id number]  . CESAREAN SECTION     x2  . EYE SURGERY Right    Cataract Extraction with IOL  . FOREIGN BODY REMOVAL Left 05/13/2016   Procedure: REMOVAL FOREIGN BODY EXTREMITY;  Surgeon: Recardo Evangelistroxler, Hailyn Zarr, DPM;  Location: [facility] ORS;  Service: Podiatry;  Laterality: Left;  . INCISION AND DRAINAGE Left 05/13/2016   Procedure: INCISION AND DRAINAGE;  Surgeon: Recardo Evangelistroxler, Alif Petrak, DPM;  Location: [facility] ORS;  Service: Podiatry;  Laterality: Left;    in for   Chief Complaint  Patient presents with  . Wound Infection     HPI  Laura Kline  is a 61 y.o. female, And hospitalized for cellulitis abscess left foot. I did an incision and drainage on this on 56 and she's had wound  packing and dressing is no obvious antibiotics since that time frame. She is improving but had a recent episode of atrial fibrillation last night and is under cardiology evaluation of present.    Social History Social History  Substance Use Topics  . Smoking status: Never Smoker  . Smokeless tobacco: Never Used  . Alcohol use No     Family History Family History  Problem Relation Age of Onset  . Hypertension Mother   . Hypertension Father   . CAD Father     s/p cabg  . Heart attack Father   . Heart failure Father                                                                Anti-infectives    Start     Dose/Rate Route Frequency Ordered Stop   05/15/16 2300  vancomycin (VANCOCIN) 1,250 mg in sodium chloride 0.9 % 250 mL IVPB     1,250 mg 166.7 mL/hr over 90 Minutes  Intravenous Every 24 hours 05/15/16 1137     05/13/16 2300  vancomycin (VANCOCIN) 1,250 mg in sodium chloride 0.9 % 250 mL IVPB  Status:  Discontinued     1,250 mg 166.7 mL/hr over 90 Minutes Intravenous Every 12 hours 05/13/16 1218 05/15/16 1137   05/12/16 2200  ceFEPIme (MAXIPIME) 2 g in dextrose 5 % 50 mL IVPB     2 g 100 mL/hr over 30 Minutes Intravenous Every 12 hours 05/12/16 1449     05/12/16 1800  vancomycin (VANCOCIN) 1,250 mg in sodium chloride 0.9 % 250 mL IVPB  Status:  Discontinued     1,250 mg 166.7 mL/hr over 90 Minutes Intravenous Every 18 hours 05/12/16 1449 05/13/16 1218   05/12/16 1200  aztreonam (AZACTAM) 2 g in dextrose 5 % 50 mL IVPB     2 g 100 mL/hr over 30 Minutes Intravenous  Once 05/12/16 1151 05/12/16 1434   05/12/16 1200  metroNIDAZOLE (FLAGYL) IVPB 500 mg     500 mg 100 mL/hr over 60 Minutes Intravenous  Once 05/12/16 1151 05/12/16 1343   05/12/16 1200  vancomycin (VANCOCIN) IVPB 1000 mg/200 mL premix     1,000 mg 200 mL/hr over 60 Minutes Intravenous  Once 05/12/16 1151 05/12/16 1316      Allergies  Allergen Reactions  . Rofecoxib Other (See Comments)  .  Penicillins Rash    Physical Exam  Vitals  Blood pressure 135/77, pulse (!) 104, temperature 98.1 F (36.7 C), temperature source Oral, resp. rate 20, height 5\' 3"  (1.6 m), weight (!) 143.5 kg (316 lb 4.8 oz), SpO2 94 %.  Lower Extremity exam: White count is down around 11.2 at this point this is down from 22.1 the plantar admission. Examination of the foot shows are still some redness and swelling swelling is present in both lower extremities. Redness is improved significantly from Sunday time did the incision and drainage. Wound itself is bleeding fairly well to this point it looks fairly clean. Wound cultures from Sunday show a staph aureus is not sure of susceptibilities as of yet. Data Review  CBC  Recent Labs Lab 05/12/16 1130 05/12/16 1542 05/13/16 0431 05/14/16 0009 05/16/16 0010  WBC 22.1* 17.5* 13.1* 11.6* 11.2*  HGB 11.6* 9.9* 9.3* 9.3* 9.3*  HCT 34.0* 29.4* 28.1* 27.7* 27.5*  PLT 284 229 203 212 264  MCV 95.1 94.5 94.6 96.8 95.2  MCH 32.5 31.7 31.4 32.7 32.1  MCHC 34.1 33.5 33.2 33.8 33.7  RDW 13.1 13.1 13.2 13.2 13.1  LYMPHSABS 1.1  --   --  1.5 1.5  MONOABS 1.9*  --   --  1.1* 1.0*  EOSABS 0.1  --   --  0.2 0.2  BASOSABS 0.1  --   --  0.0 0.1   ------------------------------------------------------------------------------------------------------------------  Chemistries   Recent Labs Lab 05/12/16 1130 05/12/16 1542 05/13/16 0431 05/14/16 0009 05/15/16 1020 05/16/16 0010  NA 132*  --  135 137 139 137  K 3.6  --  3.3* 3.5 4.1 4.3  CL 96*  --  108 109 109 107  CO2 22  --  22 21* 23 24  GLUCOSE 255*  --  233* 216* 156* 227*  BUN 22*  --  24* 24* 21* 23*  CREATININE 1.39* 1.21* 1.11* 1.09* 0.90 0.98  CALCIUM 8.3*  --  7.0* 7.0* 7.3* 7.6*  MG  --   --   --  1.5*  --  1.8  AST 17  --   --   --   --   --  ALT 11*  --   --   --   --   --   ALKPHOS 56  --   --   --   --   --   BILITOT 1.2  --   --   --   --   --     ----------------------------------------------------------------------------------------------------------------Assessment & Plan: Foot continues to stabilize albeit still is somewhat infected with the redness but the drainage is improving and the wound is progressing. Plan: Change impact dressing today. Can send her home on oral antibiotics hopefully tomorrow if her atrial fibrillation is stable. Still physical therapy to make sure she didn't get up and go the bathroom using a walker appropriately. When she goes home she'll need daily wet-to-dry saline packing to the wound by home health care. Also will need to follow her up again next week.  Active Problems:   Sepsis [facility])  Family Communication: Plan discussed with patient    Epimenio Sarin M.D on 05/16/2016 at 12:38 PM  Thank you for the consult, we will follow the patient with you in the Hospital.

## 2016-05-16 NOTE — Evaluation (Signed)
Physical Therapy Evaluation Patient Details Name: Laura Kline MRN: [id number] DOB: [birth year] Today's Date: 05/16/2016   History of Present Illness  Pt is a 61 y.o. female admitted on 05/12/16 for sepsis secondary to a cellulitis of a wound on her L dorsal forefoot and a metalic foreign body in the first web space, s/p irrigation and debredment on 05/13/16. Pt also presented with new A-fib with RVR during admission and has a PMH of coronary atherosclerosis, DKA, essential HTN, hx of Bell's palsey, HLD, insulin dependent DM, and PVD.  Clinical Impression  Pt was resting in bed upon entry. Pt was easily aroused and agreeable to PT evaluation. Pt lives in a one story home with her husband and her son who will be available PRN upon d/c. Pt presents with generalized weakness and required mod assist for bed mobility. Eval limited d/t pt's HR elevated to 167 bpm after sitting EOB from 120s to 140s lying in bed prior to sitting (RN notified). Pt returned to lying in bed to reduce HR and pt's HR in the 120s at end of eval. Pt will benefit from skilled PT during admission to increase strength, activity tolerance, and educate pt on use of orthowedge shoe and use of RW during ambulation. Recommend SNF at d/c to address deficits mentioned above and d/t decreased caregiver support at home.    Follow Up Recommendations SNF    Equipment Recommendations  Rolling walker with 5" wheels;3in1 (PT) (pt needs short bariatric RW and bariatric 3in1)    Recommendations for Other Services       Precautions / Restrictions Precautions Precautions: Fall Restrictions Weight Bearing Restrictions: Yes Other Position/Activity Restrictions: heel weight bearing with orthowedge shoe donned with RW      Mobility  Bed Mobility Overal bed mobility: Needs Assistance Bed Mobility: Supine to Sit     Supine to sit: Mod assist;HOB elevated     General bed mobility comments: Pt first attempted to roll on her side to sit EOB, but  had more success long sitting then bringing her legs over the edge with mod assist for trunk and scooting EOB; heavy use of bed rails to lie back down and mod assist for B LE   Transfers Overall transfer level:  (Not attempted d/t elevated HR (see PT notes))                  Ambulation/Gait                Stairs            Wheelchair Mobility    Modified Rankin (Stroke Patients Only)       Balance Overall balance assessment: Needs assistance Sitting-balance support: Bilateral upper extremity supported;Feet unsupported Sitting balance-Leahy Scale: Fair Sitting balance - Comments: Pt provided CGA sitting EOB for 1-2 min                                     Pertinent Vitals/Pain Pain Assessment: No/denies pain    Home Living Family/patient expects to be discharged to:: Private residence Living Arrangements: Spouse/significant other;Children (Pt's son live with pt and her husband.) Available Help at Discharge: Family;Available PRN/intermittently Type of Home: House Home Access: Stairs to enter Entrance Stairs-Rails: None Entrance Stairs-Number of Steps: 1 Home Layout: One level Home Equipment: Cane - single point;Tub bench;Other (comment) (Pt reports new lift chair to be delivered to home yesterday or today, not confirmed yet)  Prior Function Level of Independence: Independent with assistive device(s)         Comments: Pt was independent with ADLs and IADLs prior to admission and used a SPC for ambulation at home and in the community. Pt's husband works PT and her son works third shift and they can only provided assistance PRN.     Hand Dominance        Extremity/Trunk Assessment   Upper Extremity Assessment Upper Extremity Assessment: Generalized weakness (grossly at least 3/5 B UE strength)    Lower Extremity Assessment Lower Extremity Assessment: Generalized weakness (grossly at least 3/5 B LE strength)    Cervical /  Trunk Assessment Cervical / Trunk Assessment: Normal  Communication   Communication: No difficulties  Cognition Arousal/Alertness: Awake/alert Behavior During Therapy: WFL for tasks assessed/performed Overall Cognitive Status: Within Functional Limits for tasks assessed                                        General Comments      Exercises     Assessment/Plan    PT Assessment Patient needs continued PT services  PT Problem List Decreased strength;Decreased mobility;Decreased knowledge of precautions;Decreased balance;Decreased knowledge of use of DME;Decreased activity tolerance;Pain       PT Treatment Interventions DME instruction;Therapeutic activities;Gait training;Therapeutic exercise;Patient/family education;Stair training;Balance training;Functional mobility training    PT Goals (Current goals can be found in the Care Plan section)  Acute Rehab PT Goals Patient Stated Goal: to go home PT Goal Formulation: With patient Time For Goal Achievement: 05/30/16 Potential to Achieve Goals: Good    Frequency Min 2X/week   Barriers to discharge Decreased caregiver support      Co-evaluation               AM-PAC PT "6 Clicks" Daily Activity  Outcome Measure Difficulty turning over in bed (including adjusting bedclothes, sheets and blankets)?: A Little Difficulty moving from lying on back to sitting on the side of the bed? : Total Difficulty sitting down on and standing up from a chair with arms (e.g., wheelchair, bedside commode, etc,.)?: Total Help needed moving to and from a bed to chair (including a wheelchair)?: A Lot Help needed walking in hospital room?: A Lot Help needed climbing 3-5 steps with a railing? : A Lot 6 Click Score: 11    End of Session   Activity Tolerance: Patient tolerated treatment well;Treatment limited secondary to medical complications (Comment) (elevated HR sitting EOB) Patient left: in bed;with call bell/phone within  reach;with nursing/sitter in room (nurse tech confirmed that bed alarm would be set after pt was off bed pan) Nurse Communication: Mobility status;Precautions (elevated HR (see PT comments)) PT Visit Diagnosis: Muscle weakness (generalized) (M62.81);Pain;Difficulty in walking, not elsewhere classified (R26.2) Pain - Right/Left: Left Pain - part of body: Ankle and joints of foot    Time: [id number] PT Time Calculation (min) (ACUTE ONLY): 25 min   Charges:         PT G Codes:        Mikenzi Raysor, SPT 05/16/2016, 1:46 PM

## 2016-05-16 NOTE — Progress Notes (Signed)
[facility] - [place] at [facility]   PATIENT NAME: Laura Kline    MR#:  [id number]  DATE OF BIRTH:  16-Jul-1955  SUBJECTIVE:  CHIEF COMPLAINT:  Patient resting comfortably , still in afib with RVR  REVIEW OF SYSTEMS:  CONSTITUTIONAL: No fever, fatigue or weakness.  EYES: No blurred or double vision.  EARS, NOSE, AND THROAT: No tinnitus or ear pain.  RESPIRATORY: No cough, shortness of breath, wheezing or hemoptysis.  CARDIOVASCULAR: No chest pain, orthopnea, edema.  GASTROINTESTINAL: No nausea, vomiting, diarrhea or abdominal pain.  GENITOURINARY: No dysuria, hematuria.  ENDOCRINE: No polyuria, nocturia,  HEMATOLOGY: No anemia, easy bruising or bleeding SKIN: No rash or lesion. MUSCULOSKELETAL: No joint pain or arthritis.   NEUROLOGIC: No tingling, numbness, weakness.  PSYCHIATRY: No anxiety or depression.   DRUG ALLERGIES:   Allergies  Allergen Reactions  . Rofecoxib Other (See Comments)  . Penicillins Rash    VITALS:  Blood pressure 135/77, pulse (!) 104, temperature 98.1 F (36.7 C), temperature source Oral, resp. rate 20, height 5\' 3"  (1.6 m), weight (!) 143.5 kg (316 lb 4.8 oz), SpO2 94 %.  PHYSICAL EXAMINATION:  GENERAL:  61 y.o.-year-old patient lying in the bed with no acute distress.  EYES: Pupils equal, round, reactive to light and accommodation. No scleral icterus. Extraocular muscles intact.  HEENT: Head atraumatic, normocephalic. Oropharynx and nasopharynx clear.  NECK:  Supple, no jugular venous distention. No thyroid enlargement, no tenderness.  LUNGS: Normal breath sounds bilaterally, no wheezing, rales,rhonchi or crepitation. No use of accessory muscles of respiration.  CARDIOVASCULAR: Irregularly irregular. No murmurs, rubs, or gallops.  ABDOMEN: Soft, nontender, nondistended. Bowel sounds present. No organomegaly or mass.  EXTREMITIES: Left foot status post surgery , including dressing No pedal edema, cyanosis, or clubbing.   NEUROLOGIC: Cranial nerves II through XII are intact. Muscle strength 5/5 in all extremities. Sensation intact. Gait not checked.  PSYCHIATRIC: The patient is alert and oriented x 3.  SKIN: No obvious rash, lesion, or ulcer.    LABORATORY PANEL:   CBC  Recent Labs Lab 05/16/16 0010  WBC 11.2*  HGB 9.3*  HCT 27.5*  PLT 264   ------------------------------------------------------------------------------------------------------------------  Chemistries   Recent Labs Lab 05/12/16 1130  05/16/16 0010  NA 132*  < > 137  K 3.6  < > 4.3  CL 96*  < > 107  CO2 22  < > 24  GLUCOSE 255*  < > 227*  BUN 22*  < > 23*  CREATININE 1.39*  < > 0.98  CALCIUM 8.3*  < > 7.6*  MG  --   < > 1.8  AST 17  --   --   ALT 11*  --   --   ALKPHOS 56  --   --   BILITOT 1.2  --   --   < > = values in this interval not displayed. ------------------------------------------------------------------------------------------------------------------  Cardiac Enzymes  Recent Labs Lab 05/12/16 2329  TROPONINI <0.03   ------------------------------------------------------------------------------------------------------------------  RADIOLOGY:  Dg Chest Port 1 View  Result Date: 05/15/2016 CLINICAL DATA:  Status post PICC line placement on the right EXAM: PORTABLE CHEST 1 VIEW COMPARISON:  07/26/2014 FINDINGS: Cardiac shadow is stable. Lungs are hypoinflated but clear. A new right-sided PICC line is noted with catheter tip at the cavoatrial junction in satisfactory position. No bony abnormality is noted. IMPRESSION: Status post PICC line placement in satisfactory position. Electronically Signed   By: Alcide Clever M.D.   On: 05/15/2016 12:40  EKG:   Orders placed or performed during the hospital encounter of 05/12/16  . ED EKG 12-Lead  . ED EKG 12-Lead  . EKG 12-Lead  . EKG 12-Lead    ASSESSMENT AND PLAN:   61 year old female with past medical history of coronary artery disease, diabetes,  peripheral vascular disease, hypertension, hyperlipidemia who presents to the hospital due to left lower extremity redness swelling and pain and noted to have cellulitis.  1. Sepsis-patient meets criteria given patient's elevated lactic acid, leukocytosis and left lower extremity findings suggestive of cellulitis. -Continue with broad-spectrum IV antibiotics with vancomycin, cefepime. -blood cultures negative , wound culturesWith abundant gram-positive cocci ,Staph aureus, sensitivity pending status post I  AND D of the left foot by podiatry, Dressing changed by podiatry today -Urine culture no growth  2. Left lower extremity cellulitis/possible osteomyelitis-this is the cause of patient's sepsis. - IV Vanco, Cefepime. Status post incision and drainage of the left foot follow-up with podiatry and cultures. MRI is negative for osteomyelitis -. Wound packing changed today and agree packed with wet-to-dry saline gauze. Follow up on cultures  3. Atrial fibrillation with rapid ventricular response-this is new onset for the patient. Suspect this is probably initiated due to the underlying sepsis. - weaned off Cardizem drip,  On metoprolol 50 mg by mouth every 12 hours . Cardizem 30 mg by mouth every 6 hours is ordered -None trending cardiac biomarkers. Follow-up with cardiology. Pts CHADS2-VASc=3 started Eliquis 5 mg twice a day   4. Acute kidney injury-secondary to sepsis -Improved. Discontinue IV fluids -Follow BUN/creatinine creatinine 1.11-1.09 - 0.9. We'll resume hydrochlorothiazide and hold his lisinopril  5. Hyperlipidemia-continue Crestor.  6. Leukocytosis-secondary to sepsis and cellulitis. Follow with IV antibiotic therapy.  7.Essential hypertension-we'll continue metoprolol. Patient is on hydrochlorothiazide/lisinopril combination at home Resume hydrochlorothiazide and hold lisinopril  Consult physical therapy   All the records are reviewed and case discussed with Care  Management/Social Workerr. Management plans discussed with the patient, family and they are in agreement.  CODE STATUS: FC   TOTAL TIME TAKING CARE OF THIS PATIENT: 36  minutes.   POSSIBLE D/C IN 2-3  DAYS, DEPENDING ON CLINICAL CONDITION.  Note: This dictation was prepared with Dragon dictation along with smaller phrase technology. Any transcriptional errors that result from this process are unintentional.   Ramonita LabGouru, Levon Penning M.D on 05/16/2016 at 1:50 PM  Between 7am to 6pm - Pager - [phone] After 6pm go to [email or web address] - password [email or web address]  [facility]  Office  [phone]  CC: Primary care physician; Patient, No Pcp Per

## 2016-05-17 DIAGNOSIS — I4819 Other persistent atrial fibrillation: Secondary | ICD-10-CM

## 2016-05-17 LAB — BASIC METABOLIC PANEL
ANION GAP: 7 (ref 5–15)
BUN: 19 mg/dL (ref 6–20)
CHLORIDE: 102 mmol/L (ref 101–111)
CO2: 26 mmol/L (ref 22–32)
Calcium: 8.5 mg/dL — ABNORMAL LOW (ref 8.9–10.3)
Creatinine, Ser: 0.98 mg/dL (ref 0.44–1.00)
GFR calc Af Amer: >60 mL/min (ref >60)
GFR calc non Af Amer: >60 mL/min (ref >60)
GLUCOSE: 232 mg/dL — AB (ref 65–99)
POTASSIUM: 3.9 mmol/L (ref 3.5–5.1)
Sodium: 135 mmol/L (ref 135–145)

## 2016-05-17 LAB — CULTURE, BLOOD (ROUTINE X 2)
CULTURE: NO GROWTH
Culture: NO GROWTH
Special Requests: ADEQUATE

## 2016-05-17 LAB — GLUCOSE, CAPILLARY
GLUCOSE-CAPILLARY: 192 mg/dL — AB (ref 65–99)
GLUCOSE-CAPILLARY: 164 mg/dL — AB (ref 65–99)
Glucose-Capillary: 121 mg/dL — ABNORMAL HIGH (ref 65–99)

## 2016-05-17 MED ORDER — DILTIAZEM HCL ER COATED BEADS 180 MG PO CP24
180.0000 mg | ORAL_CAPSULE | Freq: Every day | ORAL | Status: DC
  Administered 2016-05-17 – 2016-05-18 (×2): 180 mg via ORAL
  Filled 2016-05-17 (×2): qty 1

## 2016-05-17 MED ORDER — DIGOXIN 0.25 MG/ML IJ SOLN
0.5000 mg | Freq: Once | INTRAMUSCULAR | Status: AC
  Administered 2016-05-17: 0.5 mg via INTRAVENOUS
  Filled 2016-05-17: qty 2

## 2016-05-17 MED ORDER — DIGOXIN 125 MCG PO TABS: 0.1250 mg | ORAL_TABLET | Freq: Every day | ORAL | Status: DC

## 2016-05-17 NOTE — Plan of Care (Signed)
Problem: Pain Managment: Goal: General experience of comfort will improve Outcome: Progressing Pt treated with norco once this shift for intermittent left foot pain, norco did give relief. Will continue to monitor.  Problem: Activity: Goal: Risk for activity intolerance will decrease Outcome: Not Progressing Pt still using bedpan currently has not been out of bed due to heart rate jumping up with activity.

## 2016-05-17 NOTE — Progress Notes (Signed)
Patient Name: Laura Kline Date of Encounter: 05/17/2016  [facility]  Hospital Problem List     Active Problems:   Sepsis [facility](HCC)     Subjective   Patient states she is doing "terrible" this morning. When I ask her about this, she responds, "there was a machines out there in the hallway beeping all morning." I ask her is anything is bothering her or hurting her and she denies anything is hurting her. She currently has no complaints. Remains in Afib with RVR with heart rates in the 120s to 140s bpm at rest in her bed. No labs today. No chest pain. Cannot feel any palpitations.   Inpatient Medications    Scheduled Meds: . apixaban  5 mg Oral BID  . diltiazem  30 mg Oral Q6H  . furosemide  40 mg Oral Daily  . hydrochlorothiazide  25 mg Oral Daily  . insulin aspart  0-5 Units Subcutaneous QHS  . insulin aspart  0-9 Units Subcutaneous TID WC  . insulin glargine  58 Units Subcutaneous QHS  . metoprolol  50 mg Oral BID  . rosuvastatin  10 mg Oral QPC supper  . sodium chloride flush  10-40 mL Intracatheter Q12H  . sulfamethoxazole-trimethoprim  1 tablet Oral Q12H   Continuous Infusions:  PRN Meds: acetaminophen **OR** acetaminophen, HYDROcodone-acetaminophen, ondansetron **OR** ondansetron (ZOFRAN) IV, sodium chloride flush   Vital Signs    Vitals:   05/16/16 2033 05/17/16 0432 05/17/16 0436 05/17/16 0749  BP: 130/67 (!) 113/38 126/69 135/63  Pulse: 99 97 80 100  Resp: 18 18  16   Temp: 98.4 F (36.9 C) 98.7 F (37.1 C)  98.2 F (36.8 C)  TempSrc: Oral Oral  Oral  SpO2: 98% 98%  100%  Weight:      Height:        Intake/Output Summary (Last 24 hours) at 05/17/16 1122 Last data filed at 05/17/16 0952  Gross per 24 hour  Intake              510 ml  Output             1250 ml  Net             -740 ml   Filed Weights   05/12/16 1130 05/12/16 1529 05/14/16 0340  Weight: (!) 305 lb (138.3 kg) (!) 306 lb (138.8 kg) (!) 316 lb 4.8 oz (143.5 kg)     Physical Exam    GEN: Well nourished, well developed, in no acute distress.  HEENT: Grossly normal.  Neck: Supple, no JVD, carotid bruits, or masses. Cardiac: Tachycardic, irregularly irregular, no murmurs, rubs, or gallops. No clubbing, cyanosis, LLE dressed and wrapped. 2+ pretibial edema bilaterally.  Radials/DP/PT 2+ and equal bilaterally.  Respiratory:  Respirations regular and unlabored, clear to auscultation bilaterally. GI: Obese, soft, nontender, nondistended, BS + x 4. MS: no deformity or atrophy. Skin: warm and dry, no rash. Neuro:  Strength and sensation are intact. Psych: AAOx3.  Normal affect.  Labs    CBC  Recent Labs  05/16/16 0010  WBC 11.2*  NEUTROABS 8.4*  HGB 9.3*  HCT 27.5*  MCV 95.2  PLT 264   Basic Metabolic Panel  Recent Labs  05/15/16 1020 05/16/16 0010  NA 139 137  K 4.1 4.3  CL 109 107  CO2 23 24  GLUCOSE 156* 227*  BUN 21* 23*  CREATININE 0.90 0.98  CALCIUM 7.3* 7.6*  MG  --  1.8   Liver Function  Tests No results for input(s): AST, ALT, ALKPHOS, BILITOT, PROT, ALBUMIN in the last 72 hours. No results for input(s): LIPASE, AMYLASE in the last 72 hours. Cardiac Enzymes No results for input(s): CKTOTAL, CKMB, CKMBINDEX, TROPONINI in the last 72 hours. BNP Invalid input(s): POCBNP D-Dimer No results for input(s): DDIMER in the last 72 hours. Hemoglobin A1C No results for input(s): HGBA1C in the last 72 hours. Fasting Lipid Panel No results for input(s): CHOL, HDL, LDLCALC, TRIG, CHOLHDL, LDLDIRECT in the last 72 hours. Thyroid Function Tests No results for input(s): TSH, T4TOTAL, T3FREE, THYROIDAB in the last 72 hours.  Invalid input(s): FREET3  Telemetry    Afib with RVR, heart rates 120s to 140s bpm - Personally Reviewed  ECG    n/a - Personally Reviewed  Radiology    Dg Chest Port 1 View  Result Date: 05/15/2016 CLINICAL DATA:  Status post PICC line placement on the right EXAM: PORTABLE CHEST 1 VIEW COMPARISON:   07/26/2014 FINDINGS: Cardiac shadow is stable. Lungs are hypoinflated but clear. A new right-sided PICC line is noted with catheter tip at the cavoatrial junction in satisfactory position. No bony abnormality is noted. IMPRESSION: Status post PICC line placement in satisfactory position. Electronically Signed   By: Alcide Clever M.D.   On: 05/15/2016 12:40    Cardiac Studies   TTE (05/14/16): Study Conclusions  - Left ventricle: The cavity size was normal. There was mild concentric hypertrophy. Systolic function was normal. The estimated ejection fraction was in the range of 55% to 60%. Wall motion was normal; there were no regional wall motion abnormalities. - Aortic valve: There was mild stenosis. Mean gradient (S): 10 mm Hg. Valve area (Vmax): 1.64 cm^2. - Left atrium: The atrium was mildly dilated. - Right atrium: The atrium was mildly dilated. - Pulmonary arteries: Systolic pressure was mildly increased. PA peak pressure: 45 mm Hg (S).  Patient Profile     61 y.o. female with history of DM, coronary calcification (noted on CT 2016), obesity, HTN, hyperlipidemia who presented to [facility] with LLE cellulitis and was found to be in new onset Afib with RVR.  Assessment & Plan    1. New onset Afib with RVR: -Remains in Afib -Suboptimal rate control persists -Preserved LVEF with mild pulmonary hypertension -Add digoxin 0.5 mg IV x 1 today, may need PO digoxin 0.125 mg daily starting on 5/11 with planned follow up digoxin level and bmet  -Continue short-acting diltiazem 30 mg q 6 hours -Continue metoprolol to 50 mg bid, if needed for added rate control change metoprolol to 25 mg q 6 hours -If HR remains suboptimally controlled, may need TEE/DCCV prior to discharge -Will need to ambulate prior to discharge to assess for rate control as well -Eliquis 5 mg bid -Consider outpatient sleep study -Recent nuclear stress test in 2016 negative -Likely in the setting of her LLE  cellulitis -TSH normal  2. Coronary atherosclerosis: -No chest pain -Recommend risk factor modification: Diabetes control, hypertension,hyperlipidemia, obesity -Eliquis in place of ASA -Crestor   3. HTN: -Continue Lopressor 50 mg bid as well as short-acting diltiazem 30 mg q 6 hours -If needed for heart rate control consider making metoprolol q 6 hours if BP will allow for this change  4. Hypokalemia: -Repleted to goal of 4.0 -Mg improved -Bmet pending this morning  5. Shortness of breath: -Mild DOE improved. Peripheral edema noted, as well as elevated pulmonary artery pressure -Suspect element of diastolic heart failure and/or right heart failure -Continue furosemide 40  mg PO daily -Rate control for Afib as above  Signed, Eula Listen, PA-C [facility] Pager: [phone] 05/17/2016, 11:22 AM   Attending Note Patient seen and examined, agree with detailed note above,  Patient presentation and plan discussed on rounds.   Patient denies any significant problems on today's visit Feels happy to use a bedpan Reports she is trying to get some sleep Difficulty maneuvering in the bed, deconditioned Suggested she get up to the bedside commode She required 2 helpers to get out of bed as she was unable to move herself She received digoxin 0.5 mg IV push this morning with improvement of her rate Telemetry reviewed shows she remains in atrial fibrillation  On physical exam she is morbidly obese, no JVP, irregularly irregular heart rate rate of 90 at rest, lungs clear to auscultation, abdomen obese, soft nontender, no significant lower extremity edema, wrapping on her left foot  Lab work reviewed showing stable BMP, elevated glucose levels to 30 Previous hematocrit 27  ----Atrial fibrillation Would continue metoprolol 50 mg twice a day Change diltiazem to 180 mg daily extended release Continue digoxin start 0.25 mg tomorrow Continue anticoagulation After one month  would consider cardioversion if symptomatic High risk of recurrence of her atrial fibrillation given her morbid obesity  ----Morbid obesity/deconditioning Would suggest she go to skilled nursing facility Unable to get out of bed, requiring 2 person assistance to get the bedside commode  Discussed case with hospitalist service, case management Greater than 50% was spent in counseling and coordination of care with patient Total encounter time 35 minutes or more   Signed: Dossie Arbour  M.D., Ph.D. [facility]

## 2016-05-17 NOTE — Progress Notes (Signed)
PT Cancellation Note  Patient Details Name: Laura MiniumSabrina Kline MRN: [id number] DOB: 09/19/1955   Cancelled Treatment:    Reason Eval/Treat Not Completed: Patient declined, no reason specified. Pt stated that she has not slept at all and [facility] and she is too exhausted to work with PT at this time. Pt stated that tomorrow would be a better time. PT will re-attempt PT session at a later date/time as able.    Tetsuo Coppola, SPT 05/17/2016, 2:39 PM

## 2016-05-17 NOTE — Care Management (Signed)
Anticipate discharge home on eliquis.  Even though it is now anticipated patient will discharge to snf, provided patient with eliquis 30 day trial coupon and subsequent copay assist coupons since she has Nurse, learning disabilitycommercial insurance.

## 2016-05-17 NOTE — Clinical Social Work Note (Signed)
CSW received consult that patient will need SNF.  CSW met with patient and her husband to explain to them what to expect with SNF and how insurance will pay for stay.  Patient was hesitant but was in agreement for CSW to begin bed search process .  CSW to begin bed search for SNF placement.  Jones Broom. [place], MSW, [place]  05/17/2016 12:21 PM

## 2016-05-17 NOTE — Plan of Care (Signed)
Problem: Safety: Goal: Ability to remain free from injury will improve Outcome: Progressing Fall precautions in place, non skid socks  Problem: Pain Managment: Goal: General experience of comfort will improve Outcome: Progressing Prn medications, no complaints of pain this shift thus far  Problem: Physical Regulation: Goal: Will remain free from infection Outcome: Progressing Change to PO antibiotics  Problem: Activity: Goal: Risk for activity intolerance will decrease Outcome: Not Progressing Needs to work with PT but heart rate elevated

## 2016-05-17 NOTE — Progress Notes (Addendum)
[facility] - [place] [facility]   PATIENT NAME: Laura Kline    MR#:  [id number]  DATE OF BIRTH:  11/30/1955  SUBJECTIVE:  CHIEF COMPLAINT:  Patient resting comfortably , still in afib with RVR  REVIEW OF SYSTEMS:  CONSTITUTIONAL: No fever, fatigue or weakness.  EYES: No blurred or double vision.  EARS, NOSE, AND THROAT: No tinnitus or ear pain.  RESPIRATORY: No cough, shortness of breath, wheezing or hemoptysis.  CARDIOVASCULAR: No chest pain, orthopnea, edema.  GASTROINTESTINAL: No nausea, vomiting, diarrhea or abdominal pain.  GENITOURINARY: No dysuria, hematuria.  ENDOCRINE: No polyuria, nocturia,  HEMATOLOGY: No anemia, easy bruising or bleeding SKIN: No rash or lesion. MUSCULOSKELETAL: No joint pain or arthritis.   NEUROLOGIC: No tingling, numbness, weakness.  PSYCHIATRY: No anxiety or depression.   DRUG ALLERGIES:   Allergies  Allergen Reactions  . Rofecoxib Other (See Comments)  . Penicillins Rash    VITALS:  Blood pressure 116/72, pulse (!) 123, temperature 99 F (37.2 C), temperature source Oral, resp. rate 16, height 5\' 3"  (1.6 m), weight (!) 143.5 kg (316 lb 4.8 oz), SpO2 90 %.  PHYSICAL EXAMINATION:  GENERAL:  61 y.o.-year-old patient lying in the bed with no acute distress.  EYES: Pupils equal, round, reactive to light and accommodation. No scleral icterus. Extraocular muscles intact.  HEENT: Head atraumatic, normocephalic. Oropharynx and nasopharynx clear.  NECK:  Supple, no jugular venous distention. No thyroid enlargement, no tenderness.  LUNGS: Normal breath sounds bilaterally, no wheezing, rales,rhonchi or crepitation. No use of accessory muscles of respiration.  CARDIOVASCULAR: Irregularly irregular. No murmurs, rubs, or gallops.  ABDOMEN: Soft, nontender, nondistended. Bowel sounds present. No organomegaly or mass.  EXTREMITIES: Left foot status post surgery , including dressing No pedal edema, cyanosis, or clubbing.   NEUROLOGIC: Cranial nerves II through XII are intact. Muscle strength 5/5 in all extremities. Sensation intact. Gait not checked.  PSYCHIATRIC: The patient is alert and oriented x 3.  SKIN: No obvious rash, lesion, or ulcer.    LABORATORY PANEL:   CBC  Recent Labs Lab 05/16/16 0010  WBC 11.2*  HGB 9.3*  HCT 27.5*  PLT 264   ------------------------------------------------------------------------------------------------------------------  Chemistries   Recent Labs Lab 05/12/16 1130  05/16/16 0010 05/17/16 1217  NA 132*  < > 137 135  K 3.6  < > 4.3 3.9  CL 96*  < > 107 102  CO2 22  < > 24 26  GLUCOSE 255*  < > 227* 232*  BUN 22*  < > 23* 19  CREATININE 1.39*  < > 0.98 0.98  CALCIUM 8.3*  < > 7.6* 8.5*  MG  --   < > 1.8  --   AST 17  --   --   --   ALT 11*  --   --   --   ALKPHOS 56  --   --   --   BILITOT 1.2  --   --   --   < > = values in this interval not displayed. ------------------------------------------------------------------------------------------------------------------  Cardiac Enzymes  Recent Labs Lab 05/12/16 2329  TROPONINI <0.03   ------------------------------------------------------------------------------------------------------------------  RADIOLOGY:  No results found.  EKG:   Orders placed or performed during the hospital encounter of 05/12/16  . ED EKG 12-Lead  . ED EKG 12-Lead  . EKG 12-Lead  . EKG 12-Lead    ASSESSMENT AND PLAN:   61 year old female with past medical history of coronary artery disease, diabetes, peripheral vascular disease, hypertension, hyperlipidemia  who presents to the hospital due to left lower extremity redness swelling and pain and noted to have cellulitis.  1. Sepsis-patient meets criteria given patient's elevated lactic acid, leukocytosis and left lower extremity findings suggestive of cellulitis. -d/c broad-spectrum IV antibiotics with vancomycin, cefepime. Pt is started on Bactrim  -blood  cultures negative , wound culturesWith abundant gram-positive cocci ,Staph aureus, Patient is started on Bactrim as she is allergic to penicillin  status post I  AND D of the left foot by podiatry, Dressing changed by podiatry  -Urine culture no growth  2. Left lower extremity cellulitis/possible osteomyelitis-this is the cause of patient's sepsis. - IV Vanco, Cefepime discontinued patient is started on by mouth Bactrim Status post incision and drainage of the left foot follow-up with podiatry and cultures. MRI is negative for osteomyelitis -. Wound packing  and  wet-to-dry saline gauze.   3. Atrial fibrillation with rapid ventricular response-this is new onset for the patient. Suspect this is probably initiated due to the underlying sepsis. - weaned off Cardizem drip,  On metoprolol 50 mg by mouth every 12 hours . Cardizem sr 180  mg by mouth , digoxin is ordered  -None trending cardiac biomarkers. Follow-up with cardiology. Pts CHADS2-VASc=3 started Eliquis 5 mg twice a day   4. Acute kidney injury-secondary to sepsis -Improved. Discontinue IV fluids -Follow BUN/creatinine creatinine 1.11-1.09 - 0.9. We'll resume hydrochlorothiazide and hold his lisinopril  5. Hyperlipidemia-continue Crestor.  6. Leukocytosis-secondary to sepsis and cellulitis. Follow with IV antibiotic therapy.  7.Essential hypertension-we'll continue metoprolol.d/c HCTZ, Cardizem CD 180 mg is added to the regimen holding his lisinopril   Consult physical therapy for deconditioning. Encouraged out of bed   All the records are reviewed and case discussed with Care Management/Social Workerr. Management plans discussed with the patient, family and they are in agreement.  CODE STATUS: FC   TOTAL TIME TAKING CARE OF THIS PATIENT: 36  minutes.   POSSIBLE D/C IN 1 -2  DAYS, DEPENDING ON CLINICAL CONDITION.  Note: This dictation was prepared with Dragon dictation along with smaller phrase technology. Any  transcriptional errors that result from this process are unintentional.   Ramonita Lab M.D on 05/17/2016 at 1:43 PM  Between 7am to 6pm - Pager - [phone] After 6pm go to [email or web address] - password [email or web address]  [facility]  Office  [phone]  CC: Primary care physician; Patient, No Pcp Per

## 2016-05-17 NOTE — [place] FL2 (Signed)
[place] MEDICAID FL2 LEVEL OF CARE SCREENING TOOL     IDENTIFICATION  Patient Name: Laura MiniumSabrina Kline Birthdate: 12/30/1955 Sex: female Admission Date (Current Location): 05/12/2016  [place]   Facility and Address:  [facility], [address], [place], [place] [address]      Provider Number: [id number]  Attending Physician Name and Address:  Ramonita LabGouru, Kyrsten Deleeuw, MD  Relative Name and Phone Number:  Kennyth ArnoldMurphy,[name] W Spouse [phone]     Current Level of Care: Hospital Recommended Level of Care: Skilled Nursing Facility Prior Approval Number:    Date Approved/Denied:   PASRR Number: [id number]  Discharge Plan: SNF    Current Diagnoses: Patient Active Problem List   Diagnosis Date Noted  . Sepsis (HCC) 05/12/2016  . Hyperlipidemia   . Essential hypertension   . Coronary atherosclerosis   . Chest pain 07/26/2014    Orientation RESPIRATION BLADDER Height & Weight     Time, Situation, Place, Self  Normal Continent Weight: (!) 316 lb 4.8 oz (143.5 kg) Height:  5\' 3"  (160 cm)  BEHAVIORAL SYMPTOMS/MOOD NEUROLOGICAL BOWEL NUTRITION STATUS      Continent Diet  AMBULATORY STATUS COMMUNICATION OF NEEDS Skin   Limited Assist Verbally Surgical wounds                       Personal Care Assistance Level of Assistance  Feeding, Dressing, Total care, Bathing Bathing Assistance: Limited assistance Feeding assistance: Independent Dressing Assistance: Limited assistance Total Care Assistance: Limited assistance   Functional Limitations Info  Sight, Hearing Sight Info: Adequate Hearing Info: Adequate      SPECIAL CARE FACTORS FREQUENCY  PT (By licensed PT), OT (By licensed OT)     PT Frequency: 5x  a week OT Frequency: 5x a week            Contractures      Additional Factors Info  Code Status, Allergies, Insulin Sliding Scale Code Status Info: Full Code Allergies Info: ROFECOXIB, PENICILLINS    Insulin  Sliding Scale Info: insulin aspart (novoLOG) injection 0-9 Units 3x a day with meals       Current Medications (05/17/2016):  This is the current hospital active medication list Current Facility-Administered Medications  Medication Dose Route Frequency Provider Last Rate Last Dose  . acetaminophen (TYLENOL) tablet 650 mg  650 mg Oral Q6H PRN [place] SirenSainani, Vivek J, MD   650 mg at 05/14/16 1125   Or  . acetaminophen (TYLENOL) suppository 650 mg  650 mg Rectal Q6H PRN Houston SirenSainani, Vivek J, MD      . apixaban (ELIQUIS) tablet 5 mg  5 mg Oral BID Cindi CarbonSwayne, Mary M, RPH   5 mg at 05/17/16 [id number]  . [START ON 05/18/2016] digoxin (LANOXIN) tablet 0.125 mg  0.125 mg Oral Daily Dunn, Ryan M, PA-C      . diltiazem (CARDIZEM) tablet 30 mg  30 mg Oral Q6H Dunn, Ryan M, PA-C   30 mg at 05/17/16 [id number]  . furosemide (LASIX) tablet 40 mg  40 mg Oral Daily End, Christopher, MD   40 mg at 05/17/16 [id number]  . hydrochlorothiazide (HYDRODIURIL) tablet 25 mg  25 mg Oral Daily Xyler Terpening, MD   25 mg at 05/17/16 0921  . HYDROcodone-acetaminophen (NORCO) 7.5-325 MG per tablet 1 tablet  1 tablet Oral Q6H PRN Recardo Evangelistroxler, Matthew, DPM   1 tablet at 05/17/16 0150  . insulin aspart (novoLOG) injection 0-5 Units  0-5 Units Subcutaneous QHS Sainani, Vivek J,  MD   2 Units at 05/13/16 2126  . insulin aspart (novoLOG) injection 0-9 Units  0-9 Units Subcutaneous TID WC Houston Siren, MD   2 Units at 05/17/16 1222  . insulin glargine (LANTUS) injection 58 Units  58 Units Subcutaneous QHS Houston Siren, MD   58 Units at 05/16/16 2142  . metoprolol (LOPRESSOR) tablet 50 mg  50 mg Oral BID Houston Siren, MD   50 mg at 05/17/16 1610  . ondansetron (ZOFRAN) tablet 4 mg  4 mg Oral Q6H PRN Houston Siren, MD       Or  . ondansetron (ZOFRAN) injection 4 mg  4 mg Intravenous Q6H PRN Houston Siren, MD   4 mg at 05/12/16 1617  . rosuvastatin (CRESTOR) tablet 10 mg  10 mg Oral QPC supper Houston Siren, MD   10 mg at 05/16/16 1706  . sodium  chloride flush (NS) 0.9 % injection 10-40 mL  10-40 mL Intracatheter Q12H Sergey Ishler, MD   10 mL at 05/17/16 0922  . sodium chloride flush (NS) 0.9 % injection 10-40 mL  10-40 mL Intracatheter PRN Paris Hohn, MD      . sulfamethoxazole-trimethoprim (BACTRIM DS,SEPTRA DS) 800-160 MG per tablet 1 tablet  1 tablet Oral Q12H Adrine Hayworth, Deanna Artis, MD   1 tablet at 05/17/16 9604     Discharge Medications: Please see discharge summary for a list of discharge medications.  Relevant Imaging Results:  Relevant Lab Results:   Additional Information SSN [id number]  Darleene Cleaver, [place]

## 2016-05-18 ENCOUNTER — Telehealth: Payer: Self-pay | Admitting: Cardiovascular Disease

## 2016-05-18 DIAGNOSIS — I4891 Unspecified atrial fibrillation: Secondary | ICD-10-CM

## 2016-05-18 DIAGNOSIS — L03116 Cellulitis of left lower limb: Secondary | ICD-10-CM

## 2016-05-18 LAB — CBC WITH DIFFERENTIAL/PLATELET
BASOS ABS: 0 10*3/uL (ref 0–0.1)
BASOS PCT: 0 %
Basophils Absolute: 0 10*3/uL (ref 0–0.1)
Basophils Relative: 0 %
EOS ABS: 0.2 10*3/uL (ref 0–0.7)
EOS ABS: 0.2 10*3/uL (ref 0–0.7)
EOS PCT: 2 %
EOS PCT: 2 %
HCT: 29.7 % — ABNORMAL LOW (ref 35.0–47.0)
HCT: 29.8 % — ABNORMAL LOW (ref 35.0–47.0)
HEMOGLOBIN: 10.4 g/dL — AB (ref 12.0–16.0)
Hemoglobin: 10.4 g/dL — ABNORMAL LOW (ref 12.0–16.0)
LYMPHS ABS: 0.6 10*3/uL — AB (ref 1.0–3.6)
LYMPHS PCT: 6 %
Lymphocytes Relative: 7 %
Lymphs Abs: 0.7 10*3/uL — ABNORMAL LOW (ref 1.0–3.6)
MCH: 32.9 pg (ref 26.0–34.0)
MCH: 33 pg (ref 26.0–34.0)
MCHC: 34.9 g/dL (ref 32.0–36.0)
MCHC: 35.1 g/dL (ref 32.0–36.0)
MCV: 94.2 fL (ref 80.0–100.0)
MCV: 93.9 fL (ref 80.0–100.0)
MONO ABS: 1 10*3/uL — AB (ref 0.2–0.9)
MONOS PCT: 9 %
Monocytes Absolute: 0.9 10*3/uL (ref 0.2–0.9)
Monocytes Relative: 9 %
NEUTROS PCT: 83 %
Neutro Abs: 8.3 10*3/uL — ABNORMAL HIGH (ref 1.4–6.5)
Neutro Abs: 8.6 10*3/uL — ABNORMAL HIGH (ref 1.4–6.5)
Neutrophils Relative %: 82 %
PLATELETS: 335 10*3/uL (ref 150–440)
Platelets: 327 10*3/uL (ref 150–440)
RBC: 3.16 MIL/uL — ABNORMAL LOW (ref 3.80–5.20)
RBC: 3.16 MIL/uL — AB (ref 3.80–5.20)
RDW: 13.1 % (ref 11.5–14.5)
RDW: 13.3 % (ref 11.5–14.5)
WBC: 10.1 10*3/uL (ref 3.6–11.0)
WBC: 10.5 10*3/uL (ref 3.6–11.0)

## 2016-05-18 LAB — BASIC METABOLIC PANEL
Anion gap: 6 (ref 5–15)
BUN: 20 mg/dL (ref 6–20)
CHLORIDE: 99 mmol/L — AB (ref 101–111)
CO2: 28 mmol/L (ref 22–32)
CREATININE: 1.1 mg/dL — AB (ref 0.44–1.00)
Calcium: 8.5 mg/dL — ABNORMAL LOW (ref 8.9–10.3)
GFR calc Af Amer: >60 mL/min (ref >60)
GFR calc non Af Amer: 53 mL/min — ABNORMAL LOW (ref >60)
GLUCOSE: 146 mg/dL — AB (ref 65–99)
POTASSIUM: 3.9 mmol/L (ref 3.5–5.1)
SODIUM: 133 mmol/L — AB (ref 135–145)

## 2016-05-18 LAB — GLUCOSE, CAPILLARY
GLUCOSE-CAPILLARY: 215 mg/dL — AB (ref 65–99)
GLUCOSE-CAPILLARY: 92 mg/dL (ref 65–99)
GLUCOSE-CAPILLARY: 193 mg/dL — AB (ref 65–99)
Glucose-Capillary: 167 mg/dL — ABNORMAL HIGH (ref 65–99)

## 2016-05-18 MED ORDER — HYDROCODONE-ACETAMINOPHEN 7.5-325 MG PO TABS: 1.0000 | ORAL_TABLET | Freq: Four times a day (QID) | ORAL | 0 refills | Status: AC | PRN

## 2016-05-18 MED ORDER — APIXABAN 5 MG PO TABS: 5.0000 mg | ORAL_TABLET | Freq: Two times a day (BID) | ORAL | 0 refills | Status: AC

## 2016-05-18 MED ORDER — DILTIAZEM HCL ER COATED BEADS 180 MG PO CP24: 180.0000 mg | ORAL_CAPSULE | Freq: Every day | ORAL | Status: AC

## 2016-05-18 MED ORDER — FUROSEMIDE 40 MG PO TABS: 40.0000 mg | ORAL_TABLET | Freq: Every day | ORAL | Status: AC

## 2016-05-18 MED ORDER — SENNOSIDES-DOCUSATE SODIUM 8.6-50 MG PO TABS: 1.0000 | ORAL_TABLET | Freq: Two times a day (BID) | ORAL | Status: DC

## 2016-05-18 MED ORDER — ONDANSETRON HCL 4 MG PO TABS: 4.0000 mg | ORAL_TABLET | Freq: Four times a day (QID) | ORAL | 0 refills | Status: AC | PRN

## 2016-05-18 MED ORDER — SULFAMETHOXAZOLE-TRIMETHOPRIM 800-160 MG PO TABS: 1.0000 | ORAL_TABLET | Freq: Two times a day (BID) | ORAL | 0 refills | Status: DC

## 2016-05-18 MED ORDER — SENNOSIDES-DOCUSATE SODIUM 8.6-50 MG PO TABS: 1.0000 | ORAL_TABLET | Freq: Two times a day (BID) | ORAL | Status: AC

## 2016-05-18 MED ORDER — DIGOXIN 250 MCG PO TABS: 0.2500 mg | ORAL_TABLET | Freq: Every day | ORAL | Status: AC

## 2016-05-18 MED ORDER — INSULIN ASPART 100 UNIT/ML ~~LOC~~ SOLN: 0.0000 [IU] | Freq: Every day | SUBCUTANEOUS | 11 refills | Status: DC

## 2016-05-18 MED ORDER — DIGOXIN 250 MCG PO TABS
0.2500 mg | ORAL_TABLET | Freq: Every day | ORAL | Status: DC
  Administered 2016-05-18: 0.25 mg via ORAL
  Filled 2016-05-18: qty 1

## 2016-05-18 NOTE — Clinical Social Work Note (Signed)
Clinical Social Work Assessment  Patient Details  Name: Laura MiniumSabrina Churilla MRN: [id number] Date of Birth: 03/22/1955  Date of referral:  05/17/16               Reason for consult:  Facility Placement                Permission sought to share information with:  Facility Medical sales representativeContact Representative, Family Supports Permission granted to share information::  Yes, Verbal Permission Granted  Name::     Kennyth ArnoldMurphy,[name] W Spouse [phone]   Agency::  SNF admissions  Relationship::     Contact Information:     Housing/Transportation Living arrangements for the past 2 months:  Single Family Home Source of Information:  Patient Patient Interpreter Needed:  None Criminal Activity/Legal Involvement Pertinent to Current Situation/Hospitalization:  No - Comment as needed Significant Relationships:  Spouse Lives with:  Spouse Do you feel safe going back to the place where you live?  No Need for family participation in patient care:  No (Coment)  Care giving concerns:  Patient and spouse feel that she needs SNF placement for rehab before returning back home.   Social Worker assessment / plan:  Patient is a 61 year old female who is alert and oriented x4 and able to express her feelings.  Patient is married and lives with her husband.  Patient states she has not been to rehab before CSW explained to patient what to expect at SNF and how insurance will pay for her stay.  Patient was informed that pending insurance approval will make determination where she is able to go to SNF for short term rehab.  Patient and her husband did not express any other questions or concerns and gave CSW permission [place].  Employment status:  Retired Health and safety inspectornsurance information:  Managed Care PT Recommendations:  Skilled Nursing Facility Information / Referral to community resources:  Skilled Nursing Facility  Patient/Family's Response to care: Patient and family agreeable to going to SNF for short term  rehab.  Patient/Family's Understanding of and Emotional Response to Diagnosis, Current Treatment, and Prognosis:  Patient is aware of current treatment plan and diagnosis, and she is hopeful she will not have to be in SNF very long.  Emotional Assessment Appearance:  Appears stated age Attitude/Demeanor/Rapport:    Affect (typically observed):  Appropriate, Calm, Pleasant Orientation:  Oriented to Self, Oriented to Place, Oriented to  Time Alcohol / Substance use:  Not Applicable Psych involvement (Current and /or in the community):  No (Comment)  Discharge Needs  Concerns to be addressed:  Lack of Support Readmission within the last 30 days:  No Current discharge risk:  Lack of support system Barriers to Discharge:  No Barriers Identified   [place] [employer], Vint Pola R, LCSWA 05/18/2016, 4:23 PM

## 2016-05-18 NOTE — Progress Notes (Signed)
ANTICOAGULATION CONSULT NOTE - Initial Consult  Pharmacy Consult for apixaban Indication: atrial fibrillation  Allergies  Allergen Reactions  . Rofecoxib Other (See Comments)  . Penicillins Rash    Patient Measurements: Height: 5\' 3"  (160 cm) Weight: (!) 316 lb 4.8 oz (143.5 kg) IBW/kg (Calculated) : 52.4  Vital Signs:    Labs:  Recent Labs  05/16/16 0010 05/17/16 1217 05/18/16 0019 05/18/16 0436  HGB 9.3*  --  10.4* 10.4*  HCT 27.5*  --  29.7* 29.8*  PLT 264  --  327 335  CREATININE 0.98 0.98 1.10*  --     Estimated Creatinine Clearance: 74.3 mL/min (A) (by C-G formula based on SCr of 1.1 mg/dL (H)).   Medical History: Past Medical History:  Diagnosis Date  . Coronary atherosclerosis    LAD calcifications noted on CT scan in 2016 with negative nuclear stress test.  . DKA (diabetic ketoacidoses) (HCC)    a. 05/2009  . Essential hypertension   . History of Bell's palsy   . Hyperlipidemia   . Hypertension   . Hypokalemia   . IDDM (insulin dependent diabetes mellitus) (HCC)   . Long-term insulin use (HCC)   . Morbid obesity (HCC)   . Peripheral vascular disease [facility](HCC)    Assessment: Pharmacy consulted to dose apixaban for atrial fibrillation (new onset).  Goal of Therapy:  Monitor platelets by anticoagulation protocol: Yes   Plan:  Continue apixaban 5 mg PO BID Counseled by pharmacist on 05/18/16  Cindi CarbonMary M Lindey Renzulli , PharmD 05/18/2016,12:48 PM

## 2016-05-18 NOTE — Discharge Summary (Signed)
[facility] [place] at [facility]   PATIENT NAME: Laura MiniumSabrina Gammage    MR#:  [id number]  DATE OF BIRTH:  10/26/1955  DATE OF ADMISSION:  05/12/2016 ADMITTING PHYSICIAN: Houston SirenVivek J Sainani, MD  DATE OF DISCHARGE: 05/18/16 PRIMARY CARE PHYSICIAN: Patient, No Pcp Per    ADMISSION DIAGNOSIS:  Atrial fibrillation with rapid ventricular response (HCC) [I48.91] Cellulitis of left lower extremity [L03.116] Sepsis, due to unspecified organism (HCC) [A41.9]  DISCHARGE DIAGNOSIS:  Active Problems:   Sepsis (HCC)   Morbid obesity (HCC)   Persistent atrial fibrillation (HCC)   Atrial fibrillation with rapid ventricular response (HCC)   Cellulitis of left lower extremity   SECONDARY DIAGNOSIS:   Past Medical History:  Diagnosis Date  . Coronary atherosclerosis    LAD calcifications noted on CT scan in 2016 with negative nuclear stress test.  . DKA (diabetic ketoacidoses) (HCC)    a. 05/2009  . Essential hypertension   . History of Bell's palsy   . Hyperlipidemia   . Hypertension   . Hypokalemia   . IDDM (insulin dependent diabetes mellitus) (HCC)   . Long-term insulin use (HCC)   . Morbid obesity (HCC)   . Peripheral vascular disease [facility](HCC)     HOSPITAL COURSE:  HPI Laura Kline  is a 61 y.o. female with a known history of Diabetes, coronary artery disease, obesity, hypertension, hyperlipidemia, peripheral vascular disease who presents to the hospital due to left lower extremity redness swelling and pain that has progressively gotten worse this past week. Patient noticed that her left foot was increasingly red and painful earlier this week and has progressively gotten worse. She developed some chills, but no documented fever. Patient also was noted to be tachycardic when arrival to the ER noted to be in atrial fibrillation with rapid response and is currently on a Cardizem drip. She came to the ER for further evaluation as her leg was getting worse and was diagnosed  with left leg cellulitis with a foreign body and sepsis, and hospitalist services were contacted further treatment and evaluation. She admits to a chronic exertional dyspnea, but denies any chest pains, nausea, vomiting, abdominal pain, diarrhea or any other associated symptoms presently.  1. Sepsis-patient meets criteria given patient's elevated lactic acid, leukocytosis and left lower extremity findings suggestive of cellulitis. -d/c broad-spectrum IV antibiotics with vancomycin, cefepime. Pt is started on Bactrim  -blood cultures negative , wound culturesWith abundant gram-positive cocci ,Staph aureus, Patient is started on Bactrim as she is allergic to penicillin  status post I  AND D of the left foot by podiatry, Dressing changed by podiatry , outpatient follow-up with podiatry -Urine culture no growth  2. Left lower extremitycellulitis/possible osteomyelitis-this is the cause of patient's sepsis. - IV Vanco, Cefepime discontinued patient is started on by mouth Bactrim Status post incision and drainage of the left foot follow-up with podiatry and cultures. MRI is negative for osteomyelitis -. Wound packing  and  wet-to-dry saline gauze dressing daily   3. Atrial fibrillation with rapid ventricular response-this is new onset for the patient. Suspect this is probably initiated due to the underlying sepsis. -Rate controlled but patient remains in atrial fibrillation. Cardiology is recommending to continue digoxin 0.25 mg once daily and Cardizem CD 180 mg once daily. Continue metoprolol 50 mg by mouth twice a day and eliquiS 5 mg by mouth twice a day outpatient follow-up with cardiology in 1 week - weaned off Cardizem drip-None trending cardiac biomarkers. Follow-up with cardiology. Pts CHADS2-VASc=3  4. Acute kidney injury-secondary to sepsis -Improved. Discontinue IV fluids -We'll  hold lisinopril  5. Hyperlipidemia-continue Crestor.  6. Leukocytosis-secondary to sepsis    7.Essential hypertension-we'll continue metoprolol.d/c HCTZ, Cardizem CD 180 mg is added to the regimen holding his lisinopril   Physical therapy recommended skilled nursing facility   DISCHARGE CONDITIONS:   fair  CONSULTS OBTAINED:  Treatment Team:  Recardo Evangelist, Fausto Skillern, MD   PROCEDURES foot Incision and drainage  DRUG ALLERGIES:   Allergies  Allergen Reactions  . Rofecoxib Other (See Comments)  . Penicillins Rash    DISCHARGE MEDICATIONS:   Current Discharge Medication List    START taking these medications   Details  apixaban (ELIQUIS) 5 MG TABS tablet Take 1 tablet (5 mg total) by mouth 2 (two) times daily. Qty: 60 tablet, Refills: 0    digoxin (LANOXIN) 0.25 MG tablet Take 1 tablet (0.25 mg total) by mouth daily.    diltiazem (CARDIZEM CD) 180 MG 24 hr capsule Take 1 capsule (180 mg total) by mouth daily.    furosemide (LASIX) 40 MG tablet Take 1 tablet (40 mg total) by mouth daily. Qty: 30 tablet    HYDROcodone-acetaminophen (NORCO) 7.5-325 MG tablet Take 1 tablet by mouth every 6 (six) hours as needed for moderate pain. Qty: 30 tablet, Refills: 0    insulin aspart (NOVOLOG) 100 UNIT/ML injection Inject 0-5 Units into the skin at bedtime. Qty: 10 mL, Refills: 11    ondansetron (ZOFRAN) 4 MG tablet Take 1 tablet (4 mg total) by mouth every 6 (six) hours as needed for nausea. Qty: 20 tablet, Refills: 0    senna-docusate (SENOKOT-S) 8.6-50 MG tablet Take 1 tablet by mouth 2 (two) times daily.    sulfamethoxazole-trimethoprim (BACTRIM DS,SEPTRA DS) 800-160 MG tablet Take 1 tablet by mouth every 12 (twelve) hours. Qty: 28 tablet, Refills: 0      CONTINUE these medications which have NOT CHANGED   Details  insulin aspart (NOVOLOG FLEXPEN) 100 UNIT/ML FlexPen Inject 10-16 Units into the skin 3 (three) times daily with meals. Per sliding scale    Insulin Glargine (LANTUS SOLOSTAR) 100 UNIT/ML Solostar Pen Inject 58 Units into the skin  at bedtime.     metFORMIN (GLUCOPHAGE-XR) 500 MG 24 hr tablet Take 2 tablets by mouth daily. With dinner    metoprolol (LOPRESSOR) 50 MG tablet Take 1 tablet (50 mg total) by mouth 2 (two) times daily. Qty: 180 tablet, Refills: 3    rosuvastatin (CRESTOR) 10 MG tablet Take 1 tablet (10 mg total) by mouth daily after supper. Qty: 90 tablet, Refills: 3      STOP taking these medications     aspirin EC 81 MG tablet      lisinopril-hydrochlorothiazide (PRINZIDE,ZESTORETIC) 20-25 MG tablet          DISCHARGE INSTRUCTIONS:   Follow-up with primary care physician or [facility] in a week.Please monitor digoxin levels Follow-up with podiatry Dr. Orland Jarred in a week Follow-up with cardiology Dr. Mariah Milling in a week Continue wound care on daily basis;  wet-to-dry saline gauze dressing daily per Podiatry    DIET:  Diabetic diet  DISCHARGE CONDITION:  Fair  ACTIVITY:  Activity as tolerated per PT   OXYGEN:  Home Oxygen: No.   Oxygen Delivery: room air  DISCHARGE LOCATION:  nursing home   If you experience worsening of your admission symptoms, develop shortness of breath, life threatening emergency, suicidal or homicidal thoughts you must seek medical attention immediately by calling 911  or calling your MD immediately  if symptoms less severe.  You Must read complete instructions/literature along with all the possible adverse reactions/side effects for all the Medicines you take and that have been prescribed to you. Take any new Medicines after you have completely understood and accpet all the possible adverse reactions/side effects.   Please note  You were cared for by a hospitalist during your hospital stay. If you have any questions about your discharge medications or the care you received while you were in the hospital after you are discharged, you can call the unit and asked to speak with the hospitalist on call if the hospitalist that took care of you is  not available. Once you are discharged, your primary care physician will handle any further medical issues. Please note that NO REFILLS for any discharge medications will be authorized once you are discharged, as it is imperative that you return to your primary care physician (or establish a relationship with a primary care physician if you do not have one) for your aftercare needs so that they can reassess your need for medications and monitor your lab values.     Today  Chief Complaint  Patient presents with  . Wound Infection   Patient is resting comfortably today denies any palpitations.  ROS:  CONSTITUTIONAL: Denies fevers, chills. Denies any fatigue, weakness.  EYES: Denies blurry vision, double vision, eye pain. EARS, NOSE, THROAT: Denies tinnitus, ear pain, hearing loss. RESPIRATORY: Denies cough, wheeze, shortness of breath.  CARDIOVASCULAR: Denies chest pain, palpitations, edema.  GASTROINTESTINAL: Denies nausea, vomiting, diarrhea, abdominal pain. Denies bright red blood per rectum. GENITOURINARY: Denies dysuria, hematuria. ENDOCRINE: Denies nocturia or thyroid problems. HEMATOLOGIC AND LYMPHATIC: Denies easy bruising or bleeding. SKIN: Denies rash or lesion. MUSCULOSKELETAL: Denies any pain in the neck and shoulders NEUROLOGIC: Denies paralysis, paresthesias.  PSYCHIATRIC: Denies anxiety or depressive symptoms.   VITAL SIGNS:  Blood pressure (!) 125/55, pulse 93, temperature 99.2 F (37.3 C), temperature source Oral, resp. rate 17, height 5\' 3"  (1.6 m), weight (!) 143.5 kg (316 lb 4.8 oz), SpO2 98 %.  I/O:    Intake/Output Summary (Last 24 hours) at 05/18/16 1354 Last data filed at 05/18/16 1328  Gross per 24 hour  Intake              840 ml  Output              300 ml  Net              540 ml    PHYSICAL EXAMINATION:  GENERAL:  61 y.o.-year-old patient lying in the bed with no acute distress.  EYES: Pupils equal, round, reactive to light and accommodation. No  scleral icterus. Extraocular muscles intact.  HEENT: Head atraumatic, normocephalic. Oropharynx and nasopharynx clear.  NECK:  Supple, no jugular venous distention. No thyroid enlargement, no tenderness.  LUNGS: Normal breath sounds bilaterally, no wheezing, rales,rhonchi or crepitation. No use of accessory muscles of respiration.  CARDIOVASCULAR: S1, S2 normal. No murmurs, rubs, or gallops.  ABDOMEN: Soft, non-tender, non-distended. Bowel sounds present. No organomegaly or mass.  EXTREMITIES:  Left foot status post surgery , including dressing No pedal edema, cyanosis, .  No pedal edema, cyanosis, or clubbing.  NEUROLOGIC: Cranial nerves II through XII are intact. Muscle strength 5/5 in all extremities. Sensation intact. Gait not checked.  PSYCHIATRIC: The patient is alert and oriented x 3.  SKIN: No obvious rash, lesion, or ulcer.   DATA REVIEW:   CBC  Recent Labs Lab 05/18/16 0436  WBC 10.5  HGB 10.4*  HCT 29.8*  PLT 335    Chemistries   Recent Labs Lab 05/12/16 1130  05/16/16 0010  05/18/16 0019  NA 132*  < > 137  < > 133*  K 3.6  < > 4.3  < > 3.9  CL 96*  < > 107  < > 99*  CO2 22  < > 24  < > 28  GLUCOSE 255*  < > 227*  < > 146*  BUN 22*  < > 23*  < > 20  CREATININE 1.39*  < > 0.98  < > 1.10*  CALCIUM 8.3*  < > 7.6*  < > 8.5*  MG  --   < > 1.8  --   --   AST 17  --   --   --   --   ALT 11*  --   --   --   --   ALKPHOS 56  --   --   --   --   BILITOT 1.2  --   --   --   --   < > = values in this interval not displayed.  Cardiac Enzymes  Recent Labs Lab 05/12/16 2329  TROPONINI <0.03    Microbiology Results  Results for orders placed or performed during the hospital encounter of 05/12/16  Blood Culture (routine x 2)     Status: None   Collection Time: 05/12/16 11:57 AM  Result Value Ref Range Status   Specimen Description BLOOD RIGHT ASSIST CONTROL  Final   Special Requests   Final    BOTTLES DRAWN AEROBIC AND ANAEROBIC Blood Culture adequate volume    Culture NO GROWTH 5 DAYS  Final   Report Status 05/17/2016 FINAL  Final  Blood Culture (routine x 2)     Status: None   Collection Time: 05/12/16 11:57 AM  Result Value Ref Range Status   Specimen Description BLOOD LEFT ARM  Final   Special Requests   Final    BOTTLES DRAWN AEROBIC AND ANAEROBIC Blood Culture results may not be optimal due to an excessive volume of blood received in culture bottles   Culture NO GROWTH 5 DAYS  Final   Report Status 05/17/2016 FINAL  Final  Aerobic/Anaerobic Culture (surgical/deep wound)     Status: None (Preliminary result)   Collection Time: 05/13/16  8:38 AM  Result Value Ref Range Status   Specimen Description WOUND LEFT FOOT  Final   Special Requests NONE  Final   Gram Stain   Final    MODERATE WBC PRESENT, PREDOMINANTLY PMN ABUNDANT GRAM POSITIVE COCCI IN PAIRS IN CLUSTERS    Culture   Final    MODERATE STAPHYLOCOCCUS AUREUS HOLDING FOR POSSIBLE ANAEROBE Performed at [facility], [address], [place], [place] [address]    Report Status PENDING  Incomplete   Organism ID, Bacteria STAPHYLOCOCCUS AUREUS  Final      Susceptibility   Staphylococcus aureus - MIC*    CIPROFLOXACIN <=0.5 SENSITIVE Sensitive     ERYTHROMYCIN >=8 RESISTANT Resistant     GENTAMICIN <=0.5 SENSITIVE Sensitive     OXACILLIN <=0.25 SENSITIVE Sensitive     TETRACYCLINE <=1 SENSITIVE Sensitive     VANCOMYCIN <=0.5 SENSITIVE Sensitive     TRIMETH/SULFA <=10 SENSITIVE Sensitive     CLINDAMYCIN <=0.25 SENSITIVE Sensitive     RIFAMPIN <=0.5 SENSITIVE Sensitive     Inducible Clindamycin NEGATIVE Sensitive     *  MODERATE STAPHYLOCOCCUS AUREUS  Urine culture     Status: None   Collection Time: 05/13/16  5:39 PM  Result Value Ref Range Status   Specimen Description URINE, CLEAN CATCH  Final   Special Requests NONE  Final   Culture   Final    NO GROWTH Performed at [facility], [address], [place], [place] [address]    Report Status 05/15/2016 FINAL   Final    RADIOLOGY:  Dg Chest Port 1 View  Result Date: 05/15/2016 CLINICAL DATA:  Status post PICC line placement on the right EXAM: PORTABLE CHEST 1 VIEW COMPARISON:  07/26/2014 FINDINGS: Cardiac shadow is stable. Lungs are hypoinflated but clear. A new right-sided PICC line is noted with catheter tip at the cavoatrial junction in satisfactory position. No bony abnormality is noted. IMPRESSION: Status post PICC line placement in satisfactory position. Electronically Signed   By: Alcide Clever M.D.   On: 05/15/2016 12:40    EKG:   Orders placed or performed during the hospital encounter of 05/12/16  . ED EKG 12-Lead  . ED EKG 12-Lead  . EKG 12-Lead  . EKG 12-Lead      Management plans discussed with the patient, family and they are in agreement.  CODE STATUS:     Code Status Orders        Start     Ordered   05/12/16 1529  Full code  Continuous     05/12/16 1528    Code Status History    Date Active Date Inactive Code Status Order ID Comments User Context   07/26/2014 12:21 PM 07/28/2014  6:58 PM Full Code [id number]  Katha Hamming, MD ED      TOTAL TIME TAKING CARE OF THIS PATIENT: 45  minutes.   Note: This dictation was prepared with Dragon dictation along with smaller phrase technology. Any transcriptional errors that result from this process are unintentional.   @[facility] @  on 05/18/2016 at 1:54 PM  Between 7am to 6pm - Pager - [phone]  After 6pm go to [email or web address] - password [email or web address]  [facility]  Office  [phone]  CC: Primary care physician; Patient, No Pcp Per

## 2016-05-18 NOTE — Clinical Social Work Note (Addendum)
CSW presented bed offers to patient and she chose [facility].  [facility] [place] from [place], and can accept patient today.  Patient to be d/c'ed today to [facility].  Patient and family agreeable to plans will transport via ems RN to call report.  Windell MouldingEric Neosha Switalski, MSW, Theresia MajorsLCSWA [phone]

## 2016-05-18 NOTE — Discharge Instructions (Signed)
Follow-up with primary care physician or [facility] in a week Follow-up with podiatry Dr. Orland Jarredroxler in a week Follow-up with cardiology Dr. Mariah MillingGollan in a week Continue wound care on daily basis;

## 2016-05-18 NOTE — Progress Notes (Signed)
Dressing changed. To SNF. F/U with Dr. Orland Jarredroxler in 1-2 weeks.

## 2016-05-18 NOTE — Progress Notes (Signed)
Physical Therapy Treatment Patient Details Name: Laura Kline MRN: [id number] DOB: 1955-11-27 Today's Date: 05/18/2016    History of Present Illness Pt is a 61 y.o. female admitted on 05/12/16 for sepsis secondary to a cellulitis of a wound on her L dorsal forefoot and a metalic foreign body in the first web space, s/p irrigation and debredment on 05/13/16. Pt also presented with new A-fib with RVR during admission and has a PMH of coronary atherosclerosis, DKA, essential HTN, hx of Bell's palsey, HLD, insulin dependent DM, and PVD.    PT Comments    Pt appearing anxious during session and HR fluctuating from 91-115 bpm with activities.  Pt incontinent of urine requiring clean-up upon PT entering room (logrolling L and R in bed SBA with heavy use of bedrail); nursing tech present during session assisting.  Pt requiring 1-2 assist with bed mobility and was unable to stand with 2 assist and use of bariatric RW with multiple attempts and cueing so pt assisted back to bed.  Continue to recommend pt discharge to STR when medically appropriate.    Follow Up Recommendations  SNF     Equipment Recommendations  Rolling walker with 5" wheels;3in1 (PT) (bariatric RW and BSC)    Recommendations for Other Services       Precautions / Restrictions Precautions Precautions: Fall Restrictions Weight Bearing Restrictions: Yes (L LE) Other Position/Activity Restrictions: Heel weight bearing with orthowedge shoe donned with RW    Mobility  Bed Mobility Overal bed mobility: Needs Assistance Bed Mobility: Supine to Sit;Sit to Supine     Supine to sit: Mod assist Sit to supine: Min assist;Mod assist;+2 for physical assistance   General bed mobility comments: Supine to sit with HOB elevated assist for trunk and vc's to use side rail; sit to supine assist for trunk and B LE's  Transfers Overall transfer level: Needs assistance Equipment used: Rolling walker (2 wheeled) Transfers: Sit to/from  Stand Sit to Stand: Max assist;+2 physical assistance;From elevated surface         General transfer comment: attempted to stand x4 trials from edge of bed but pt appearing very anxious and requiring cueing for hand and feet placement plus assist to initiate stand (unable to clear pt's bottom from bed with 2 assist)  Ambulation/Gait             General Gait Details: not appropriate at this time   Stairs            Wheelchair Mobility    Modified Rankin (Stroke Patients Only)       Balance Overall balance assessment: Needs assistance Sitting-balance support: Bilateral upper extremity supported;Feet supported Sitting balance-Leahy Scale: Good Sitting balance - Comments: sitting reaching within BOS                                    Cognition Arousal/Alertness: Awake/alert Behavior During Therapy: Anxious Overall Cognitive Status: Within Functional Limits for tasks assessed                                        Exercises      General Comments General comments (skin integrity, edema, etc.): L foot with dressings intact.  Pt agreeable to PT session.      Pertinent Vitals/Pain Pain Assessment: No/denies pain    Home Living  Prior Function            PT Goals (current goals can now be found in the care plan section) Acute Rehab PT Goals Patient Stated Goal: to go home PT Goal Formulation: With patient Time For Goal Achievement: 05/30/16 Potential to Achieve Goals: Fair Progress towards PT goals: Progressing toward goals    Frequency    Min 2X/week      PT Plan Current plan remains appropriate    Co-evaluation              AM-PAC PT "6 Clicks" Daily Activity  Outcome Measure  Difficulty turning over in bed (including adjusting bedclothes, sheets and blankets)?: A Little Difficulty moving from lying on back to sitting on the side of the bed? : Total Difficulty sitting down  on and standing up from a chair with arms (e.g., wheelchair, bedside commode, etc,.)?: Total Help needed moving to and from a bed to chair (including a wheelchair)?: Total Help needed walking in hospital room?: Total Help needed climbing 3-5 steps with a railing? : Total 6 Click Score: 8    End of Session Equipment Utilized During Treatment: Gait belt Activity Tolerance: Patient limited by fatigue Patient left: in bed;with call bell/phone within reach;with bed alarm set (L LE elevated on pillow) Nurse Communication: Mobility status;Precautions;Weight bearing status PT Visit Diagnosis: Muscle weakness (generalized) (M62.81);Pain;Difficulty in walking, not elsewhere classified (R26.2) Pain - Right/Left: Left Pain - part of body: Ankle and joints of foot     Time: 1445-1500 PT Time Calculation (min) (ACUTE ONLY): 15 min  Charges:  $Therapeutic Activity: 8-22 mins                    G CodesHendricks Limes:       Tara Rud, PT 05/18/16, 3:17 PM [phone]

## 2016-05-18 NOTE — Clinical Social Work Placement (Signed)
   CLINICAL SOCIAL WORK PLACEMENT  NOTE  Date:  05/18/2016  Patient Details  Name: Laura MiniumSabrina Stidd MRN: [id number] Date of Birth: 12/02/1955  Clinical Social Work is seeking post-discharge placement for this patient at the Skilled  Nursing Facility level of care (*CSW will initial, date and re-position this form in  chart as items are completed):  Yes   Patient/family provided with [facility] Clinical Social Work Department's list of facilities offering this level of care within the geographic area requested by the patient (or if unable, by the patient's family).  Yes   Patient/family informed of their freedom to choose among providers that offer the needed level of care, that participate in Medicare, Medicaid or managed care program needed by the patient, have an available bed and are willing to accept the patient.  Yes   Patient/family informed of [facility] ownership interest in [facility] and [facility], as well as of the fact that they are under no obligation to receive care at these facilities.  PASRR submitted to EDS on 05/17/16     PASRR number received on 05/17/16     Existing PASRR number confirmed on       FL2 transmitted to all facilities in geographic area requested by pt/family on 05/17/16     FL2 transmitted to all facilities within larger geographic area on       Patient informed that his/her managed care company has contracts with or will negotiate with certain facilities, including the following:        Yes   Patient/family informed of bed offers received.  Patient chooses bed at [facility]     Physician recommends and patient chooses bed at      [facility] on 05/18/16.  [facility]     Patient family notified on 05/18/16 of transfer.  Name of family member notified:  Patient notified her husband     PHYSICIAN Please sign FL2     Additional  Comment:    _______________________________________________ Darleene CleaverAnterhaus, Mancil Pfenning R, LCSWA 05/18/2016, 4:31 PM

## 2016-05-18 NOTE — Progress Notes (Signed)
Patient Name: Laura Kline Date of Encounter: 05/18/2016  Primary Cardiologist: [facility] Problem List     Active Problems:   Sepsis [facility])   Morbid obesity (HCC)   Persistent atrial fibrillation (HCC)     Subjective   Feels fine this morning Denies any chest pain, shortness of breath, palpitations Telemetry reviewed with heart rate 90-100 Yesterday with getting out of bed to bedside commode heart rate up to 120, was asymptomatic Very deconditioned, required 2 person assistance to get out of bed She is open to rehabilitation facility  Inpatient Medications    Scheduled Meds: . apixaban  5 mg Oral BID  . digoxin  0.25 mg Oral Daily  . diltiazem  180 mg Oral Daily  . furosemide  40 mg Oral Daily  . insulin aspart  0-5 Units Subcutaneous QHS  . insulin aspart  0-9 Units Subcutaneous TID WC  . insulin glargine  58 Units Subcutaneous QHS  . metoprolol  50 mg Oral BID  . rosuvastatin  10 mg Oral QPC supper  . sodium chloride flush  10-40 mL Intracatheter Q12H  . sulfamethoxazole-trimethoprim  1 tablet Oral Q12H   Continuous Infusions:  PRN Meds: acetaminophen **OR** acetaminophen, HYDROcodone-acetaminophen, ondansetron **OR** ondansetron (ZOFRAN) IV, sodium chloride flush   Vital Signs    Vitals:   05/17/16 0436 05/17/16 0749 05/17/16 1147 05/17/16 2009  BP: 126/69 135/63 116/72 (!) 125/55  Pulse: 80 100 (!) 123 93  Resp:  16 16 17   Temp:  98.2 F (36.8 C) 99 F (37.2 C) 99.2 F (37.3 C)  TempSrc:  Oral Oral Oral  SpO2:  100% 90% 98%  Weight:      Height:        Intake/Output Summary (Last 24 hours) at 05/18/16 1312 Last data filed at 05/18/16 0900  Gross per 24 hour  Intake              600 ml  Output              300 ml  Net              300 ml   Filed Weights   05/12/16 1130 05/12/16 1529 05/14/16 0340  Weight: (!) 305 lb (138.3 kg) (!) 306 lb (138.8 kg) (!) 316 lb 4.8 oz (143.5 kg)    Physical Exam    GEN: Well nourished, well  developed, in no acute distress.  HEENT: Grossly normal.  Neck: Supple, no JVD, carotid bruits, or masses. Cardiac: Tachycardic, irregularly irregular, no murmurs, rubs, or gallops. No clubbing, cyanosis, LLE dressed and wrapped. 2+ pretibial edema bilaterally.  Radials/DP/PT 2+ and equal bilaterally.  Respiratory:  Respirations regular and unlabored, clear to auscultation bilaterally. GI: Obese, soft, nontender, nondistended, BS + x 4. MS: no deformity or atrophy. Skin: warm and dry, no rash. Neuro:  Strength and sensation are intact. Psych: AAOx3.  Normal affect.  Labs    CBC  Recent Labs  05/18/16 0019 05/18/16 0436  WBC 10.1 10.5  NEUTROABS 8.3* 8.6*  HGB 10.4* 10.4*  HCT 29.7* 29.8*  MCV 93.9 94.2  PLT 327 335   Basic Metabolic Panel  Recent Labs  05/16/16 0010 05/17/16 1217 05/18/16 0019  NA 137 135 133*  K 4.3 3.9 3.9  CL 107 102 99*  CO2 24 26 28   GLUCOSE 227* 232* 146*  BUN 23* 19 20  CREATININE 0.98 0.98 1.10*  CALCIUM 7.6* 8.5* 8.5*  MG 1.8  --   --  Liver Function Tests No results for input(s): AST, ALT, ALKPHOS, BILITOT, PROT, ALBUMIN in the last 72 hours. No results for input(s): LIPASE, AMYLASE in the last 72 hours. Cardiac Enzymes No results for input(s): CKTOTAL, CKMB, CKMBINDEX, TROPONINI in the last 72 hours. BNP Invalid input(s): POCBNP D-Dimer No results for input(s): DDIMER in the last 72 hours. Hemoglobin A1C No results for input(s): HGBA1C in the last 72 hours. Fasting Lipid Panel No results for input(s): CHOL, HDL, LDLCALC, TRIG, CHOLHDL, LDLDIRECT in the last 72 hours. Thyroid Function Tests No results for input(s): TSH, T4TOTAL, T3FREE, THYROIDAB in the last 72 hours.  Invalid input(s): FREET3  Telemetry    Afib with RVR, heart rates 120s to 140s bpm - Personally Reviewed  ECG    n/a - Personally Reviewed  Radiology    No results found.  Cardiac Studies   TTE (05/14/16): Study Conclusions  - Left ventricle: The  cavity size was normal. There was mild concentric hypertrophy. Systolic function was normal. The estimated ejection fraction was in the range of 55% to 60%. Wall motion was normal; there were no regional wall motion abnormalities. - Aortic valve: There was mild stenosis. Mean gradient (S): 10 mm Hg. Valve area (Vmax): 1.64 cm^2. - Left atrium: The atrium was mildly dilated. - Right atrium: The atrium was mildly dilated. - Pulmonary arteries: Systolic pressure was mildly increased. PA peak pressure: 45 mm Hg (S).  Patient Profile     61 y.o. female with history of DM, coronary calcification (noted on CT 2016), obesity, HTN, hyperlipidemia [facility] LLE cellul[facility] and was found to be in new onset Afib with RVR.  Assessment & Plan    1. New onset Afib with RVR: -Remains in Afib  Would continue Digoxin 0.25 mg daily, follow up digoxin level and bmet  Diltiazem 180 mg daily -Continue metoprolol to 50 mg bid -Eliquis 5 mg bid  2. Coronary atherosclerosis: -No chest pain -Recommend risk factor modification: Diabetes control, hypertension,hyperlipidemia, obesity -Eliquis 5 BID -Crestor    3. Shortness of breath: -Continue furosemide 40 mg PO daily -Rate control for Afib as above Also tremendously deconditioned with anemia   4) Morbid obesity Needs placement in rehabilitation  Needs to meet with nutritionist as an outpatient   Discussed case with hospitalist service, case management Greater than 50% was spent in counseling and coordination of care with patient Total encounter time 25 minutes or more   Signed: Dossie Arbourim Jakhai Fant  M.D., Ph.D. [facility]

## 2016-05-18 NOTE — Telephone Encounter (Signed)
TCM.... Pt is being discharged today was seen in hospital by Dr Mariah MillingGollan  She has seen Dr Kirke CorinArida in past She is coming on 05/30/16 to see Ward Givenshris Berge

## 2016-05-18 NOTE — Progress Notes (Addendum)
Called report to facility nurse at this time. Will also remove PICC line, as confirmed w/ Dr. Amado CoeGouru via phone. Raynald BlendSteven M Yvon Mccord   Called EMS for non-emergent patient transport at this time as well. Jari FavreSteven M [facility]

## 2016-05-18 NOTE — Telephone Encounter (Signed)
[facility].  Left voicemail message with appointment information and instructions to call back if she has any questions regarding her discharge instructions or medications.

## 2016-05-20 LAB — AEROBIC/ANAEROBIC CULTURE (SURGICAL/DEEP WOUND)

## 2016-05-20 LAB — AEROBIC/ANAEROBIC CULTURE W GRAM STAIN (SURGICAL/DEEP WOUND)

## 2016-05-25 ENCOUNTER — Inpatient Hospital Stay
Admission: EM | Admit: 2016-05-25 | Discharge: 2016-05-28 | DRG: 683 | Disposition: A | Discharge status: Skilled Nursing Facility | Payer: BLUE CROSS/BLUE SHIELD | Attending: Internal Medicine | Admitting: Internal Medicine

## 2016-05-25 DIAGNOSIS — N950 Postmenopausal bleeding: Secondary | ICD-10-CM | POA: Diagnosis present

## 2016-05-25 DIAGNOSIS — Z794 Long term (current) use of insulin: Secondary | ICD-10-CM

## 2016-05-25 DIAGNOSIS — Z886 Allergy status to analgesic agent status: Secondary | ICD-10-CM

## 2016-05-25 DIAGNOSIS — R509 Fever, unspecified: Secondary | ICD-10-CM | POA: Diagnosis not present

## 2016-05-25 DIAGNOSIS — Z6841 Body Mass Index (BMI) 40.0 and over, adult: Secondary | ICD-10-CM

## 2016-05-25 DIAGNOSIS — E785 Hyperlipidemia, unspecified: Secondary | ICD-10-CM | POA: Diagnosis present

## 2016-05-25 DIAGNOSIS — E860 Dehydration: Secondary | ICD-10-CM | POA: Diagnosis present

## 2016-05-25 DIAGNOSIS — Z7901 Long term (current) use of anticoagulants: Secondary | ICD-10-CM

## 2016-05-25 DIAGNOSIS — E1151 Type 2 diabetes mellitus with diabetic peripheral angiopathy without gangrene: Secondary | ICD-10-CM | POA: Diagnosis present

## 2016-05-25 DIAGNOSIS — R197 Diarrhea, unspecified: Secondary | ICD-10-CM | POA: Diagnosis present

## 2016-05-25 DIAGNOSIS — K8020 Calculus of gallbladder without cholecystitis without obstruction: Secondary | ICD-10-CM | POA: Diagnosis present

## 2016-05-25 DIAGNOSIS — I2510 Atherosclerotic heart disease of native coronary artery without angina pectoris: Secondary | ICD-10-CM | POA: Diagnosis present

## 2016-05-25 DIAGNOSIS — R7989 Other specified abnormal findings of blood chemistry: Secondary | ICD-10-CM | POA: Diagnosis present

## 2016-05-25 DIAGNOSIS — I4891 Unspecified atrial fibrillation: Secondary | ICD-10-CM | POA: Diagnosis present

## 2016-05-25 DIAGNOSIS — E1165 Type 2 diabetes mellitus with hyperglycemia: Secondary | ICD-10-CM | POA: Diagnosis present

## 2016-05-25 DIAGNOSIS — N179 Acute kidney failure, unspecified: Principal | ICD-10-CM | POA: Diagnosis present

## 2016-05-25 DIAGNOSIS — Z880 Allergy status to penicillin: Secondary | ICD-10-CM

## 2016-05-25 DIAGNOSIS — R971 Elevated cancer antigen 125 [CA 125]: Secondary | ICD-10-CM | POA: Diagnosis present

## 2016-05-25 DIAGNOSIS — R938 Abnormal findings on diagnostic imaging of other specified body structures: Secondary | ICD-10-CM | POA: Diagnosis present

## 2016-05-25 DIAGNOSIS — R1011 Right upper quadrant pain: Secondary | ICD-10-CM

## 2016-05-25 DIAGNOSIS — I10 Essential (primary) hypertension: Secondary | ICD-10-CM | POA: Diagnosis present

## 2016-05-25 DIAGNOSIS — R740 Nonspecific elevation of levels of transaminase and lactic acid dehydrogenase [LDH]: Secondary | ICD-10-CM | POA: Diagnosis present

## 2016-05-25 DIAGNOSIS — Z79899 Other long term (current) drug therapy: Secondary | ICD-10-CM

## 2016-05-25 DIAGNOSIS — L03116 Cellulitis of left lower limb: Secondary | ICD-10-CM | POA: Diagnosis present

## 2016-05-25 DIAGNOSIS — Z8249 Family history of ischemic heart disease and other diseases of the circulatory system: Secondary | ICD-10-CM

## 2016-05-25 LAB — URINALYSIS, COMPLETE (UACMP) WITH MICROSCOPIC
BILIRUBIN URINE: NEGATIVE
Glucose, UA: 50 mg/dL — AB
Hgb urine dipstick: NEGATIVE
KETONES UR: NEGATIVE mg/dL
LEUKOCYTES UA: NEGATIVE
NITRITE: NEGATIVE
Protein, ur: NEGATIVE mg/dL
SPECIFIC GRAVITY, URINE: 1.017 (ref 1.005–1.030)
pH: 5 (ref 5.0–8.0)

## 2016-05-25 LAB — CBC WITH DIFFERENTIAL/PLATELET
BASOS PCT: 0 %
Basophils Absolute: 0 10*3/uL (ref 0–0.1)
Eosinophils Absolute: 0.2 10*3/uL (ref 0–0.7)
Eosinophils Relative: 5 %
HEMATOCRIT: 35.1 % (ref 35.0–47.0)
HEMOGLOBIN: 12 g/dL (ref 12.0–16.0)
LYMPHS ABS: 0.9 10*3/uL — AB (ref 1.0–3.6)
LYMPHS PCT: 25 %
MCH: 31.7 pg (ref 26.0–34.0)
MCHC: 34.2 g/dL (ref 32.0–36.0)
MCV: 92.8 fL (ref 80.0–100.0)
MONOS PCT: 11 %
Monocytes Absolute: 0.4 10*3/uL (ref 0.2–0.9)
NEUTROS ABS: 2.1 10*3/uL (ref 1.4–6.5)
NEUTROS PCT: 59 %
PLATELETS: 101 10*3/uL — AB (ref 150–440)
RBC: 3.78 MIL/uL — ABNORMAL LOW (ref 3.80–5.20)
RDW: 13.1 % (ref 11.5–14.5)
WBC: 3.6 10*3/uL (ref 3.6–11.0)

## 2016-05-25 LAB — COMPREHENSIVE METABOLIC PANEL
ALBUMIN: 2.2 g/dL — AB (ref 3.5–5.0)
ALT: 208 U/L — ABNORMAL HIGH (ref 14–54)
ANION GAP: 14 (ref 5–15)
AST: 140 U/L — AB (ref 15–41)
Alkaline Phosphatase: 139 U/L — ABNORMAL HIGH (ref 38–126)
BUN: 114 mg/dL — ABNORMAL HIGH (ref 6–20)
CO2: 22 mmol/L (ref 22–32)
Calcium: 7.4 mg/dL — ABNORMAL LOW (ref 8.9–10.3)
Chloride: 97 mmol/L — ABNORMAL LOW (ref 101–111)
Creatinine, Ser: 2.67 mg/dL — ABNORMAL HIGH (ref 0.44–1.00)
GFR calc Af Amer: 21 mL/min — ABNORMAL LOW (ref >60)
GFR calc non Af Amer: 18 mL/min — ABNORMAL LOW (ref >60)
GLUCOSE: 305 mg/dL — AB (ref 65–99)
POTASSIUM: 3.5 mmol/L (ref 3.5–5.1)
SODIUM: 133 mmol/L — AB (ref 135–145)
Total Bilirubin: 1.5 mg/dL — ABNORMAL HIGH (ref 0.3–1.2)
Total Protein: 5.7 g/dL — ABNORMAL LOW (ref 6.5–8.1)

## 2016-05-25 LAB — LACTIC ACID, PLASMA: Lactic Acid, Venous: 1.6 mmol/L (ref 0.5–1.9)

## 2016-05-25 MED ORDER — SODIUM CHLORIDE 0.9 % IV BOLUS (SEPSIS)
1000.0000 mL | Freq: Once | INTRAVENOUS | Status: AC
  Administered 2016-05-26: 1000 mL via INTRAVENOUS

## 2016-05-25 NOTE — ED Triage Notes (Signed)
Pt from [facility] with fever and vaginal bleeding. Pt is flushed, recently discharged from hospital after sepsis diagnosis. Pt with wound noted to left foot. Pt denies pain. Ems states staff [facility] noticied vaginal bleeding last pm.

## 2016-05-25 NOTE — ED Notes (Signed)
Loistine SimasJohanna, ed tech in to assist md with pelvic exam.

## 2016-05-25 NOTE — ED Notes (Signed)
Pt cleansed of diarrhea.

## 2016-05-25 NOTE — ED Notes (Addendum)
Pt with redness and flushing noted to face, bilateral upper extremities and lower extremities. Rash appears like "redman" syndrome from vancomycin or antibiotic rash. Pt finished levaquin yesterday per MAR from [facility]. Pt denies shob, itching. Pt with wound noted to left anterior upper foot between great toe and 2nd toe. Pt with wound noted to  Base of top of 2nd toe with sutures in place and base of bottom of second toe. No drainage noted. Pt with xeroform dressing in place to wounds.

## 2016-05-26 ENCOUNTER — Emergency Department: Payer: BLUE CROSS/BLUE SHIELD

## 2016-05-26 DIAGNOSIS — K8020 Calculus of gallbladder without cholecystitis without obstruction: Secondary | ICD-10-CM | POA: Diagnosis present

## 2016-05-26 DIAGNOSIS — E860 Dehydration: Secondary | ICD-10-CM | POA: Diagnosis present

## 2016-05-26 DIAGNOSIS — Z6841 Body Mass Index (BMI) 40.0 and over, adult: Secondary | ICD-10-CM | POA: Diagnosis not present

## 2016-05-26 DIAGNOSIS — I10 Essential (primary) hypertension: Secondary | ICD-10-CM | POA: Diagnosis present

## 2016-05-26 DIAGNOSIS — R197 Diarrhea, unspecified: Secondary | ICD-10-CM | POA: Diagnosis present

## 2016-05-26 DIAGNOSIS — R1011 Right upper quadrant pain: Secondary | ICD-10-CM

## 2016-05-26 DIAGNOSIS — R7989 Other specified abnormal findings of blood chemistry: Secondary | ICD-10-CM | POA: Diagnosis present

## 2016-05-26 DIAGNOSIS — Z79899 Other long term (current) drug therapy: Secondary | ICD-10-CM | POA: Diagnosis not present

## 2016-05-26 DIAGNOSIS — R509 Fever, unspecified: Secondary | ICD-10-CM | POA: Diagnosis present

## 2016-05-26 DIAGNOSIS — Z7901 Long term (current) use of anticoagulants: Secondary | ICD-10-CM | POA: Diagnosis not present

## 2016-05-26 DIAGNOSIS — I4891 Unspecified atrial fibrillation: Secondary | ICD-10-CM | POA: Diagnosis present

## 2016-05-26 DIAGNOSIS — R938 Abnormal findings on diagnostic imaging of other specified body structures: Secondary | ICD-10-CM | POA: Diagnosis present

## 2016-05-26 DIAGNOSIS — L03116 Cellulitis of left lower limb: Secondary | ICD-10-CM | POA: Diagnosis present

## 2016-05-26 DIAGNOSIS — Z794 Long term (current) use of insulin: Secondary | ICD-10-CM | POA: Diagnosis not present

## 2016-05-26 DIAGNOSIS — R740 Nonspecific elevation of levels of transaminase and lactic acid dehydrogenase [LDH]: Secondary | ICD-10-CM | POA: Diagnosis present

## 2016-05-26 DIAGNOSIS — R971 Elevated cancer antigen 125 [CA 125]: Secondary | ICD-10-CM | POA: Diagnosis present

## 2016-05-26 DIAGNOSIS — N179 Acute kidney failure, unspecified: Secondary | ICD-10-CM

## 2016-05-26 DIAGNOSIS — N950 Postmenopausal bleeding: Secondary | ICD-10-CM

## 2016-05-26 DIAGNOSIS — E785 Hyperlipidemia, unspecified: Secondary | ICD-10-CM | POA: Diagnosis present

## 2016-05-26 DIAGNOSIS — Z886 Allergy status to analgesic agent status: Secondary | ICD-10-CM | POA: Diagnosis not present

## 2016-05-26 DIAGNOSIS — I2510 Atherosclerotic heart disease of native coronary artery without angina pectoris: Secondary | ICD-10-CM | POA: Diagnosis present

## 2016-05-26 DIAGNOSIS — Z8249 Family history of ischemic heart disease and other diseases of the circulatory system: Secondary | ICD-10-CM | POA: Diagnosis not present

## 2016-05-26 DIAGNOSIS — E1165 Type 2 diabetes mellitus with hyperglycemia: Secondary | ICD-10-CM | POA: Diagnosis not present

## 2016-05-26 DIAGNOSIS — E1151 Type 2 diabetes mellitus with diabetic peripheral angiopathy without gangrene: Secondary | ICD-10-CM | POA: Diagnosis present

## 2016-05-26 DIAGNOSIS — Z880 Allergy status to penicillin: Secondary | ICD-10-CM | POA: Diagnosis not present

## 2016-05-26 LAB — GASTROINTESTINAL PANEL BY PCR, STOOL (REPLACES STOOL CULTURE)

## 2016-05-26 LAB — PROTIME-INR
INR: 1.52
Prothrombin Time: 18.5 seconds — ABNORMAL HIGH (ref 11.4–15.2)

## 2016-05-26 LAB — C DIFFICILE QUICK SCREEN W PCR REFLEX
C DIFFICILE (CDIFF) INTERP: NOT DETECTED
C DIFFICILE (CDIFF) TOXIN: NEGATIVE
C DIFFICLE (CDIFF) ANTIGEN: NEGATIVE

## 2016-05-26 LAB — GLUCOSE, CAPILLARY
Glucose-Capillary: 201 mg/dL — ABNORMAL HIGH (ref 65–99)
Glucose-Capillary: 211 mg/dL — ABNORMAL HIGH (ref 65–99)
Glucose-Capillary: 179 mg/dL — ABNORMAL HIGH (ref 65–99)
Glucose-Capillary: 219 mg/dL — ABNORMAL HIGH (ref 65–99)

## 2016-05-26 LAB — APTT
APTT: 56 s — AB (ref 24–36)
aPTT: 35 seconds (ref 24–36)

## 2016-05-26 LAB — MRSA PCR SCREENING: MRSA by PCR: NEGATIVE

## 2016-05-26 LAB — HEPARIN LEVEL (UNFRACTIONATED): Heparin Unfractionated: 2.46 IU/mL — ABNORMAL HIGH (ref 0.30–0.70)

## 2016-05-26 MED ORDER — FUROSEMIDE 40 MG PO TABS
40.0000 mg | ORAL_TABLET | Freq: Every day | ORAL | Status: DC
  Administered 2016-05-26 – 2016-05-28 (×3): 40 mg via ORAL
  Filled 2016-05-26 (×3): qty 1

## 2016-05-26 MED ORDER — ONDANSETRON HCL 4 MG PO TABS
4.0000 mg | ORAL_TABLET | Freq: Four times a day (QID) | ORAL | Status: DC | PRN
  Administered 2016-05-28: 4 mg via ORAL
  Filled 2016-05-26: qty 1

## 2016-05-26 MED ORDER — ONDANSETRON HCL 4 MG/2ML IJ SOLN: 4.0000 mg | Freq: Four times a day (QID) | INTRAMUSCULAR | Status: DC | PRN

## 2016-05-26 MED ORDER — LOPERAMIDE HCL 2 MG PO CAPS
2.0000 mg | ORAL_CAPSULE | Freq: Three times a day (TID) | ORAL | Status: DC | PRN
  Administered 2016-05-27 (×2): 2 mg via ORAL
  Filled 2016-05-26 (×2): qty 1

## 2016-05-26 MED ORDER — DILTIAZEM HCL ER COATED BEADS 180 MG PO CP24
180.0000 mg | ORAL_CAPSULE | Freq: Every day | ORAL | Status: DC
  Administered 2016-05-27 – 2016-05-28 (×2): 180 mg via ORAL
  Filled 2016-05-26 (×3): qty 1

## 2016-05-26 MED ORDER — SODIUM CHLORIDE 0.9 % IV SOLN
INTRAVENOUS | Status: DC
  Administered 2016-05-26 – 2016-05-27 (×4): via INTRAVENOUS

## 2016-05-26 MED ORDER — ROSUVASTATIN CALCIUM 10 MG PO TABS
10.0000 mg | ORAL_TABLET | Freq: Every day | ORAL | Status: DC
  Administered 2016-05-26 – 2016-05-28 (×2): 10 mg via ORAL
  Filled 2016-05-26 (×2): qty 1

## 2016-05-26 MED ORDER — HEPARIN BOLUS VIA INFUSION
5000.0000 [IU] | Freq: Once | INTRAVENOUS | Status: DC
  Filled 2016-05-26: qty 5000

## 2016-05-26 MED ORDER — DIGOXIN 250 MCG PO TABS
0.2500 mg | ORAL_TABLET | Freq: Every day | ORAL | Status: DC
Start: 2016-05-26 — End: 2016-05-28
  Administered 2016-05-26 – 2016-05-28 (×3): 0.25 mg via ORAL
  Filled 2016-05-26 (×3): qty 1

## 2016-05-26 MED ORDER — INSULIN GLARGINE 100 UNIT/ML ~~LOC~~ SOLN
36.0000 [IU] | Freq: Every day | SUBCUTANEOUS | Status: DC
  Administered 2016-05-26: 22:00:00 36 [IU] via SUBCUTANEOUS
  Filled 2016-05-26: qty 0.36

## 2016-05-26 MED ORDER — SODIUM CHLORIDE 0.9 % IV BOLUS (SEPSIS)
1000.0000 mL | Freq: Once | INTRAVENOUS | Status: AC
  Administered 2016-05-26: 1000 mL via INTRAVENOUS

## 2016-05-26 MED ORDER — INSULIN ASPART 100 UNIT/ML ~~LOC~~ SOLN
0.0000 [IU] | Freq: Three times a day (TID) | SUBCUTANEOUS | Status: DC
  Administered 2016-05-26: 5 [IU] via SUBCUTANEOUS
  Administered 2016-05-26: 3 [IU] via SUBCUTANEOUS
  Administered 2016-05-26: 08:00:00 5 [IU] via SUBCUTANEOUS
  Administered 2016-05-27: 3 [IU] via SUBCUTANEOUS
  Administered 2016-05-27: 5 [IU] via SUBCUTANEOUS
  Administered 2016-05-28 (×3): 3 [IU] via SUBCUTANEOUS
  Filled 2016-05-26 (×3): qty 5
  Filled 2016-05-26 (×4): qty 3
  Filled 2016-05-26: qty 5

## 2016-05-26 MED ORDER — HYDROCODONE-ACETAMINOPHEN 7.5-325 MG PO TABS: 1.0000 | ORAL_TABLET | Freq: Four times a day (QID) | ORAL | Status: DC | PRN

## 2016-05-26 MED ORDER — ACETAMINOPHEN 650 MG RE SUPP: 650.0000 mg | Freq: Four times a day (QID) | RECTAL | Status: DC | PRN

## 2016-05-26 MED ORDER — METOPROLOL TARTRATE 50 MG PO TABS
50.0000 mg | ORAL_TABLET | Freq: Two times a day (BID) | ORAL | Status: DC
  Administered 2016-05-27 – 2016-05-28 (×3): 50 mg via ORAL
  Filled 2016-05-26 (×5): qty 1

## 2016-05-26 MED ORDER — SENNOSIDES-DOCUSATE SODIUM 8.6-50 MG PO TABS
1.0000 | ORAL_TABLET | Freq: Two times a day (BID) | ORAL | Status: DC
  Administered 2016-05-26 – 2016-05-28 (×2): 1 via ORAL
  Filled 2016-05-26 (×3): qty 1

## 2016-05-26 MED ORDER — LEVOFLOXACIN 500 MG PO TABS
500.0000 mg | ORAL_TABLET | Freq: Once | ORAL | Status: AC
  Administered 2016-05-26: 09:00:00 500 mg via ORAL
  Filled 2016-05-26: qty 1

## 2016-05-26 MED ORDER — CLINDAMYCIN HCL 150 MG PO CAPS
300.0000 mg | ORAL_CAPSULE | Freq: Four times a day (QID) | ORAL | Status: DC
  Administered 2016-05-26 – 2016-05-28 (×10): 300 mg via ORAL
  Filled 2016-05-26 (×3): qty 1
  Filled 2016-05-26: qty 2
  Filled 2016-05-26: qty 1
  Filled 2016-05-26 (×7): qty 2

## 2016-05-26 MED ORDER — OXYCODONE-ACETAMINOPHEN 5-325 MG PO TABS
ORAL_TABLET | ORAL | Status: AC
  Filled 2016-05-26: qty 1

## 2016-05-26 MED ORDER — LEVOFLOXACIN 500 MG PO TABS: 500.0000 mg | ORAL_TABLET | Freq: Every day | ORAL | Status: DC

## 2016-05-26 MED ORDER — HEPARIN (PORCINE) IN NACL 100-0.45 UNIT/ML-% IJ SOLN
1300.0000 [IU]/h | INTRAMUSCULAR | Status: DC
  Administered 2016-05-26: 08:00:00 1300 [IU]/h via INTRAVENOUS
  Filled 2016-05-26 (×3): qty 250

## 2016-05-26 MED ORDER — INSULIN ASPART 100 UNIT/ML ~~LOC~~ SOLN
0.0000 [IU] | Freq: Once | SUBCUTANEOUS | Status: DC
Start: 2016-05-26 — End: 2016-05-28
  Filled 2016-05-26: qty 2

## 2016-05-26 MED ORDER — OXYCODONE-ACETAMINOPHEN 5-325 MG PO TABS
1.0000 | ORAL_TABLET | Freq: Once | ORAL | Status: AC
  Administered 2016-05-26: 1 via ORAL

## 2016-05-26 MED ORDER — NYSTATIN 100000 UNIT/GM EX POWD
1.0000 g | Freq: Three times a day (TID) | CUTANEOUS | Status: DC
  Administered 2016-05-26 – 2016-05-28 (×7): 1 g via TOPICAL
  Filled 2016-05-26 (×2): qty 15

## 2016-05-26 MED ORDER — LEVOFLOXACIN 500 MG PO TABS
250.0000 mg | ORAL_TABLET | Freq: Every day | ORAL | Status: DC
  Filled 2016-05-26: qty 1

## 2016-05-26 MED ORDER — APIXABAN 5 MG PO TABS: 5.0000 mg | ORAL_TABLET | Freq: Two times a day (BID) | ORAL | Status: DC

## 2016-05-26 MED ORDER — ACETAMINOPHEN 325 MG PO TABS
650.0000 mg | ORAL_TABLET | Freq: Four times a day (QID) | ORAL | Status: DC | PRN
  Filled 2016-05-26: qty 2

## 2016-05-26 MED ORDER — NORETHINDRONE ACETATE 5 MG PO TABS
5.0000 mg | ORAL_TABLET | Freq: Three times a day (TID) | ORAL | Status: DC
  Administered 2016-05-26 – 2016-05-28 (×7): 5 mg via ORAL
  Filled 2016-05-26 (×9): qty 1

## 2016-05-26 NOTE — ED Notes (Signed)
Pt and family updated on plan of care. Pt and family verbalize understanding.

## 2016-05-26 NOTE — Progress Notes (Signed)
ANTICOAGULATION CONSULT NOTE - Initial Consult  Pharmacy Consult for heparin drip Indication: atrial fibrillation, on eliquis as outpatient, converting to parenteral therapy in anticipation of possible procedure  Allergies  Allergen Reactions  . Rofecoxib Other (See Comments)    Reaction: unknown  . Penicillins Rash and Other (See Comments)    Has patient had a PCN reaction causing immediate rash, facial/tongue/throat swelling, SOB or lightheadedness with hypotension: Unknown Has patient had a PCN reaction causing severe rash involving mucus membranes or skin necrosis: Unknown Has patient had a PCN reaction that required hospitalization: Unknown Has patient had a PCN reaction occurring within the last 10 years: Unknown If all of the above answers are "NO", then may proceed with Cephalosporin use.     Patient Measurements: Height: 5\' 3"  (160 cm) Weight: 300 lb (136.1 kg) IBW/kg (Calculated) : 52.4 Heparin Dosing Weight: 87 kg  Vital Signs: Temp: 97.7 F (36.5 C) (05/18 2246) Temp Source: Oral (05/18 2246) BP: 96/44 (05/19 0500) Pulse Rate: 67 (05/19 0500)  Labs:  Recent Labs  05/25/16 2252  HGB 12.0  HCT 35.1  PLT 101*  CREATININE 2.67*    Estimated Creatinine Clearance: 29.6 mL/min (A) (by C-G formula based on SCr of 2.67 mg/dL (H)).   Medical History: Past Medical History:  Diagnosis Date  . Coronary atherosclerosis    LAD calcifications noted on CT scan in 2016 with negative nuclear stress test.  . DKA (diabetic ketoacidoses) (HCC)    a. 05/2009  . Essential hypertension   . History of Bell's palsy   . Hyperlipidemia   . Hypertension   . Hypokalemia   . IDDM (insulin dependent diabetes mellitus) (HCC)   . Long-term insulin use (HCC)   . Morbid obesity (HCC)   . Peripheral vascular disease (HCC)     Medications:  Eliquis as outpatient  Assessment: Baseline labs ordered   Goal of Therapy:  Heparin level 0.3-0.7 units/ml  APTT 66-102 Monitor  platelets by anticoagulation protocol: Yes   Plan:  5000 unit bolus and initial rate ot 1300 units/hr. Will follow and adjust by APTT until correlation with heparin levels. First aPTT 6 hours after start of infusion.  Laura Kline S 05/26/2016,6:36 AM

## 2016-05-26 NOTE — ED Provider Notes (Signed)
[facility] Emergency Department Provider Note   First MD Initiated Contact with Patient 05/25/16 2251     (approximate)  I have reviewed the triage vital signs and the nursing notes.   HISTORY  Chief Complaint Fever and Vaginal Bleeding    HPI Laura Kline is a 61 y.o. female with low list of chronic medical conditions including recent hospital admission for sepsis turns to the emergency department from [facility] health care with "fever" and vaginal bleeding. Patient stated vaginal bleeding starting today and is painless. Patient noted to be afebrile on presentation unsure if any antipyretic was given by [facility] healthcare patient does not recall receiving any antipyretic. Patient is postmenopausal stating that her last menses was "many years ago".   Past Medical History:  Diagnosis Date  . Coronary atherosclerosis    LAD calcifications noted on CT scan in 2016 with negative nuclear stress test.  . DKA (diabetic ketoacidoses) (HCC)    a. 05/2009  . Essential hypertension   . History of Bell's palsy   . Hyperlipidemia   . Hypertension   . Hypokalemia   . IDDM (insulin dependent diabetes mellitus) (HCC)   . Long-term insulin use (HCC)   . Morbid obesity (HCC)   . Peripheral vascular disease [facility])     Patient Active Problem List   Diagnosis Date Noted  . AKI (acute kidney injury) (HCC) 05/26/2016  . Atrial fibrillation with rapid ventricular response (HCC)   . Cellulitis of left lower extremity   . Morbid obesity (HCC) 05/17/2016  . Persistent atrial fibrillation (HCC) 05/17/2016  . Sepsis (HCC) 05/12/2016  . Hyperlipidemia   . Essential hypertension   . Coronary atherosclerosis   . Chest pain 07/26/2014    Past Surgical History:  Procedure Laterality Date  . CATARACT EXTRACTION W/PHACO Left 02/08/2015   Procedure: CATARACT EXTRACTION PHACO AND INTRAOCULAR LENS PLACEMENT (IOC);  Surgeon: Galen Manila, MD;  Location: [facility] ORS;  Service:  Ophthalmology;  Laterality: Left;  [place] 01:33 AP% 15.9 CDE 14.94 flyuid pack lot # [id number]  . CESAREAN SECTION     x2  . EYE SURGERY Right    Cataract Extraction with IOL  . FOREIGN BODY REMOVAL Left 05/13/2016   Procedure: REMOVAL FOREIGN BODY EXTREMITY;  Surgeon: Recardo Evangelist, DPM;  Location: [facility] ORS;  Service: Podiatry;  Laterality: Left;  . INCISION AND DRAINAGE Left 05/13/2016   Procedure: INCISION AND DRAINAGE;  Surgeon: Recardo Evangelist, DPM;  Location: [facility] ORS;  Service: Podiatry;  Laterality: Left;    Prior to Admission medications   Medication Sig Start Date End Date Taking? Authorizing Provider  apixaban (ELIQUIS) 5 MG TABS tablet Take 1 tablet (5 mg total) by mouth 2 (two) times daily. 05/18/16  Yes Gouru, Deanna Artis, MD  clindamycin (CLEOCIN) 300 MG capsule Take 300 mg by mouth 4 (four) times daily. 05/22/16 06/04/16 Yes [provider]  digoxin (LANOXIN) 0.25 MG tablet Take 1 tablet (0.25 mg total) by mouth daily. 05/19/16  Yes Gouru, Deanna Artis, MD  diltiazem (CARDIZEM CD) 180 MG 24 hr capsule Take 1 capsule (180 mg total) by mouth daily. 05/19/16  Yes Gouru, Deanna Artis, MD  furosemide (LASIX) 40 MG tablet Take 1 tablet (40 mg total) by mouth daily. 05/19/16  Yes Gouru, Deanna Artis, MD  HYDROcodone-acetaminophen (NORCO) 7.5-325 MG tablet Take 1 tablet by mouth every 6 (six) hours as needed for moderate pain. 05/18/16  Yes Gouru, Deanna Artis, MD  Insulin Glargine (LANTUS SOLOSTAR) 100 UNIT/ML Solostar Pen Inject 58 Units into the skin  at bedtime.    Yes [provider]  levofloxacin (LEVAQUIN) 500 MG tablet Take 500 mg by mouth daily. 05/22/16 05/28/16 Yes [provider]  loperamide (IMODIUM A-D) 2 MG tablet Take 2 mg by mouth every 8 (eight) hours as needed for diarrhea or loose stools.   Yes [provider]  metFORMIN (GLUCOPHAGE-XR) 500 MG 24 hr tablet Take 2 tablets by mouth daily with supper.    Yes [provider]  metoprolol (LOPRESSOR) 50 MG tablet Take 1  tablet (50 mg total) by mouth 2 (two) times daily. 10/07/14  Yes [place] OuchArida, Muhammad A, MD  nystatin (MYCOSTATIN/NYSTOP) powder Apply 1 g topically as needed (for rash and redness).   Yes [provider]  nystatin (MYCOSTATIN/NYSTOP) powder Apply 1 g topically 2 (two) times daily. 05/22/16 05/31/16 Yes [provider]  ondansetron (ZOFRAN) 4 MG tablet Take 1 tablet (4 mg total) by mouth every 6 (six) hours as needed for nausea. 05/18/16  Yes Gouru, Deanna ArtisAruna, MD  rosuvastatin (CRESTOR) 10 MG tablet Take 1 tablet (10 mg total) by mouth daily after supper. 10/07/14  Yes [place] OuchArida, Muhammad A, MD  senna-docusate (SENOKOT-S) 8.6-50 MG tablet Take 1 tablet by mouth 2 (two) times daily. 05/18/16  Yes Gouru, Deanna ArtisAruna, MD    Allergies Rofecoxib and Penicillins  Family History  Problem Relation Age of Onset  . Hypertension Mother   . Hypertension Father   . CAD Father        s/p cabg  . Heart attack Father   . Heart failure Father     Social History Social History  Substance Use Topics  . Smoking status: Never Smoker  . Smokeless tobacco: Never Used  . Alcohol use No    Review of Systems Constitutional: No fever/chills Eyes: No visual changes. ENT: No sore throat. Cardiovascular: Denies chest pain. Respiratory: Denies shortness of breath. Gastrointestinal: No abdominal pain.  No nausea, no vomiting.  No diarrhea.  No constipation. Genitourinary: Negative for dysuria.Positive for vaginal bleeding Musculoskeletal: Negative for neck pain.  Negative for back pain. Integumentary: Negative for rash. Neurological: Negative for headaches, focal weakness or numbness.   ____________________________________________   PHYSICAL EXAM:  VITAL SIGNS: ED Triage Vitals  Enc Vitals Group     BP 05/25/16 2246 (!) 107/50     Pulse Rate 05/25/16 2246 66     Resp 05/25/16 2246 20     Temp 05/25/16 2246 97.7 F (36.5 C)     Temp Source 05/25/16 2246 Oral     SpO2 05/25/16 2246 96 %     Weight  05/25/16 2244 300 lb (136.1 kg)     Height 05/25/16 2244 5\' 3"  (1.6 m)     Head Circumference --      Peak Flow --      Pain Score --      Pain Loc --      Pain Edu? --      Excl. in GC? --     Constitutional: Alert and oriented. Well appearing and in no acute distress. Eyes: Conjunctivae are normal.  Head: Atraumatic. Mouth/Throat: Mucous membranes are Very dry. Oropharynx non-erythematous. Neck: No stridor.   Cardiovascular: Normal rate, regular rhythm. Good peripheral circulation. Grossly normal heart sounds. Respiratory: Normal respiratory effort.  No retractions. Lungs CTAB. Gastrointestinal: Soft and nontender. No distention.  Genitourinary: Speculum exam revealed active vaginal bleeding dark blood Musculoskeletal: No lower extremity tenderness nor edema. No gross deformities of extremities. Neurologic:  Normal speech and language. No gross  focal neurologic deficits are appreciated.  Skin:  Skin is warm, dry and intact. No rash noted. Psychiatric: Mood and affect are normal. Speech and behavior are normal.  ____________________________________________   LABS (all labs ordered are listed, but only abnormal results are displayed)  Labs Reviewed  COMPREHENSIVE METABOLIC PANEL - Abnormal; Notable for the following:       Result Value   Sodium 133 (*)    Chloride 97 (*)    Glucose, Bld 305 (*)    BUN 114 (*)    Creatinine, Ser 2.67 (*)    Calcium 7.4 (*)    Total Protein 5.7 (*)    Albumin 2.2 (*)    AST 140 (*)    ALT 208 (*)    Alkaline Phosphatase 139 (*)    Total Bilirubin 1.5 (*)    GFR calc non Af Amer 18 (*)    GFR calc Af Amer 21 (*)    All other components within normal limits  CBC WITH DIFFERENTIAL/PLATELET - Abnormal; Notable for the following:    RBC 3.78 (*)    Platelets 101 (*)    Lymphs Abs 0.9 (*)    All other components within normal limits  URINALYSIS, COMPLETE (UACMP) WITH MICROSCOPIC - Abnormal; Notable for the following:    Color, Urine AMBER  (*)    APPearance CLOUDY (*)    Glucose, UA 50 (*)    Bacteria, UA RARE (*)    Squamous Epithelial / LPF 0-5 (*)    All other components within normal limits  PROTIME-INR - Abnormal; Notable for the following:    Prothrombin Time 18.5 (*)    All other components within normal limits  HEPARIN LEVEL (UNFRACTIONATED) - Abnormal; Notable for the following:    Heparin Unfractionated 2.46 (*)    All other components within normal limits  GLUCOSE, CAPILLARY - Abnormal; Notable for the following:    Glucose-Capillary 201 (*)    All other components within normal limits  MRSA PCR SCREENING  LACTIC ACID, PLASMA  APTT  CA 125  HIV ANTIBODY (ROUTINE TESTING)   ____________________________________________  EKG  ED ECG REPORT I, Condon N Rosaleah Person, the attending physician, personally viewed and interpreted this ECG.   Date: 05/26/2016  EKG Time: 10:47 PM  Rate: 75  Rhythm: Atrial fibrillation  Axis: Normal  Intervals: Normal  ST&T Change: None  ____________________________________________  RADIOLOGY I,  N Mahesh Sizemore, personally viewed and evaluated these images (plain radiographs) as part of my medical decision making, as well as reviewing the written report by the radiologist.  US Transvaginal Non-ob  Result Date: 05/26/2016 CLINICAL DATA:  Postmenopausal bleeding.  Patient not on tamoxifen. EXAM: TRANSABDOMINAL AND TRANSVAGINAL ULTRASOUND OF PELVIS TECHNIQUE: Both transabdominal and transvaginal ultrasound examinations of the pelvis were performed. Transabdominal technique was performed for global imaging of the pelvis including uterus, ovaries, adnexal regions, and pelvic cul-de-sac. It was necessary to proceed with endovaginal exam following the transabdominal exam to visualize the uterus and ovaries. COMPARISON:  None FINDINGS: The study was limited by body habitus. Uterus Measurements: 8.4 x 4.2 x 4.7 cm. The uterus is anteverted in appearance. No fibroids or other mass  visualized. Endometrium The margins of the endometrial cavity are not well visualized and inhomogeneous. What appears to be the endometrial stripe is 15 mm in thickness. Right ovary No adnexal mass.  The right ovary was not visualized. Left ovary No adnexal mass.  The left ovary was not visualized. Other findings No abnormal free fluid. IMPRESSION: Neither  ovary was visualized.  No adnexal mass however is seen. Inhomogeneous appearance of the endometrial stripe. What appears to be the endometrial lining measures 15 mm in thickness which is above normal for age. Differential considerations for this appearance would include neoplasia, hyperplasia or polyp. Electronically Signed   By: Tollie Eth M.D.   On: 05/26/2016 02:35   US Pelvis Complete  Result Date: 05/26/2016 CLINICAL DATA:  Postmenopausal bleeding.  Patient not on tamoxifen. EXAM: TRANSABDOMINAL AND TRANSVAGINAL ULTRASOUND OF PELVIS TECHNIQUE: Both transabdominal and transvaginal ultrasound examinations of the pelvis were performed. Transabdominal technique was performed for global imaging of the pelvis including uterus, ovaries, adnexal regions, and pelvic cul-de-sac. It was necessary to proceed with endovaginal exam following the transabdominal exam to visualize the uterus and ovaries. COMPARISON:  None FINDINGS: The study was limited by body habitus. Uterus Measurements: 8.4 x 4.2 x 4.7 cm. The uterus is anteverted in appearance. No fibroids or other mass visualized. Endometrium The margins of the endometrial cavity are not well visualized and inhomogeneous. What appears to be the endometrial stripe is 15 mm in thickness. Right ovary No adnexal mass.  The right ovary was not visualized. Left ovary No adnexal mass.  The left ovary was not visualized. Other findings No abnormal free fluid. IMPRESSION: Neither ovary was visualized.  No adnexal mass however is seen. Inhomogeneous appearance of the endometrial stripe. What appears to be the endometrial  lining measures 15 mm in thickness which is above normal for age. Differential considerations for this appearance would include neoplasia, hyperplasia or polyp. Electronically Signed   By: Tollie Eth M.D.   On: 05/26/2016 02:35   US Abdomen Limited Ruq  Result Date: 05/26/2016 CLINICAL DATA:  Right upper quadrant pain. EXAM: US ABDOMEN LIMITED - RIGHT UPPER QUADRANT COMPARISON:  None. FINDINGS: Gallbladder: Physiologically distended. Multiple gallstones largest measuring 5 mm. No gallbladder wall thickening, wall thickness of 2.5 mm. No pericholecystic fluid. No sonographic Diener sign noted by sonographer. Common bile duct: Diameter: 3.6 mm, normal. Liver: No focal lesion identified. Mild diffuse increase in parenchymal echogenicity. Normal directional flow in the main portal vein. IMPRESSION: 1. Cholelithiasis without sonographic findings of acute cholecystitis. 2. Mild hepatic steatosis. Electronically Signed   By: Rubye Oaks M.D.   On: 05/26/2016 01:40    Procedures   ____________________________________________   INITIAL IMPRESSION / ASSESSMENT AND PLAN / ED COURSE  Pertinent labs & imaging results that were available during my care of the patient were reviewed by me and considered in my medical decision making (see chart for details).        ____________________________________________  FINAL CLINICAL IMPRESSION(S) / ED DIAGNOSES  Renal insufficiency Vaginal bleeding  MEDICATIONS GIVEN DURING THIS VISIT:  Medications  metoprolol tartrate (LOPRESSOR) tablet 50 mg (not administered)  rosuvastatin (CRESTOR) tablet 10 mg (not administered)  senna-docusate (Senokot-S) tablet 1 tablet (not administered)  digoxin (LANOXIN) tablet 0.25 mg (not administered)  diltiazem (CARDIZEM CD) 24 hr capsule 180 mg (not administered)  furosemide (LASIX) tablet 40 mg (not administered)  HYDROcodone-acetaminophen (NORCO) 7.5-325 MG per tablet 1 tablet (not administered)  clindamycin  (CLEOCIN) capsule 300 mg (not administered)  loperamide (IMODIUM) capsule 2 mg (not administered)  nystatin (MYCOSTATIN/NYSTOP) topical powder 1 g (not administered)  0.9 %  sodium chloride infusion ( Intravenous New Bag/Given 05/26/16 0630)  acetaminophen (TYLENOL) tablet 650 mg (not administered)    Or  acetaminophen (TYLENOL) suppository 650 mg (not administered)  ondansetron (ZOFRAN) tablet 4 mg (not administered)  Or  ondansetron (ZOFRAN) injection 4 mg (not administered)  insulin aspart (novoLOG) injection 0-15 Units (not administered)  insulin glargine (LANTUS) injection 36 Units (not administered)  insulin aspart (novoLOG) injection 0-5 Units (not administered)  levofloxacin (LEVAQUIN) tablet 500 mg (not administered)  levofloxacin (LEVAQUIN) tablet 250 mg (not administered)  heparin ADULT infusion 100 units/mL (25000 units/250mL sodium chloride 0.45%) (not administered)  sodium chloride 0.9 % bolus 1,000 mL (0 mLs Intravenous Stopped 05/26/16 0100)  oxyCODONE-acetaminophen (PERCOCET/ROXICET) 5-325 MG per tablet 1 tablet ( Oral Canceled Entry 05/26/16 0629)  sodium chloride 0.9 % bolus 1,000 mL (0 mLs Intravenous Stopped 05/26/16 0518)     NEW OUTPATIENT MEDICATIONS STARTED DURING THIS VISIT:  Current Discharge Medication List      Current Discharge Medication List      Current Discharge Medication List       Note:  This document was prepared using Dragon voice recognition software and may include unintentional dictation errors.    Darci Current, MD 05/26/16 [phone]

## 2016-05-26 NOTE — ED Notes (Signed)
Report to kenny, rn. 

## 2016-05-26 NOTE — ED Notes (Signed)
Pt transport to 124 

## 2016-05-26 NOTE — ED Notes (Signed)
Pt returned from ultrasound and repositioned in bed for comfort.

## 2016-05-26 NOTE — ED Notes (Signed)
Pt cleansed of diarrhea and light vaginal bleeding. Pt repositioned in bed multiple times for comfort.

## 2016-05-26 NOTE — Consult Note (Addendum)
Consult Note  Requesting Attending Physician :  Laura Kline, Sona, MD   Assessment/Recommendations:  Laura MiniumSabrina Kline is a 61 y.o. female seen in consultation at the request of Laura Kline, Sona, MD regarding post-menopausal bleeding.  Active Problems:   AKI (acute kidney injury) (HCC)   RUQ pain   Post menopausal bleeding   Thickened endometrium by U/S   Morbid obesity    HPI : This patient is a 61 year old woman who has been menopausal for approximately 8 years. She is currently residing in a nursing care facility. She has recently undergone surgery on her foot. She is a poor historian regarding her medical care. One day prior to admission the nursing staff at her facility noted a moderate amount of vaginal bleeding - separate from the possibility of rectal bleeding. The bleeding was described as dark blood. The patient was unaware of this bleeding and could not add any information regarding the amount or length of time she had been bleeding.  Upon being admitted to the emergency department she was found to have acute renal changes and this became the primary reason for admission. I was consult it to evaluate and manage her vaginal bleeding and to determine the contribution of a pelvic ultrasound revealing a thickened endometrium..  Allergies: Allergies as of 05/25/2016 - Review Complete 05/25/2016  Allergen Reaction Noted  . Rofecoxib Other (See Comments) 08/02/2014  . Penicillins Rash 07/26/2014     Medications:   Current Facility-Administered Medications:  .  0.9 %  sodium chloride infusion, , Intravenous, Continuous, Arnaldo Nataliamond, Michael S, MD, Last Rate: 150 mL/hr at 05/26/16 0705 .  acetaminophen (TYLENOL) tablet 650 mg, 650 mg, Oral, Q6H PRN **OR** acetaminophen (TYLENOL) suppository 650 mg, 650 mg, Rectal, Q6H PRN, Arnaldo Nataliamond, Michael S, MD .  clindamycin (CLEOCIN) capsule 300 mg, 300 mg, Oral, QID, Arnaldo Nataliamond, Michael S, MD, 300 mg at 05/26/16 1242 .  digoxin (LANOXIN) tablet 0.25 mg,  0.25 mg, Oral, Daily, Arnaldo Nataliamond, Michael S, MD, 0.25 mg at 05/26/16 0835 .  diltiazem (CARDIZEM CD) 24 hr capsule 180 mg, 180 mg, Oral, Daily, Arnaldo Nataliamond, Michael S, MD .  furosemide (LASIX) tablet 40 mg, 40 mg, Oral, Daily, Arnaldo Nataliamond, Michael S, MD, 40 mg at 05/26/16 0835 .  heparin ADULT infusion 100 units/mL (25000 units/24050mL sodium chloride 0.45%), 1,300 Units/hr, Intravenous, Continuous, Arnaldo Nataliamond, Michael S, MD, Last Rate: 13 mL/hr at 05/26/16 0823, 1,300 Units/hr at 05/26/16 0823 .  HYDROcodone-acetaminophen (NORCO) 7.5-325 MG per tablet 1 tablet, 1 tablet, Oral, Q6H PRN, Arnaldo Nataliamond, Michael S, MD .  insulin aspart (novoLOG) injection 0-15 Units, 0-15 Units, Subcutaneous, TID WC, Arnaldo Nataliamond, Michael S, MD, 5 Units at 05/26/16 1231 .  insulin aspart (novoLOG) injection 0-5 Units, 0-5 Units, Subcutaneous, Once, Arnaldo Nataliamond, Michael S, MD .  insulin glargine (LANTUS) injection 36 Units, 36 Units, Subcutaneous, QHS, Arnaldo Nataliamond, Michael S, MD .  Melene Muller[START ON 05/27/2016] levofloxacin (LEVAQUIN) tablet 250 mg, 250 mg, Oral, Daily, Arnaldo Nataliamond, Michael S, MD .  loperamide (IMODIUM) capsule 2 mg, 2 mg, Oral, Q8H PRN, Arnaldo Nataliamond, Michael S, MD .  metoprolol tartrate (LOPRESSOR) tablet 50 mg, 50 mg, Oral, BID, Arnaldo Nataliamond, Michael S, MD .  norethindrone (AYGESTIN) tablet 5 mg, 5 mg, Oral, TID, Linzie CollinEvans, Alissah Redmon James, MD, 5 mg at 05/26/16 1242 .  nystatin (MYCOSTATIN/NYSTOP) topical powder 1 g, 1 g, Topical, TID, Arnaldo Nataliamond, Michael S, MD, 1 g at 05/26/16 0831 .  ondansetron (ZOFRAN) tablet 4 mg, 4 mg, Oral, Q6H PRN **OR** ondansetron (ZOFRAN) injection 4 mg, 4 mg, Intravenous, Q6H PRN,  Arnaldo Natal, MD .  rosuvastatin (CRESTOR) tablet 10 mg, 10 mg, Oral, QPC supper, Arnaldo Natal, MD .  senna-docusate (Senokot-S) tablet 1 tablet, 1 tablet, Oral, BID, Arnaldo Natal, MD, 1 tablet at 05/26/16 1610  Medical History: Past Medical History:  Diagnosis Date  . Coronary atherosclerosis    LAD calcifications noted on CT scan in 2016  with negative nuclear stress test.  . DKA (diabetic ketoacidoses) (HCC)    a. 05/2009  . Essential hypertension   . History of Bell's palsy   . Hyperlipidemia   . Hypertension   . Hypokalemia   . IDDM (insulin dependent diabetes mellitus) (HCC)   . Long-term insulin use (HCC)   . Morbid obesity (HCC)   . Peripheral vascular disease (HCC)      Social History: Social History   Social History  . Marital status: Married    Spouse name: N/A  . Number of children: N/A  . Years of education: N/A   Social History Main Topics  . Smoking status: Never Smoker  . Smokeless tobacco: Never Used  . Alcohol use No  . Drug use: No  . Sexual activity: Yes   Other Topics Concern  . None   Social History Narrative  . None    Family History: family history includes CAD in her father; Heart attack in her father; Heart failure in her father; Hypertension in her father and mother.  Review of Systems: Gyn : pt is poor historian and "did not even know I was having bleeding until they told me." Constitutional: Negative for chills and fever.  HENT: Negative.   Eyes: Negative.   Respiratory: Negative.   Cardiovascular: Negative.   Gastrointestinal: Negative for abdominal pain, constipation, diarrhea, heartburn, nausea and vomiting.  Genitourinary: Negative.   Musculoskeletal: Does c/o joint pain.  Also has had recent foot surgery Skin: c/o rash  Neurological: Negative.  A&O x3 Endo/Heme: Negative.   Psychiatric/Behavioral: Negative.    Objective : U/S :  15mm endometrium.  (thickened for menopause)   Min-mod vaginal bleeding  Vital signs in last 24 hours: Temp:  [97.7 F (36.5 C)] 97.7 F (36.5 C) (05/18 2246) Pulse Rate:  [51-128] 73 (05/19 0823) Resp:  [15-26] 20 (05/19 0500) BP: (79-118)/(38-63) 118/59 (05/19 0823) SpO2:  [94 %-100 %] 100 % (05/19 0823) Weight:  [300 lb (136.1 kg)] 300 lb (136.1 kg) (05/18 2244)  Intake/Output last 3 shifts: I/O last 3 completed  shifts: In: 1999 [IV Piggyback:1999] Out: -    Physical Exam: Non contributory  Assesment:  1.  With pt's h/o DM, obesity, post-menopausal bleeding and thickened endometrium she is as significant risk for endometrial hyperplasia or cancer.  2.  Currently on Heparin and previously on xerelto.  Likely contributing to vaginal bleeding.   3.  Acute renal issues.   Plan:  1.  Aygestin TID to stop vaginal bleeding  2.  Recommend endometrial bx - must do as outpatient after pt's renal problems resolved.  [facility] - endometrial pipelle bx not available)  Workup can easily continue as outpatient.  3.  Continue TID aygestin at discharge until she can be seen as outpatient.  (Discussed care plan with Dr. Allena Katz)  Elonda Husky, M.D. 05/26/2016 12:40 PM

## 2016-05-26 NOTE — Progress Notes (Signed)
[facility] [place] [facility]   PATIENT NAME: Laura MiniumSabrina Kline    MR#:  [id number]  DATE OF BIRTH:  04/09/1955  SUBJECTIVE:   Came in with vaginal bleeding that was noticed by staff at nursing home. Patient denies any vaginal discharge before. She is postmenopausal since 2011 Denies any other complaints. She feels weak. REVIEW OF SYSTEMS:   Review of Systems  Constitutional: Negative for chills, fever and weight loss.  HENT: Negative for ear discharge, ear pain and nosebleeds.   Eyes: Negative for blurred vision, pain and discharge.  Respiratory: Negative for sputum production, shortness of breath, wheezing and stridor.   Cardiovascular: Negative for chest pain, palpitations, orthopnea and PND.  Gastrointestinal: Negative for abdominal pain, diarrhea, nausea and vomiting.  Genitourinary: Negative for frequency and urgency.       Vaginal bleeding+  Musculoskeletal: Negative for back pain and joint pain.  Neurological: Positive for weakness. Negative for sensory change, speech change and focal weakness.  Psychiatric/Behavioral: Negative for depression and hallucinations. The patient is not nervous/anxious.    Tolerating Diet: Tolerating PT:   DRUG ALLERGIES:   Allergies  Allergen Reactions  . Rofecoxib Other (See Comments)    Reaction: unknown  . Penicillins Rash and Other (See Comments)    Has patient had a PCN reaction causing immediate rash, facial/tongue/throat swelling, SOB or lightheadedness with hypotension: Unknown Has patient had a PCN reaction causing severe rash involving mucus membranes or skin necrosis: Unknown Has patient had a PCN reaction that required hospitalization: Unknown Has patient had a PCN reaction occurring within the last 10 years: Unknown If all of the above answers are "NO", then may proceed with Cephalosporin use.     VITALS:  Blood pressure (!) 118/59, pulse 73, temperature 97.7 F (36.5 C), temperature source Oral,  resp. rate 20, height 5\' 3"  (1.6 m), weight 136.1 kg (300 lb), SpO2 100 %.  PHYSICAL EXAMINATION:   Physical Exam  GENERAL:  61 y.o.-year-old patient lying in the bed with no acute distress. Morbidly obese appears chronically ill EYES: Pupils equal, round, reactive to light and accommodation. No scleral icterus. Extraocular muscles intact.  HEENT: Head atraumatic, normocephalic. Oropharynx and nasopharynx clear.  NECK:  Supple, no jugular venous distention. No thyroid enlargement, no tenderness.  LUNGS: Normal breath sounds bilaterally, no wheezing, rales, rhonchi. No use of accessory muscles of respiration.  CARDIOVASCULAR: S1, S2 normal. No murmurs, rubs, or gallops.  ABDOMEN: Soft, nontender, nondistended. Bowel sounds present. No organomegaly or mass.  EXTREMITIES: Bilateral lower extremity pitting edema 2+. Left second toe sutures present from previous surgery is her ulcer present on the dorsum of the foot in the bed greater and second metatarsal  NEUROLOGIC: Cranial nerves II through XII are intact. No focal Motor or sensory deficits b/l.   PSYCHIATRIC:  patient is alert and oriented x 3.  SKIN: No obvious rash, lesion, or ulcer.   LABORATORY PANEL:  CBC  Recent Labs Lab 05/25/16 2252  WBC 3.6  HGB 12.0  HCT 35.1  PLT 101*    Chemistries   Recent Labs Lab 05/25/16 2252  NA 133*  K 3.5  CL 97*  CO2 22  GLUCOSE 305*  BUN 114*  CREATININE 2.67*  CALCIUM 7.4*  AST 140*  ALT 208*  ALKPHOS 139*  BILITOT 1.5*   Cardiac Enzymes No results for input(s): TROPONINI in the last 168 hours. RADIOLOGY:  [place] Transvaginal Non-ob  Result Date: 05/26/2016 CLINICAL DATA:  Postmenopausal bleeding.  Patient not  on tamoxifen. EXAM: TRANSABDOMINAL AND TRANSVAGINAL ULTRASOUND OF PELVIS TECHNIQUE: Both transabdominal and transvaginal ultrasound examinations of the pelvis were performed. Transabdominal technique was performed for global imaging of the pelvis including uterus, ovaries,  adnexal regions, and pelvic cul-de-sac. It was necessary to proceed with endovaginal exam following the transabdominal exam to visualize the uterus and ovaries. COMPARISON:  None FINDINGS: The study was limited by body habitus. Uterus Measurements: 8.4 x 4.2 x 4.7 cm. The uterus is anteverted in appearance. No fibroids or other mass visualized. Endometrium The margins of the endometrial cavity are not well visualized and inhomogeneous. What appears to be the endometrial stripe is 15 mm in thickness. Right ovary No adnexal mass.  The right ovary was not visualized. Left ovary No adnexal mass.  The left ovary was not visualized. Other findings No abnormal free fluid. IMPRESSION: Neither ovary was visualized.  No adnexal mass however is seen. Inhomogeneous appearance of the endometrial stripe. What appears to be the endometrial lining measures 15 mm in thickness which is above normal for age. Differential considerations for this appearance would include neoplasia, hyperplasia or polyp. Electronically Signed   By: Tollie Eth M.D.   On: 05/26/2016 02:35   US Pelvis Complete  Result Date: 05/26/2016 CLINICAL DATA:  Postmenopausal bleeding.  Patient not on tamoxifen. EXAM: TRANSABDOMINAL AND TRANSVAGINAL ULTRASOUND OF PELVIS TECHNIQUE: Both transabdominal and transvaginal ultrasound examinations of the pelvis were performed. Transabdominal technique was performed for global imaging of the pelvis including uterus, ovaries, adnexal regions, and pelvic cul-de-sac. It was necessary to proceed with endovaginal exam following the transabdominal exam to visualize the uterus and ovaries. COMPARISON:  None FINDINGS: The study was limited by body habitus. Uterus Measurements: 8.4 x 4.2 x 4.7 cm. The uterus is anteverted in appearance. No fibroids or other mass visualized. Endometrium The margins of the endometrial cavity are not well visualized and inhomogeneous. What appears to be the endometrial stripe is 15 mm in thickness.  Right ovary No adnexal mass.  The right ovary was not visualized. Left ovary No adnexal mass.  The left ovary was not visualized. Other findings No abnormal free fluid. IMPRESSION: Neither ovary was visualized.  No adnexal mass however is seen. Inhomogeneous appearance of the endometrial stripe. What appears to be the endometrial lining measures 15 mm in thickness which is above normal for age. Differential considerations for this appearance would include neoplasia, hyperplasia or polyp. Electronically Signed   By: Tollie Eth M.D.   On: 05/26/2016 02:35   US Abdomen Limited Ruq  Result Date: 05/26/2016 CLINICAL DATA:  Right upper quadrant pain. EXAM: US ABDOMEN LIMITED - RIGHT UPPER QUADRANT COMPARISON:  None. FINDINGS: Gallbladder: Physiologically distended. Multiple gallstones largest measuring 5 mm. No gallbladder wall thickening, wall thickness of 2.5 mm. No pericholecystic fluid. No sonographic Lievanos sign noted by sonographer. Common bile duct: Diameter: 3.6 mm, normal. Liver: No focal lesion identified. Mild diffuse increase in parenchymal echogenicity. Normal directional flow in the main portal vein. IMPRESSION: 1. Cholelithiasis without sonographic findings of acute cholecystitis. 2. Mild hepatic steatosis. Electronically Signed   By: Rubye Oaks M.D.   On: 05/26/2016 01:40   ASSESSMENT AND PLAN:   61 year-old female admitted for acute kidney injury and vaginal bleeding.  1. AKI: Secondary to dehydration The patient's baseline creatinine around 1.5. -Avoid nephrotoxic agents.  -Hydrate with intravenous fluid.  2. Vaginal bleeding: Patient is postmenopausal  -She was seen by Dr. Logan Bores from GYN recommends patient  Will need biopsy of endometrium. Biopsy  will be done as outpatient -Pending CA 125.  -We'll set up biopsy as outpatient once patient is medically stable.  3. Acute Transaminitis: Unclear etiology  -Ultrasound of the abdomen shows cholelithiasis. Patient is  asymptomatic -We'll get surgery opinion  4. Diabetes mellitus type 2: Continue basal insulin adjusted for hospital diet. Sliding-scale insulin while hospitalized. held metformin  5. Atrial fibrillation: Rate controlled; continue diltiazem and digoxin. -stopped the patient's Eliquis  due to vaginal bleeding. Patient will be started on oral progesterone by GYN and once bleeding stops will discuss with GYN if patient can be resumed back on oral anticoagulation   6. CAD: Stable  7. Hypertension: Controlled; continue metoprolol  8. Hyperlipidemia: Continue statin therapy  9. Left foot Cellulitis: Interdigital space left first and second toes clean and dressed. No osteomyelitis seen on MRI this month. Continue Clinda and Levaquin.  -Patient had left foot surgery for foreign body and abscess about 10-12 days ago -Get podiatry to see patient ensure healing well  10. DVT prophylaxis: Heparin  Case discussed with Care Management/Social Worker. Management plans discussed with the patient, family and they are in agreement.  CODE STATUS: Full   TOTAL TIME TAKING CARE OF THIS PATIENT: *30* minutes.  >50% time spent on counselling and coordination of care patient and husband  POSSIBLE D/C IN 2-3 DAYS, DEPENDING ON CLINICAL CONDITION.  Note: This dictation was prepared with Dragon dictation along with smaller phrase technology. Any transcriptional errors that result from this process are unintentional.  Laura Kline M.D on 05/26/2016 at 11:40 AM  Between 7am to 6pm - Pager - [phone]  After 6pm go to [email or web address] - password [email or web address]  [facility]  Office  [phone]  CC: Primary care physician; Patient, No Pcp Per

## 2016-05-26 NOTE — ED Notes (Signed)
Patient transported to Ultrasound 

## 2016-05-26 NOTE — NC FL2 (Signed)
[place] MEDICAID FL2 LEVEL OF CARE SCREENING TOOL     IDENTIFICATION  Patient Name: Laura Kline Birthdate: [birth year] Sex: female Admission Date (Current Location): 05/25/2016  [place] and [place] Number:  Chiropodist and Address:  [facility], [address] [place], [place] [address]      Provider Number: [id number]  Attending Physician Name and Address:  Enedina Finner, MD  Relative Name and Phone Number:       Current Level of Care: Hospital Recommended Level of Care: Skilled Nursing Facility Prior Approval Number:    Date Approved/Denied:   PASRR Number: [id number]  Discharge Plan: SNF    Current Diagnoses: Patient Active Problem List   Diagnosis Date Noted  . AKI (acute kidney injury) (HCC) 05/26/2016  . RUQ pain   . Atrial fibrillation with rapid ventricular response (HCC)   . Cellulitis of left lower extremity   . Morbid obesity (HCC) 05/17/2016  . Persistent atrial fibrillation (HCC) 05/17/2016  . Sepsis (HCC) 05/12/2016  . Hyperlipidemia   . Essential hypertension   . Coronary atherosclerosis   . Chest pain 07/26/2014    Orientation RESPIRATION BLADDER Height & Weight     Self, Time, Situation, Place  Normal Continent Weight: 300 lb (136.1 kg) Height:  5\' 3"  (160 cm)  BEHAVIORAL SYMPTOMS/MOOD NEUROLOGICAL BOWEL NUTRITION STATUS      Continent Diet (Carb modified/Heart Healthy)  AMBULATORY STATUS COMMUNICATION OF NEEDS Skin   Extensive Assist Verbally Normal                       Personal Care Assistance Level of Assistance    Bathing Assistance: Limited assistance Feeding assistance: Independent Dressing Assistance: Limited assistance Total Care Assistance: Independent   Functional Limitations Info    Sight Info: Adequate Hearing Info: Adequate      SPECIAL CARE FACTORS FREQUENCY  PT (By licensed PT)     PT Frequency: Up to 5X per day, 5 days per week              Contractures  Contractures Info: Present    Additional Factors Info  Allergies Code Status Info: Full Allergies Info: Rofecoxib, Penicillins   Insulin Sliding Scale Info: N/A       Current Medications (05/26/2016):  This is the current hospital active medication list Current Facility-Administered Medications  Medication Dose Route Frequency Provider Last Rate Last Dose  . 0.9 %  sodium chloride infusion   Intravenous Continuous Arnaldo Natal, MD 150 mL/hr at 05/26/16 1331    . acetaminophen (TYLENOL) tablet 650 mg  650 mg Oral Q6H PRN Arnaldo Natal, MD       Or  . acetaminophen (TYLENOL) suppository 650 mg  650 mg Rectal Q6H PRN Arnaldo Natal, MD      . clindamycin (CLEOCIN) capsule 300 mg  300 mg Oral QID Arnaldo Natal, MD   300 mg at 05/26/16 1242  . digoxin (LANOXIN) tablet 0.25 mg  0.25 mg Oral Daily Arnaldo Natal, MD   0.25 mg at 05/26/16 0835  . diltiazem (CARDIZEM CD) 24 hr capsule 180 mg  180 mg Oral Daily Arnaldo Natal, MD      . furosemide (LASIX) tablet 40 mg  40 mg Oral Daily Arnaldo Natal, MD   40 mg at 05/26/16 0835  . HYDROcodone-acetaminophen (NORCO) 7.5-325 MG per tablet 1 tablet  1 tablet Oral Q6H PRN Arnaldo Natal, MD      .  insulin aspart (novoLOG) injection 0-15 Units  0-15 Units Subcutaneous TID WC Arnaldo Nataliamond, Michael S, MD   5 Units at 05/26/16 1231  . insulin aspart (novoLOG) injection 0-5 Units  0-5 Units Subcutaneous Once Arnaldo Nataliamond, Michael S, MD      . insulin glargine (LANTUS) injection 36 Units  36 Units Subcutaneous QHS Arnaldo Nataliamond, Michael S, MD      . Melene Muller[START ON 05/27/2016] levofloxacin (LEVAQUIN) tablet 250 mg  250 mg Oral Daily Arnaldo Nataliamond, Michael S, MD      . loperamide (IMODIUM) capsule 2 mg  2 mg Oral Q8H PRN Arnaldo Nataliamond, Michael S, MD      . metoprolol tartrate (LOPRESSOR) tablet 50 mg  50 mg Oral BID Arnaldo Nataliamond, Michael S, MD      . norethindrone (AYGESTIN) tablet 5 mg  5 mg Oral TID Linzie CollinEvans, David James, MD   5 mg at 05/26/16 1242  . nystatin  (MYCOSTATIN/NYSTOP) topical powder 1 g  1 g Topical TID Arnaldo Nataliamond, Michael S, MD   1 g at 05/26/16 0831  . ondansetron (ZOFRAN) tablet 4 mg  4 mg Oral Q6H PRN Arnaldo Nataliamond, Michael S, MD       Or  . ondansetron [facility](ZOFRAN) injection 4 mg  4 mg Intravenous Q6H PRN Arnaldo Nataliamond, Michael S, MD      . rosuvastatin (CRESTOR) tablet 10 mg  10 mg Oral QPC supper Arnaldo Nataliamond, Michael S, MD      . senna-docusate (Senokot-S) tablet 1 tablet  1 tablet Oral BID Arnaldo Nataliamond, Michael S, MD   1 tablet at 05/26/16 16100835     Discharge Medications: Please see discharge summary for a list of discharge medications.  Relevant Imaging Results:  Relevant Lab Results:   Additional Information SS# [id number]  Judi CongKaren M Kearston Putman, LCSW

## 2016-05-26 NOTE — ED Notes (Signed)
Pt's BP noted to be 79/47; readjusted BP cuff, retook BP, BP noted to be 85/38; pt's arm and head adjusted and retook BP, BP noted to be 97/43; MD notified; verbal order obtain to hang liter of NS

## 2016-05-26 NOTE — Clinical Social Work Note (Signed)
Clinical Social Work Assessment  Patient Details  Name: Laura Kline MRN: [id number] Date of Birth: 05/04/1955  Date of referral:  05/26/16               Reason for consult:  Facility Placement                Permission sought to share information with:  Facility Industrial/product designerContact Representative Permission granted to share information::  Yes, Verbal Permission Granted  Name::        Agency::     Relationship::     Contact Information:     Housing/Transportation Living arrangements for the past 2 months:  Skilled Nursing Facility Source of Information:  Patient Patient Interpreter Needed:  None Criminal Activity/Legal Involvement Pertinent to Current Situation/Hospitalization:  No - Comment as needed Significant Relationships:  Spouse Lives with:  Spouse Do you feel safe going back to the place where you live?  Yes Need for family participation in patient care:  No (Coment)  Care giving concerns:  The patient was admitted from [facility]   Social Worker assessment / plan:  The CSW spoke with the patient to discuss discharge planning. The patient indicated that she wants to talk to her husband about possibly returning home rather than back to the SNF. The CSW asked if she would prefer another SNF, and the patient indicated that she is not sure. The CSW will discuss further with the patient once she is able to discuss with her spouse.  [facility] has confirmed that the patient can return when stable.  Employment status:  Retired Health and safety inspectornsurance information:  Medicare PT Recommendations:  Not assessed at this time Information / Referral to community resources:     Patient/Family's Response to care:  The patient thanked the CSW.  Patient/Family's Understanding of and Emotional Response to Diagnosis, Current Treatment, and Prognosis:  The patient is worried about possible findings from an upcoming biopsy.  Emotional Assessment Appearance:  Appears stated  age Attitude/Demeanor/Rapport:  Lethargic Affect (typically observed):  Appropriate Orientation:  Oriented to Self, Oriented to Place, Oriented to  Time, Oriented to Situation Alcohol / Substance use:  Never Used Psych involvement (Current and /or in the community):  No (Comment)  Discharge Needs  Concerns to be addressed:  Care Coordination, Discharge Planning Concerns Readmission within the last 30 days:  Yes Current discharge risk:  None Barriers to Discharge:  Continued Medical Work up   [employer] CorporationKaren M Mandisa Persinger, LCSW 05/26/2016, 3:32 PM

## 2016-05-26 NOTE — Consult Note (Signed)
Surgical Consultation  05/26/2016  Laura Kline is an 61 y.o. female.   CC: Increase LFTs  HPI: This patient with known gallstones and increased LFTs as asked see the patient. She was admitted for multiple medical problems. Currently the patient denies any nausea vomiting and she has absolutely no abdominal pain at this point she's had no right upper quadrant pain and her recent past.  Past Medical History:  Diagnosis Date  . Coronary atherosclerosis    LAD calcifications noted on CT scan in 2016 with negative nuclear stress test.  . DKA (diabetic ketoacidoses) ([place])    a. 05/2009  . Essential hypertension   . History of Bell's palsy   . Hyperlipidemia   . Hypertension   . Hypokalemia   . IDDM (insulin dependent diabetes mellitus) ([place])   . Long-term insulin use ([place])   . Morbid obesity ([place])   . Peripheral vascular disease [facility])     Past Surgical History:  Procedure Laterality Date  . CATARACT EXTRACTION W/PHACO Left 02/08/2015   Procedure: CATARACT EXTRACTION PHACO AND INTRAOCULAR LENS PLACEMENT (IOC);  Surgeon: Birder Robson, MD;  Location: [facility] ORS;  Service: Ophthalmology;  Laterality: Left;  [place] 01:33 AP% 15.9 CDE 14.94 flyuid pack lot # [id number]  . CESAREAN SECTION     x2  . EYE SURGERY Right    Cataract Extraction with IOL  . FOREIGN BODY REMOVAL Left 05/13/2016   Procedure: REMOVAL FOREIGN BODY EXTREMITY;  Surgeon: Albertine Patricia, DPM;  Location: [facility] ORS;  Service: Podiatry;  Laterality: Left;  . INCISION AND DRAINAGE Left 05/13/2016   Procedure: INCISION AND DRAINAGE;  Surgeon: Albertine Patricia, DPM;  Location: [facility] ORS;  Service: Podiatry;  Laterality: Left;    Family History  Problem Relation Age of Onset  . Hypertension Mother   . Hypertension Father   . CAD Father        s/p cabg  . Heart attack Father   . Heart failure Father     Social History:  reports that she has never smoked. She has never used smokeless tobacco. She reports that she does not  drink alcohol or use drugs.  Allergies:  Allergies  Allergen Reactions  . Rofecoxib Other (See Comments)    Reaction: unknown  . Penicillins Rash and Other (See Comments)    Has patient had a PCN reaction causing immediate rash, facial/tongue/throat swelling, SOB or lightheadedness with hypotension: Unknown Has patient had a PCN reaction causing severe rash involving mucus membranes or skin necrosis: Unknown Has patient had a PCN reaction that required hospitalization: Unknown Has patient had a PCN reaction occurring within the last 10 years: Unknown If all of the above answers are "NO", then may proceed with Cephalosporin use.     Medications reviewed.   Review of Systems:   Review of Systems  Constitutional: Negative for chills and fever.  HENT: Negative.   Eyes: Negative.   Respiratory: Negative.   Cardiovascular: Negative.   Gastrointestinal: Negative for abdominal pain, constipation, diarrhea, heartburn, nausea and vomiting.  Genitourinary: Negative.   Musculoskeletal: Positive for joint pain and myalgias. Negative for back pain and neck pain.  Skin: Positive for rash. Negative for itching.  Neurological: Negative.   Endo/Heme/Allergies: Negative.   Psychiatric/Behavioral: Negative.      Physical Exam:  BP (!) 118/59   Pulse 73   Temp 97.7 F (36.5 C) (Oral)   Resp 20   Ht 5' 3"  (1.6 m)   Wt 300 lb (136.1 kg)   SpO2 100%  BMI 53.14 kg/m   Physical Exam  Constitutional: She is oriented to person, place, and time.  Morbidly obese female patient in no acute distress  HENT:  Head: Normocephalic and atraumatic.  Eyes: Pupils are equal, round, and reactive to light. Right eye exhibits no discharge. Left eye exhibits no discharge. No scleral icterus.  Cardiovascular: Normal rate and regular rhythm.   Pulmonary/Chest: Effort normal. No respiratory distress.  Abdominal: Soft. She exhibits no distension. There is no tenderness. There is no rebound and no  guarding.  Completely nontender and negative Knoche sign  Musculoskeletal: She exhibits edema, tenderness and deformity.  Erythema of the left leg  Neurological: She is alert and oriented to person, place, and time.  Skin: Skin is warm. Rash noted. There is erythema.  Vitals reviewed.     Results for orders placed or performed during the hospital encounter of 05/25/16 (from the past 48 hour(s))  Comprehensive metabolic panel     Status: Abnormal   Collection Time: 05/25/16 10:52 PM  Result Value Ref Range   Sodium 133 (L) 135 - 145 mmol/L   Potassium 3.5 3.5 - 5.1 mmol/L   Chloride 97 (L) 101 - 111 mmol/L   CO2 22 22 - 32 mmol/L   Glucose, Bld 305 (H) 65 - 99 mg/dL   BUN 114 (H) 6 - 20 mg/dL    Comment: RESULT CONFIRMED BY MANUAL DILUTION.MSS   Creatinine, Ser 2.67 (H) 0.44 - 1.00 mg/dL   Calcium 7.4 (L) 8.9 - 10.3 mg/dL   Total Protein 5.7 (L) 6.5 - 8.1 g/dL   Albumin 2.2 (L) 3.5 - 5.0 g/dL   AST 140 (H) 15 - 41 U/L   ALT 208 (H) 14 - 54 U/L   Alkaline Phosphatase 139 (H) 38 - 126 U/L   Total Bilirubin 1.5 (H) 0.3 - 1.2 mg/dL   GFR calc non Af Amer 18 (L) >60 mL/min   GFR calc Af Amer 21 (L) >60 mL/min    Comment: (NOTE) The eGFR has been calculated using the CKD EPI equation. This calculation has not been validated in all clinical situations. eGFR's persistently <60 mL/min signify possible Chronic Kidney Disease.    Anion gap 14 5 - 15  Lactic acid, plasma     Status: None   Collection Time: 05/25/16 10:52 PM  Result Value Ref Range   Lactic Acid, Venous 1.6 0.5 - 1.9 mmol/L  CBC with Differential     Status: Abnormal   Collection Time: 05/25/16 10:52 PM  Result Value Ref Range   WBC 3.6 3.6 - 11.0 K/uL   RBC 3.78 (L) 3.80 - 5.20 MIL/uL   Hemoglobin 12.0 12.0 - 16.0 g/dL   HCT 35.1 35.0 - 47.0 %   MCV 92.8 80.0 - 100.0 fL   MCH 31.7 26.0 - 34.0 pg   MCHC 34.2 32.0 - 36.0 g/dL   RDW 13.1 11.5 - 14.5 %   Platelets 101 (L) 150 - 440 K/uL   Neutrophils Relative %  59 %   Neutro Abs 2.1 1.4 - 6.5 K/uL   Lymphocytes Relative 25 %   Lymphs Abs 0.9 (L) 1.0 - 3.6 K/uL   Monocytes Relative 11 %   Monocytes Absolute 0.4 0.2 - 0.9 K/uL   Eosinophils Relative 5 %   Eosinophils Absolute 0.2 0 - 0.7 K/uL   Basophils Relative 0 %   Basophils Absolute 0.0 0 - 0.1 K/uL  Urinalysis, Complete w Microscopic     Status: Abnormal  Collection Time: 05/25/16 10:52 PM  Result Value Ref Range   Color, Urine AMBER (A) YELLOW    Comment: BIOCHEMICALS MAY BE AFFECTED BY COLOR   APPearance CLOUDY (A) CLEAR   Specific Gravity, Urine 1.017 1.005 - 1.030   pH 5.0 5.0 - 8.0   Glucose, UA 50 (A) NEGATIVE mg/dL   Hgb urine dipstick NEGATIVE NEGATIVE   Bilirubin Urine NEGATIVE NEGATIVE   Ketones, ur NEGATIVE NEGATIVE mg/dL   Protein, ur NEGATIVE NEGATIVE mg/dL   Nitrite NEGATIVE NEGATIVE   Leukocytes, UA NEGATIVE NEGATIVE   RBC / HPF 0-5 0 - 5 RBC/hpf   WBC, UA 6-30 0 - 5 WBC/hpf   Bacteria, UA RARE (A) NONE SEEN   Squamous Epithelial / LPF 0-5 (A) NONE SEEN   Mucous PRESENT    Hyaline Casts, UA PRESENT    Granular Casts, UA PRESENT    Amorphous Crystal PRESENT   APTT     Status: None   Collection Time: 05/26/16  6:13 AM  Result Value Ref Range   aPTT 35 24 - 36 seconds  Protime-INR     Status: Abnormal   Collection Time: 05/26/16  6:13 AM  Result Value Ref Range   Prothrombin Time 18.5 (H) 11.4 - 15.2 seconds   INR 1.52   Heparin level (unfractionated)     Status: Abnormal   Collection Time: 05/26/16  6:13 AM  Result Value Ref Range   Heparin Unfractionated 2.46 (H) 0.30 - 0.70 IU/mL    Comment: RESULTS CONFIRMED BY MANUAL DILUTION        IF HEPARIN RESULTS ARE BELOW EXPECTED VALUES, AND PATIENT DOSAGE HAS BEEN CONFIRMED, SUGGEST FOLLOW UP TESTING OF ANTITHROMBIN III LEVELS.   MRSA PCR Screening     Status: None   Collection Time: 05/26/16  6:40 AM  Result Value Ref Range   MRSA by PCR NEGATIVE NEGATIVE    Comment:        The GeneXpert MRSA Assay  (FDA approved for NASAL specimens only), is one component of a comprehensive MRSA colonization surveillance program. It is not intended to diagnose MRSA infection nor to guide or monitor treatment for MRSA infections.   Glucose, capillary     Status: Abnormal   Collection Time: 05/26/16  7:41 AM  Result Value Ref Range   Glucose-Capillary 201 (H) 65 - 99 mg/dL  Glucose, capillary     Status: Abnormal   Collection Time: 05/26/16 11:28 AM  Result Value Ref Range   Glucose-Capillary 211 (H) 65 - 99 mg/dL   US Transvaginal Non-ob  Result Date: 05/26/2016 CLINICAL DATA:  Postmenopausal bleeding.  Patient not on tamoxifen. EXAM: TRANSABDOMINAL AND TRANSVAGINAL ULTRASOUND OF PELVIS TECHNIQUE: Both transabdominal and transvaginal ultrasound examinations of the pelvis were performed. Transabdominal technique was performed for global imaging of the pelvis including uterus, ovaries, adnexal regions, and pelvic cul-de-sac. It was necessary to proceed with endovaginal exam following the transabdominal exam to visualize the uterus and ovaries. COMPARISON:  None FINDINGS: The study was limited by body habitus. Uterus Measurements: 8.4 x 4.2 x 4.7 cm. The uterus is anteverted in appearance. No fibroids or other mass visualized. Endometrium The margins of the endometrial cavity are not well visualized and inhomogeneous. What appears to be the endometrial stripe is 15 mm in thickness. Right ovary No adnexal mass.  The right ovary was not visualized. Left ovary No adnexal mass.  The left ovary was not visualized. Other findings No abnormal free fluid. IMPRESSION: Neither ovary was visualized.  No adnexal mass however is seen. Inhomogeneous appearance of the endometrial stripe. What appears to be the endometrial lining measures 15 mm in thickness which is above normal for age. Differential considerations for this appearance would include neoplasia, hyperplasia or polyp. Electronically Signed   By: Ashley Royalty M.D.    On: 05/26/2016 02:35   US Pelvis Complete  Result Date: 05/26/2016 CLINICAL DATA:  Postmenopausal bleeding.  Patient not on tamoxifen. EXAM: TRANSABDOMINAL AND TRANSVAGINAL ULTRASOUND OF PELVIS TECHNIQUE: Both transabdominal and transvaginal ultrasound examinations of the pelvis were performed. Transabdominal technique was performed for global imaging of the pelvis including uterus, ovaries, adnexal regions, and pelvic cul-de-sac. It was necessary to proceed with endovaginal exam following the transabdominal exam to visualize the uterus and ovaries. COMPARISON:  None FINDINGS: The study was limited by body habitus. Uterus Measurements: 8.4 x 4.2 x 4.7 cm. The uterus is anteverted in appearance. No fibroids or other mass visualized. Endometrium The margins of the endometrial cavity are not well visualized and inhomogeneous. What appears to be the endometrial stripe is 15 mm in thickness. Right ovary No adnexal mass.  The right ovary was not visualized. Left ovary No adnexal mass.  The left ovary was not visualized. Other findings No abnormal free fluid. IMPRESSION: Neither ovary was visualized.  No adnexal mass however is seen. Inhomogeneous appearance of the endometrial stripe. What appears to be the endometrial lining measures 15 mm in thickness which is above normal for age. Differential considerations for this appearance would include neoplasia, hyperplasia or polyp. Electronically Signed   By: Ashley Royalty M.D.   On: 05/26/2016 02:35   US Abdomen Limited Ruq  Result Date: 05/26/2016 CLINICAL DATA:  Right upper quadrant pain. EXAM: US ABDOMEN LIMITED - RIGHT UPPER QUADRANT COMPARISON:  None. FINDINGS: Gallbladder: Physiologically distended. Multiple gallstones largest measuring 5 mm. No gallbladder wall thickening, wall thickness of 2.5 mm. No pericholecystic fluid. No sonographic Knerr sign noted by sonographer. Common bile duct: Diameter: 3.6 mm, normal. Liver: No focal lesion identified. Mild diffuse  increase in parenchymal echogenicity. Normal directional flow in the main portal vein. IMPRESSION: 1. Cholelithiasis without sonographic findings of acute cholecystitis. 2. Mild hepatic steatosis. Electronically Signed   By: Jeb Levering M.D.   On: 05/26/2016 01:40    Assessment/Plan:  Asked see the patient with gallstones and increased LFTs. She is asymptomatic and has never been symptomatic from her gallstones according to the patient. I see no evidence of acute cholecystitis and while she may have passed a gallstone at some point there are no indications for surgery in this patient at this time she is undergoing workup for additional problems at this time and will be available if needed.  Florene Glen, MD, FACS

## 2016-05-26 NOTE — Consult Note (Signed)
Reason for Consult: Recent surgery left forefoot Referring Physician: Fritzi Mandes  Laura Kline is an 61 y.o. female.  HPI: This is a 61 year old female who within the last couple of weeks had surgery to remove a foreign body in her left forefoot. This was performed by Dr. Elvina Mattes. She has not been seen outpatient for follow-up to this point. Was recently admitted with some vaginal bleeding. Consulted  for postsurgical evaluation.  Past Medical History:  Diagnosis Date  . Coronary atherosclerosis    LAD calcifications noted on CT scan in 2016 with negative nuclear stress test.  . DKA (diabetic ketoacidoses) ([place])    a. 05/2009  . Essential hypertension   . History of Bell's palsy   . Hyperlipidemia   . Hypertension   . Hypokalemia   . IDDM (insulin dependent diabetes mellitus) ([place])   . Long-term insulin use ([place])   . Morbid obesity ([place])   . Peripheral vascular disease [facility])     Past Surgical History:  Procedure Laterality Date  . CATARACT EXTRACTION W/PHACO Left 02/08/2015   Procedure: CATARACT EXTRACTION PHACO AND INTRAOCULAR LENS PLACEMENT (IOC);  Surgeon: Birder Robson, MD;  Location: [facility] ORS;  Service: Ophthalmology;  Laterality: Left;  [place] 01:33 AP% 15.9 CDE 14.94 flyuid pack lot # [id number]  . CESAREAN SECTION     x2  . EYE SURGERY Right    Cataract Extraction with IOL  . FOREIGN BODY REMOVAL Left 05/13/2016   Procedure: REMOVAL FOREIGN BODY EXTREMITY;  Surgeon: Albertine Patricia, DPM;  Location: [facility] ORS;  Service: Podiatry;  Laterality: Left;  . INCISION AND DRAINAGE Left 05/13/2016   Procedure: INCISION AND DRAINAGE;  Surgeon: Albertine Patricia, DPM;  Location: [facility] ORS;  Service: Podiatry;  Laterality: Left;    Family History  Problem Relation Age of Onset  . Hypertension Mother   . Hypertension Father   . CAD Father        s/p cabg  . Heart attack Father   . Heart failure Father     Social History:  reports that she has never smoked. She has never used  smokeless tobacco. She reports that she does not drink alcohol or use drugs.  Allergies:  Allergies  Allergen Reactions  . Rofecoxib Other (See Comments)    Reaction: unknown  . Penicillins Rash and Other (See Comments)    Has patient had a PCN reaction causing immediate rash, facial/tongue/throat swelling, SOB or lightheadedness with hypotension: Unknown Has patient had a PCN reaction causing severe rash involving mucus membranes or skin necrosis: Unknown Has patient had a PCN reaction that required hospitalization: Unknown Has patient had a PCN reaction occurring within the last 10 years: Unknown If all of the above answers are "NO", then may proceed with Cephalosporin use.     Medications:  Scheduled: . clindamycin  300 mg Oral QID  . digoxin  0.25 mg Oral Daily  . diltiazem  180 mg Oral Daily  . furosemide  40 mg Oral Daily  . insulin aspart  0-15 Units Subcutaneous TID WC  . insulin aspart  0-5 Units Subcutaneous Once  . insulin glargine  36 Units Subcutaneous QHS  . [START ON 05/27/2016] levofloxacin  250 mg Oral Daily  . metoprolol tartrate  50 mg Oral BID  . norethindrone  5 mg Oral TID  . nystatin  1 g Topical TID  . rosuvastatin  10 mg Oral QPC supper  . senna-docusate  1 tablet Oral BID    Results for orders placed or performed  during the hospital encounter of 05/25/16 (from the past 48 hour(s))  Comprehensive metabolic panel     Status: Abnormal   Collection Time: 05/25/16 10:52 PM  Result Value Ref Range   Sodium 133 (L) 135 - 145 mmol/L   Potassium 3.5 3.5 - 5.1 mmol/L   Chloride 97 (L) 101 - 111 mmol/L   CO2 22 22 - 32 mmol/L   Glucose, Bld 305 (H) 65 - 99 mg/dL   BUN 114 (H) 6 - 20 mg/dL    Comment: RESULT CONFIRMED BY MANUAL DILUTION.MSS   Creatinine, Ser 2.67 (H) 0.44 - 1.00 mg/dL   Calcium 7.4 (L) 8.9 - 10.3 mg/dL   Total Protein 5.7 (L) 6.5 - 8.1 g/dL   Albumin 2.2 (L) 3.5 - 5.0 g/dL   AST 140 (H) 15 - 41 U/L   ALT 208 (H) 14 - 54 U/L   Alkaline  Phosphatase 139 (H) 38 - 126 U/L   Total Bilirubin 1.5 (H) 0.3 - 1.2 mg/dL   GFR calc non Af Amer 18 (L) >60 mL/min   GFR calc Af Amer 21 (L) >60 mL/min    Comment: (NOTE) The eGFR has been calculated using the CKD EPI equation. This calculation has not been validated in all clinical situations. eGFR's persistently <60 mL/min signify possible Chronic Kidney Disease.    Anion gap 14 5 - 15  Lactic acid, plasma     Status: None   Collection Time: 05/25/16 10:52 PM  Result Value Ref Range   Lactic Acid, Venous 1.6 0.5 - 1.9 mmol/L  CBC with Differential     Status: Abnormal   Collection Time: 05/25/16 10:52 PM  Result Value Ref Range   WBC 3.6 3.6 - 11.0 K/uL   RBC 3.78 (L) 3.80 - 5.20 MIL/uL   Hemoglobin 12.0 12.0 - 16.0 g/dL   HCT 35.1 35.0 - 47.0 %   MCV 92.8 80.0 - 100.0 fL   MCH 31.7 26.0 - 34.0 pg   MCHC 34.2 32.0 - 36.0 g/dL   RDW 13.1 11.5 - 14.5 %   Platelets 101 (L) 150 - 440 K/uL   Neutrophils Relative % 59 %   Neutro Abs 2.1 1.4 - 6.5 K/uL   Lymphocytes Relative 25 %   Lymphs Abs 0.9 (L) 1.0 - 3.6 K/uL   Monocytes Relative 11 %   Monocytes Absolute 0.4 0.2 - 0.9 K/uL   Eosinophils Relative 5 %   Eosinophils Absolute 0.2 0 - 0.7 K/uL   Basophils Relative 0 %   Basophils Absolute 0.0 0 - 0.1 K/uL  Urinalysis, Complete w Microscopic     Status: Abnormal   Collection Time: 05/25/16 10:52 PM  Result Value Ref Range   Color, Urine AMBER (A) YELLOW    Comment: BIOCHEMICALS MAY BE AFFECTED BY COLOR   APPearance CLOUDY (A) CLEAR   Specific Gravity, Urine 1.017 1.005 - 1.030   pH 5.0 5.0 - 8.0   Glucose, UA 50 (A) NEGATIVE mg/dL   Hgb urine dipstick NEGATIVE NEGATIVE   Bilirubin Urine NEGATIVE NEGATIVE   Ketones, ur NEGATIVE NEGATIVE mg/dL   Protein, ur NEGATIVE NEGATIVE mg/dL   Nitrite NEGATIVE NEGATIVE   Leukocytes, UA NEGATIVE NEGATIVE   RBC / HPF 0-5 0 - 5 RBC/hpf   WBC, UA 6-30 0 - 5 WBC/hpf   Bacteria, UA RARE (A) NONE SEEN   Squamous Epithelial / LPF 0-5  (A) NONE SEEN   Mucous PRESENT    Hyaline Casts, UA PRESENT  Granular Casts, UA PRESENT    Amorphous Crystal PRESENT   APTT     Status: None   Collection Time: 05/26/16  6:13 AM  Result Value Ref Range   aPTT 35 24 - 36 seconds  Protime-INR     Status: Abnormal   Collection Time: 05/26/16  6:13 AM  Result Value Ref Range   Prothrombin Time 18.5 (H) 11.4 - 15.2 seconds   INR 1.52   Heparin level (unfractionated)     Status: Abnormal   Collection Time: 05/26/16  6:13 AM  Result Value Ref Range   Heparin Unfractionated 2.46 (H) 0.30 - 0.70 IU/mL    Comment: RESULTS CONFIRMED BY MANUAL DILUTION        IF HEPARIN RESULTS ARE BELOW EXPECTED VALUES, AND PATIENT DOSAGE HAS BEEN CONFIRMED, SUGGEST FOLLOW UP TESTING OF ANTITHROMBIN III LEVELS.   MRSA PCR Screening     Status: None   Collection Time: 05/26/16  6:40 AM  Result Value Ref Range   MRSA by PCR NEGATIVE NEGATIVE    Comment:        The GeneXpert MRSA Assay (FDA approved for NASAL specimens only), is one component of a comprehensive MRSA colonization surveillance program. It is not intended to diagnose MRSA infection nor to guide or monitor treatment for MRSA infections.   Glucose, capillary     Status: Abnormal   Collection Time: 05/26/16  7:41 AM  Result Value Ref Range   Glucose-Capillary 201 (H) 65 - 99 mg/dL  Glucose, capillary     Status: Abnormal   Collection Time: 05/26/16 11:28 AM  Result Value Ref Range   Glucose-Capillary 211 (H) 65 - 99 mg/dL    US Transvaginal Non-ob  Result Date: 05/26/2016 CLINICAL DATA:  Postmenopausal bleeding.  Patient not on tamoxifen. EXAM: TRANSABDOMINAL AND TRANSVAGINAL ULTRASOUND OF PELVIS TECHNIQUE: Both transabdominal and transvaginal ultrasound examinations of the pelvis were performed. Transabdominal technique was performed for global imaging of the pelvis including uterus, ovaries, adnexal regions, and pelvic cul-de-sac. It was necessary to proceed with endovaginal exam  following the transabdominal exam to visualize the uterus and ovaries. COMPARISON:  None FINDINGS: The study was limited by body habitus. Uterus Measurements: 8.4 x 4.2 x 4.7 cm. The uterus is anteverted in appearance. No fibroids or other mass visualized. Endometrium The margins of the endometrial cavity are not well visualized and inhomogeneous. What appears to be the endometrial stripe is 15 mm in thickness. Right ovary No adnexal mass.  The right ovary was not visualized. Left ovary No adnexal mass.  The left ovary was not visualized. Other findings No abnormal free fluid. IMPRESSION: Neither ovary was visualized.  No adnexal mass however is seen. Inhomogeneous appearance of the endometrial stripe. What appears to be the endometrial lining measures 15 mm in thickness which is above normal for age. Differential considerations for this appearance would include neoplasia, hyperplasia or polyp. Electronically Signed   By: Ashley Royalty M.D.   On: 05/26/2016 02:35   US Pelvis Complete  Result Date: 05/26/2016 CLINICAL DATA:  Postmenopausal bleeding.  Patient not on tamoxifen. EXAM: TRANSABDOMINAL AND TRANSVAGINAL ULTRASOUND OF PELVIS TECHNIQUE: Both transabdominal and transvaginal ultrasound examinations of the pelvis were performed. Transabdominal technique was performed for global imaging of the pelvis including uterus, ovaries, adnexal regions, and pelvic cul-de-sac. It was necessary to proceed with endovaginal exam following the transabdominal exam to visualize the uterus and ovaries. COMPARISON:  None FINDINGS: The study was limited by body habitus. Uterus Measurements: 8.4 x 4.2  x 4.7 cm. The uterus is anteverted in appearance. No fibroids or other mass visualized. Endometrium The margins of the endometrial cavity are not well visualized and inhomogeneous. What appears to be the endometrial stripe is 15 mm in thickness. Right ovary No adnexal mass.  The right ovary was not visualized. Left ovary No adnexal  mass.  The left ovary was not visualized. Other findings No abnormal free fluid. IMPRESSION: Neither ovary was visualized.  No adnexal mass however is seen. Inhomogeneous appearance of the endometrial stripe. What appears to be the endometrial lining measures 15 mm in thickness which is above normal for age. Differential considerations for this appearance would include neoplasia, hyperplasia or polyp. Electronically Signed   By: Ashley Royalty M.D.   On: 05/26/2016 02:35   US Abdomen Limited Ruq  Result Date: 05/26/2016 CLINICAL DATA:  Right upper quadrant pain. EXAM: US ABDOMEN LIMITED - RIGHT UPPER QUADRANT COMPARISON:  None. FINDINGS: Gallbladder: Physiologically distended. Multiple gallstones largest measuring 5 mm. No gallbladder wall thickening, wall thickness of 2.5 mm. No pericholecystic fluid. No sonographic Jenson sign noted by sonographer. Common bile duct: Diameter: 3.6 mm, normal. Liver: No focal lesion identified. Mild diffuse increase in parenchymal echogenicity. Normal directional flow in the main portal vein. IMPRESSION: 1. Cholelithiasis without sonographic findings of acute cholecystitis. 2. Mild hepatic steatosis. Electronically Signed   By: Jeb Levering M.D.   On: 05/26/2016 01:40    Review of Systems  Constitutional: Negative for chills and fever.  HENT: Negative.   Eyes: Negative.   Respiratory: Negative.   Cardiovascular: Negative.   Gastrointestinal: Negative for nausea and vomiting.  Genitourinary: Negative.   Musculoskeletal: Negative.   Skin:       Relates recent surgery on her left foot with sutures still in place.  Neurological: Negative.   Endo/Heme/Allergies: Negative.   Psychiatric/Behavioral: Negative.    Blood pressure (!) 118/59, pulse 73, temperature 97.7 F (36.5 C), temperature source Oral, resp. rate 20, height _0  (1.6 m), weight 136.1 kg (300 lb), SpO2 100 %. Physical Exam  Cardiovascular:  DP and PT pulses are palpable.  Musculoskeletal:   Range of motion within normal limits.  Neurological:  Sensation grossly intact  Skin:  Plantar incision on the left foot in the first interspace is healing uneventfully and well coapted. There is a full-thickness open wound on the dorsal aspect of the second toe with intact sutures and healing incisions proximal and distal. Serous drainage with no purulence. No cellulitis.    Assessment/Plan: Assessment: Stable status post I&D with removal of foreign body left forefoot  Plan: Betadine and a sterile dressing reapplied to the left foot. This point we will leave the sutures in for a little longer. Recommend follow-up this coming week with Dr. Elvina Mattes outpatient.  Durward Fortes 05/26/2016, 12:33 PM

## 2016-05-26 NOTE — H&P (Signed)
Laura Kline is an 61 y.o. female.   Chief Complaint: Vaginal bleeding HPI: The patient with past medical history of coronary artery disease, diabetes and hypertension presents to the emergency department with vaginal bleeding. She has been at a rehabilitation facility following a recent admission for cellulitis of her left foot. The facility staff noticed some vaginal bleeding during bedding changes and felt like she may have a fever. Upon arrival to the hospital the patient was afebrile. She denies pain, nausea or vomiting. Laboratory evaluation was significant for transaminitis, acute kidney injury, hyperglycemia and hyperbilirubinemia. Vaginal ultrasound revealed increased endometrial stripe. Ultrasound of her right upper quadrant showed gallstones and mild steatosis. Hemoglobin within normal limits. Due to postmenopausal bleeding as well as electrolyte abnormalities the emergency department staff called the hospitalist service for admission.  Past Medical History:  Diagnosis Date  . Coronary atherosclerosis    LAD calcifications noted on CT scan in 2016 with negative nuclear stress test.  . DKA (diabetic ketoacidoses) ([place])    a. 05/2009  . Essential hypertension   . History of Bell's palsy   . Hyperlipidemia   . Hypertension   . Hypokalemia   . IDDM (insulin dependent diabetes mellitus) ([place])   . Long-term insulin use ([place])   . Morbid obesity ([place])   . Peripheral vascular disease [facility])     Past Surgical History:  Procedure Laterality Date  . CATARACT EXTRACTION W/PHACO Left 02/08/2015   Procedure: CATARACT EXTRACTION PHACO AND INTRAOCULAR LENS PLACEMENT (IOC);  Surgeon: Birder Robson, MD;  Location: [facility] ORS;  Service: Ophthalmology;  Laterality: Left;  [place] 01:33 AP% 15.9 CDE 14.94 flyuid pack lot # [id number]  . CESAREAN SECTION     x2  . EYE SURGERY Right    Cataract Extraction with IOL  . FOREIGN BODY REMOVAL Left 05/13/2016   Procedure: REMOVAL FOREIGN BODY EXTREMITY;   Surgeon: Albertine Patricia, DPM;  Location: [facility] ORS;  Service: Podiatry;  Laterality: Left;  . INCISION AND DRAINAGE Left 05/13/2016   Procedure: INCISION AND DRAINAGE;  Surgeon: Albertine Patricia, DPM;  Location: [facility] ORS;  Service: Podiatry;  Laterality: Left;    Family History  Problem Relation Age of Onset  . Hypertension Mother   . Hypertension Father   . CAD Father        s/p cabg  . Heart attack Father   . Heart failure Father    Social History:  reports that she has never smoked. She has never used smokeless tobacco. She reports that she does not drink alcohol or use drugs.  Allergies:  Allergies  Allergen Reactions  . Rofecoxib Other (See Comments)    Reaction: unknown  . Penicillins Rash and Other (See Comments)    Has patient had a PCN reaction causing immediate rash, facial/tongue/throat swelling, SOB or lightheadedness with hypotension: Unknown Has patient had a PCN reaction causing severe rash involving mucus membranes or skin necrosis: Unknown Has patient had a PCN reaction that required hospitalization: Unknown Has patient had a PCN reaction occurring within the last 10 years: Unknown If all of the above answers are "NO", then may proceed with Cephalosporin use.     Prior to Admission medications   Medication Sig Start Date End Date Taking? Authorizing Provider  apixaban (ELIQUIS) 5 MG TABS tablet Take 1 tablet (5 mg total) by mouth 2 (two) times daily. 05/18/16  Yes Gouru, Illene Silver, MD  clindamycin (CLEOCIN) 300 MG capsule Take 300 mg by mouth 4 (four) times daily. 05/22/16 06/04/16 Yes [provider]  digoxin (LANOXIN) 0.25 MG tablet Take 1 tablet (0.25 mg total) by mouth daily. 05/19/16  Yes Gouru, Illene Silver, MD  diltiazem (CARDIZEM CD) 180 MG 24 hr capsule Take 1 capsule (180 mg total) by mouth daily. 05/19/16  Yes Gouru, Illene Silver, MD  furosemide (LASIX) 40 MG tablet Take 1 tablet (40 mg total) by mouth daily. 05/19/16  Yes Gouru, Illene Silver, MD  HYDROcodone-acetaminophen  (NORCO) 7.5-325 MG tablet Take 1 tablet by mouth every 6 (six) hours as needed for moderate pain. 05/18/16  Yes Gouru, Illene Silver, MD  Insulin Glargine (LANTUS SOLOSTAR) 100 UNIT/ML Solostar Pen Inject 58 Units into the skin at bedtime.    Yes [provider]  levofloxacin (LEVAQUIN) 500 MG tablet Take 500 mg by mouth daily. 05/22/16 05/28/16 Yes [provider]  loperamide (IMODIUM A-D) 2 MG tablet Take 2 mg by mouth every 8 (eight) hours as needed for diarrhea or loose stools.   Yes [provider]  metFORMIN (GLUCOPHAGE-XR) 500 MG 24 hr tablet Take 2 tablets by mouth daily with supper.    Yes [provider]  metoprolol (LOPRESSOR) 50 MG tablet Take 1 tablet (50 mg total) by mouth 2 (two) times daily. 10/07/14  Yes Wellington Hampshire, MD  nystatin (MYCOSTATIN/NYSTOP) powder Apply 1 g topically as needed (for rash and redness).   Yes [provider]  nystatin (MYCOSTATIN/NYSTOP) powder Apply 1 g topically 2 (two) times daily. 05/22/16 05/31/16 Yes [provider]  ondansetron (ZOFRAN) 4 MG tablet Take 1 tablet (4 mg total) by mouth every 6 (six) hours as needed for nausea. 05/18/16  Yes Gouru, Illene Silver, MD  rosuvastatin (CRESTOR) 10 MG tablet Take 1 tablet (10 mg total) by mouth daily after supper. 10/07/14  Yes Wellington Hampshire, MD  senna-docusate (SENOKOT-S) 8.6-50 MG tablet Take 1 tablet by mouth 2 (two) times daily. 05/18/16  Yes Gouru, Illene Silver, MD     Results for orders placed or performed during the hospital encounter of 05/25/16 (from the past 48 hour(s))  Comprehensive metabolic panel     Status: Abnormal   Collection Time: 05/25/16 10:52 PM  Result Value Ref Range   Sodium 133 (L) 135 - 145 mmol/L   Potassium 3.5 3.5 - 5.1 mmol/L   Chloride 97 (L) 101 - 111 mmol/L   CO2 22 22 - 32 mmol/L   Glucose, Bld 305 (H) 65 - 99 mg/dL   BUN 114 (H) 6 - 20 mg/dL    Comment: RESULT CONFIRMED BY MANUAL DILUTION.MSS   Creatinine, Ser 2.67 (H) 0.44 - 1.00 mg/dL    Calcium 7.4 (L) 8.9 - 10.3 mg/dL   Total Protein 5.7 (L) 6.5 - 8.1 g/dL   Albumin 2.2 (L) 3.5 - 5.0 g/dL   AST 140 (H) 15 - 41 U/L   ALT 208 (H) 14 - 54 U/L   Alkaline Phosphatase 139 (H) 38 - 126 U/L   Total Bilirubin 1.5 (H) 0.3 - 1.2 mg/dL   GFR calc non Af Amer 18 (L) >60 mL/min   GFR calc Af Amer 21 (L) >60 mL/min    Comment: (NOTE) The eGFR has been calculated using the CKD EPI equation. This calculation has not been validated in all clinical situations. eGFR's persistently <60 mL/min signify possible Chronic Kidney Disease.    Anion gap 14 5 - 15  Lactic acid, plasma     Status: None   Collection Time: 05/25/16 10:52 PM  Result Value Ref Range   Lactic Acid, Venous 1.6 0.5 - 1.9 mmol/L  CBC with Differential     Status: Abnormal   Collection Time: 05/25/16 10:52 PM  Result Value Ref Range   WBC 3.6 3.6 - 11.0 K/uL   RBC 3.78 (L) 3.80 - 5.20 MIL/uL   Hemoglobin 12.0 12.0 - 16.0 g/dL   HCT 35.1 35.0 - 47.0 %   MCV 92.8 80.0 - 100.0 fL   MCH 31.7 26.0 - 34.0 pg   MCHC 34.2 32.0 - 36.0 g/dL   RDW 13.1 11.5 - 14.5 %   Platelets 101 (L) 150 - 440 K/uL   Neutrophils Relative % 59 %   Neutro Abs 2.1 1.4 - 6.5 K/uL   Lymphocytes Relative 25 %   Lymphs Abs 0.9 (L) 1.0 - 3.6 K/uL   Monocytes Relative 11 %   Monocytes Absolute 0.4 0.2 - 0.9 K/uL   Eosinophils Relative 5 %   Eosinophils Absolute 0.2 0 - 0.7 K/uL   Basophils Relative 0 %   Basophils Absolute 0.0 0 - 0.1 K/uL  Urinalysis, Complete w Microscopic     Status: Abnormal   Collection Time: 05/25/16 10:52 PM  Result Value Ref Range   Color, Urine AMBER (A) YELLOW    Comment: BIOCHEMICALS MAY BE AFFECTED BY COLOR   APPearance CLOUDY (A) CLEAR   Specific Gravity, Urine 1.017 1.005 - 1.030   pH 5.0 5.0 - 8.0   Glucose, UA 50 (A) NEGATIVE mg/dL   Hgb urine dipstick NEGATIVE NEGATIVE   Bilirubin Urine NEGATIVE NEGATIVE   Ketones, ur NEGATIVE NEGATIVE mg/dL   Protein, ur NEGATIVE NEGATIVE mg/dL   Nitrite NEGATIVE  NEGATIVE   Leukocytes, UA NEGATIVE NEGATIVE   RBC / HPF 0-5 0 - 5 RBC/hpf   WBC, UA 6-30 0 - 5 WBC/hpf   Bacteria, UA RARE (A) NONE SEEN   Squamous Epithelial / LPF 0-5 (A) NONE SEEN   Mucous PRESENT    Hyaline Casts, UA PRESENT    Granular Casts, UA PRESENT    Amorphous Crystal PRESENT    US Transvaginal Non-ob  Result Date: 05/26/2016 CLINICAL DATA:  Postmenopausal bleeding.  Patient not on tamoxifen. EXAM: TRANSABDOMINAL AND TRANSVAGINAL ULTRASOUND OF PELVIS TECHNIQUE: Both transabdominal and transvaginal ultrasound examinations of the pelvis were performed. Transabdominal technique was performed for global imaging of the pelvis including uterus, ovaries, adnexal regions, and pelvic cul-de-sac. It was necessary to proceed with endovaginal exam following the transabdominal exam to visualize the uterus and ovaries. COMPARISON:  None FINDINGS: The study was limited by body habitus. Uterus Measurements: 8.4 x 4.2 x 4.7 cm. The uterus is anteverted in appearance. No fibroids or other mass visualized. Endometrium The margins of the endometrial cavity are not well visualized and inhomogeneous. What appears to be the endometrial stripe is 15 mm in thickness. Right ovary No adnexal mass.  The right ovary was not visualized. Left ovary No adnexal mass.  The left ovary was not visualized. Other findings No abnormal free fluid. IMPRESSION: Neither ovary was visualized.  No adnexal mass however is seen. Inhomogeneous appearance of the endometrial stripe. What appears to be the endometrial lining measures 15 mm in thickness which is above normal for age. Differential considerations for this appearance would include neoplasia, hyperplasia or polyp. Electronically Signed   By: Ashley Royalty M.D.   On: 05/26/2016 02:35   US Pelvis Complete  Result Date: 05/26/2016 CLINICAL DATA:  Postmenopausal bleeding.  Patient not on tamoxifen. EXAM: TRANSABDOMINAL AND TRANSVAGINAL ULTRASOUND OF PELVIS TECHNIQUE: Both  transabdominal and transvaginal ultrasound examinations of the  pelvis were performed. Transabdominal technique was performed for global imaging of the pelvis including uterus, ovaries, adnexal regions, and pelvic cul-de-sac. It was necessary to proceed with endovaginal exam following the transabdominal exam to visualize the uterus and ovaries. COMPARISON:  None FINDINGS: The study was limited by body habitus. Uterus Measurements: 8.4 x 4.2 x 4.7 cm. The uterus is anteverted in appearance. No fibroids or other mass visualized. Endometrium The margins of the endometrial cavity are not well visualized and inhomogeneous. What appears to be the endometrial stripe is 15 mm in thickness. Right ovary No adnexal mass.  The right ovary was not visualized. Left ovary No adnexal mass.  The left ovary was not visualized. Other findings No abnormal free fluid. IMPRESSION: Neither ovary was visualized.  No adnexal mass however is seen. Inhomogeneous appearance of the endometrial stripe. What appears to be the endometrial lining measures 15 mm in thickness which is above normal for age. Differential considerations for this appearance would include neoplasia, hyperplasia or polyp. Electronically Signed   By: Ashley Royalty M.D.   On: 05/26/2016 02:35   US Abdomen Limited Ruq  Result Date: 05/26/2016 CLINICAL DATA:  Right upper quadrant pain. EXAM: US ABDOMEN LIMITED - RIGHT UPPER QUADRANT COMPARISON:  None. FINDINGS: Gallbladder: Physiologically distended. Multiple gallstones largest measuring 5 mm. No gallbladder wall thickening, wall thickness of 2.5 mm. No pericholecystic fluid. No sonographic Rutkowski sign noted by sonographer. Common bile duct: Diameter: 3.6 mm, normal. Liver: No focal lesion identified. Mild diffuse increase in parenchymal echogenicity. Normal directional flow in the main portal vein. IMPRESSION: 1. Cholelithiasis without sonographic findings of acute cholecystitis. 2. Mild hepatic steatosis. Electronically  Signed   By: Jeb Levering M.D.   On: 05/26/2016 01:40    Review of Systems  Constitutional: Negative for chills and fever.  HENT: Negative for sore throat and tinnitus.   Eyes: Negative for blurred vision and redness.  Respiratory: Negative for cough and shortness of breath.   Cardiovascular: Negative for chest pain, palpitations, orthopnea and PND.  Gastrointestinal: Negative for abdominal pain, diarrhea, nausea and vomiting.  Genitourinary: Negative for dysuria, frequency and urgency.       Vaginal bleeding  Musculoskeletal: Negative for joint pain and myalgias.  Skin: Negative for rash.       No lesions  Neurological: Negative for speech change, focal weakness and weakness.  Endo/Heme/Allergies: Does not bruise/bleed easily.       No temperature intolerance  Psychiatric/Behavioral: Negative for depression and suicidal ideas.    Blood pressure (!) 98/38, pulse 61, temperature 97.7 F (36.5 C), temperature source Oral, resp. rate (!) 23, height 5' 3"  (1.6 m), weight 136.1 kg (300 lb), SpO2 98 %. Physical Exam  Nursing note and vitals reviewed. Constitutional: She is oriented to person, place, and time. She appears well-developed and well-nourished. No distress.  HENT:  Head: Normocephalic and atraumatic.  Mouth/Throat: Oropharynx is clear and moist.  Eyes: Conjunctivae and EOM are normal. Pupils are equal, round, and reactive to light. No scleral icterus.  Neck: Normal range of motion. Neck supple. No JVD present. No tracheal deviation present. No thyromegaly present.  Cardiovascular: Normal rate, regular rhythm and normal heart sounds.  Exam reveals no gallop and no friction rub.   No murmur heard. Respiratory: Effort normal and breath sounds normal.  GI: Soft. Bowel sounds are normal. She exhibits no distension. There is no tenderness.  Genitourinary:  Genitourinary Comments: Deferred  Lymphadenopathy:    She has no cervical adenopathy.  Neurological: She  is alert and  oriented to person, place, and time. No cranial nerve deficit. She exhibits normal muscle tone.  Skin: Skin is warm and dry. No rash noted. No erythema.  The patient appears very flushed from the shoulders up. Faint telangiectasias seen on face  Psychiatric: She has a normal mood and affect. Her behavior is normal. Judgment and thought content normal.     Assessment/Plan This is a 61 year-old female admitted for acute kidney injury and vaginal bleeding. 1. AKI: Secondary to dehydration and/or bleeding. Avoid nephrotoxic agents. Hydrate with intravenous fluid. 2. Vaginal bleeding: Will need biopsy of endometrium. Concerning for endometrial cancer. I have ordered a CA 125. OB/GYN consult pending  3. Transaminitis: possibly due to hepatic metastases. Will need staging scans with contrast after kidney function improves. Flushing likely secondary to estrogen affect of malignancy. 4. Diabetes mellitus type 2: Continue basal insulin adjusted for hospital diet. Sliding-scale insulin while hospitalized. I have held metformin 5. Atrial fibrillation: Rate controlled; continue diltiazem and digoxin. I have stopped the patient's Eliquis in anticipation of biopsy. 6. CAD: Stable; continue to monitor telemetry 7. Hypertension: Controlled; continue metoprolol 8. Hyperlipidemia: Continue statin therapy 9. Cellulitis: Interdigital space left first and second toes clean and dressed. No osteomyelitis seen on MRI this month. Continue Clinda and Levaquin. Wound consult as needed 10. DVT prophylaxis: Heparin 11. GI prophylaxis: None The patient is a FULL CODE. Time spent on admission orders and patient care approximately 45 minutes.  Harrie Foreman, MD 05/26/2016, 4:56 AM

## 2016-05-26 NOTE — ED Notes (Signed)
Pt repositioned in bed for comfort.

## 2016-05-26 NOTE — ED Notes (Signed)
Pt requesting medication for back pain. md notified, order for percocet received.

## 2016-05-27 LAB — GLUCOSE, CAPILLARY
Glucose-Capillary: 211 mg/dL — ABNORMAL HIGH (ref 65–99)
Glucose-Capillary: 204 mg/dL — ABNORMAL HIGH (ref 65–99)
Glucose-Capillary: 202 mg/dL — ABNORMAL HIGH (ref 65–99)
Glucose-Capillary: 189 mg/dL — ABNORMAL HIGH (ref 65–99)

## 2016-05-27 LAB — CBC
HEMATOCRIT: 34.9 % — AB (ref 35.0–47.0)
HEMOGLOBIN: 11.7 g/dL — AB (ref 12.0–16.0)
MCH: 31.2 pg (ref 26.0–34.0)
MCHC: 33.6 g/dL (ref 32.0–36.0)
MCV: 92.9 fL (ref 80.0–100.0)
Platelets: 109 10*3/uL — ABNORMAL LOW (ref 150–440)
RBC: 3.76 MIL/uL — AB (ref 3.80–5.20)
RDW: 13.4 % (ref 11.5–14.5)
WBC: 2.9 10*3/uL — ABNORMAL LOW (ref 3.6–11.0)

## 2016-05-27 LAB — HIV ANTIBODY (ROUTINE TESTING W REFLEX): HIV Screen 4th Generation wRfx: NONREACTIVE

## 2016-05-27 LAB — BASIC METABOLIC PANEL
Anion gap: 8 (ref 5–15)
BUN: 77 mg/dL — AB (ref 6–20)
CHLORIDE: 112 mmol/L — AB (ref 101–111)
CO2: 19 mmol/L — AB (ref 22–32)
CREATININE: 1.55 mg/dL — AB (ref 0.44–1.00)
Calcium: 7 mg/dL — ABNORMAL LOW (ref 8.9–10.3)
GFR calc non Af Amer: 35 mL/min — ABNORMAL LOW (ref >60)
GFR, EST AFRICAN AMERICAN: 40 mL/min — AB (ref >60)
Glucose, Bld: 240 mg/dL — ABNORMAL HIGH (ref 65–99)
Potassium: 3.2 mmol/L — ABNORMAL LOW (ref 3.5–5.1)
SODIUM: 139 mmol/L (ref 135–145)

## 2016-05-27 LAB — HEPATIC FUNCTION PANEL
ALT: 89 U/L — ABNORMAL HIGH (ref 14–54)
AST: 29 U/L (ref 15–41)
Albumin: 1.9 g/dL — ABNORMAL LOW (ref 3.5–5.0)
Alkaline Phosphatase: 121 U/L (ref 38–126)
BILIRUBIN DIRECT: 0.7 mg/dL — AB (ref 0.1–0.5)
BILIRUBIN INDIRECT: 0.7 mg/dL (ref 0.3–0.9)
BILIRUBIN TOTAL: 1.4 mg/dL — AB (ref 0.3–1.2)
TOTAL PROTEIN: 4.7 g/dL — AB (ref 6.5–8.1)

## 2016-05-27 LAB — CA 125: CA 125: 93.3 U/mL — ABNORMAL HIGH (ref 0.0–38.1)

## 2016-05-27 MED ORDER — INSULIN GLARGINE 100 UNIT/ML ~~LOC~~ SOLN
40.0000 [IU] | Freq: Every day | SUBCUTANEOUS | Status: DC
  Administered 2016-05-27: 22:00:00 40 [IU] via SUBCUTANEOUS
  Filled 2016-05-27 (×2): qty 0.4

## 2016-05-27 MED ORDER — LEVOFLOXACIN 500 MG PO TABS
500.0000 mg | ORAL_TABLET | Freq: Every day | ORAL | Status: DC
  Administered 2016-05-27 – 2016-05-28 (×2): 500 mg via ORAL
  Filled 2016-05-27: qty 1

## 2016-05-27 MED ORDER — POTASSIUM CHLORIDE IN NACL 40-0.9 MEQ/L-% IV SOLN
INTRAVENOUS | Status: AC
  Administered 2016-05-27: 75 mL/h via INTRAVENOUS
  Filled 2016-05-27: qty 1000

## 2016-05-27 NOTE — Progress Notes (Signed)
Pharmacist-Provider Communication   Patients CrCl 4851ml/min. Will increase levofloxacin to 500mg  Q24hr as patients serum creatinine has gone from 2.67 to 1.55. Will continue to monitor closely.   Delsa BernKelly m Fuhrmann, PharmD 9:34 AM 05/27/2016

## 2016-05-27 NOTE — Progress Notes (Signed)
[facility] [place] [facility]   PATIENT NAME: Laura Kline    MR#:  [id number]  DATE OF BIRTH:  [birth year]  SUBJECTIVE:   Came in with vaginal bleeding that was noticed by staff at nursing home. Patient denies any vaginal discharge before. She is postmenopausal since 2011 Denies any other complaints. She feels weak. Vaginal bleeding slowed down Runny BM's  REVIEW OF SYSTEMS:   Review of Systems  Constitutional: Negative for chills, fever and weight loss.  HENT: Negative for ear discharge, ear pain and nosebleeds.   Eyes: Negative for blurred vision, pain and discharge.  Respiratory: Negative for sputum production, shortness of breath, wheezing and stridor.   Cardiovascular: Negative for chest pain, palpitations, orthopnea and PND.  Gastrointestinal: Negative for abdominal pain, diarrhea, nausea and vomiting.  Genitourinary: Negative for frequency and urgency.       Vaginal bleeding+  Musculoskeletal: Negative for back pain and joint pain.  Neurological: Positive for weakness. Negative for sensory change, speech change and focal weakness.  Psychiatric/Behavioral: Negative for depression and hallucinations. The patient is not nervous/anxious.     Tolerating Diet:yes Tolerating PT: pending  DRUG ALLERGIES:   Allergies  Allergen Reactions  . Rofecoxib Other (See Comments)    Reaction: unknown  . Penicillins Rash and Other (See Comments)    Has patient had a PCN reaction causing immediate rash, facial/tongue/throat swelling, SOB or lightheadedness with hypotension: Unknown Has patient had a PCN reaction causing severe rash involving mucus membranes or skin necrosis: Unknown Has patient had a PCN reaction that required hospitalization: Unknown Has patient had a PCN reaction occurring within the last 10 years: Unknown If all of the above answers are "NO", then may proceed with Cephalosporin use.     VITALS:  Blood pressure (!) 106/58, pulse 80,  temperature 98.1 F (36.7 C), temperature source Axillary, resp. rate 16, height 5\' 3"  (1.6 m), weight 136.1 kg (300 lb), SpO2 100 %.  PHYSICAL EXAMINATION:   Physical Exam  GENERAL:  61 y.o.-year-old patient lying in the bed with no acute distress. Morbidly obese appears chronically ill EYES: Pupils equal, round, reactive to light and accommodation. No scleral icterus. Extraocular muscles intact.  HEENT: Head atraumatic, normocephalic. Oropharynx and nasopharynx clear.  NECK:  Supple, no jugular venous distention. No thyroid enlargement, no tenderness.  LUNGS: Normal breath sounds bilaterally, no wheezing, rales, rhonchi. No use of accessory muscles of respiration.  CARDIOVASCULAR: S1, S2 normal. No murmurs, rubs, or gallops.  ABDOMEN: Soft, nontender, nondistended. Bowel sounds present. No organomegaly or mass.  EXTREMITIES: Bilateral lower extremity pitting edema 2+. Left second toe sutures present from previous surgery is her ulcer present on the dorsum of the foot in the bed greater and second metatarsal  NEUROLOGIC: Cranial nerves II through XII are intact. No focal Motor or sensory deficits b/l.   PSYCHIATRIC:  patient is alert and oriented x 3.  SKIN: No obvious rash, lesion, or ulcer.   LABORATORY PANEL:  CBC  Recent Labs Lab 05/27/16 0643  WBC 2.9*  HGB 11.7*  HCT 34.9*  PLT 109*    Chemistries   Recent Labs Lab 05/25/16 2252  NA 133*  K 3.5  CL 97*  CO2 22  GLUCOSE 305*  BUN 114*  CREATININE 2.67*  CALCIUM 7.4*  AST 140*  ALT 208*  ALKPHOS 139*  BILITOT 1.5*   Cardiac Enzymes No results for input(s): TROPONINI in the last 168 hours. RADIOLOGY:  US Transvaginal Non-ob  Result Date: 05/26/2016  CLINICAL DATA:  Postmenopausal bleeding.  Patient not on tamoxifen. EXAM: TRANSABDOMINAL AND TRANSVAGINAL ULTRASOUND OF PELVIS TECHNIQUE: Both transabdominal and transvaginal ultrasound examinations of the pelvis were performed. Transabdominal technique was  performed for global imaging of the pelvis including uterus, ovaries, adnexal regions, and pelvic cul-de-sac. It was necessary to proceed with endovaginal exam following the transabdominal exam to visualize the uterus and ovaries. COMPARISON:  None FINDINGS: The study was limited by body habitus. Uterus Measurements: 8.4 x 4.2 x 4.7 cm. The uterus is anteverted in appearance. No fibroids or other mass visualized. Endometrium The margins of the endometrial cavity are not well visualized and inhomogeneous. What appears to be the endometrial stripe is 15 mm in thickness. Right ovary No adnexal mass.  The right ovary was not visualized. Left ovary No adnexal mass.  The left ovary was not visualized. Other findings No abnormal free fluid. IMPRESSION: Neither ovary was visualized.  No adnexal mass however is seen. Inhomogeneous appearance of the endometrial stripe. What appears to be the endometrial lining measures 15 mm in thickness which is above normal for age. Differential considerations for this appearance would include neoplasia, hyperplasia or polyp. Electronically Signed   By: Tollie Eth M.D.   On: 05/26/2016 02:35   US Pelvis Complete  Result Date: 05/26/2016 CLINICAL DATA:  Postmenopausal bleeding.  Patient not on tamoxifen. EXAM: TRANSABDOMINAL AND TRANSVAGINAL ULTRASOUND OF PELVIS TECHNIQUE: Both transabdominal and transvaginal ultrasound examinations of the pelvis were performed. Transabdominal technique was performed for global imaging of the pelvis including uterus, ovaries, adnexal regions, and pelvic cul-de-sac. It was necessary to proceed with endovaginal exam following the transabdominal exam to visualize the uterus and ovaries. COMPARISON:  None FINDINGS: The study was limited by body habitus. Uterus Measurements: 8.4 x 4.2 x 4.7 cm. The uterus is anteverted in appearance. No fibroids or other mass visualized. Endometrium The margins of the endometrial cavity are not well visualized and  inhomogeneous. What appears to be the endometrial stripe is 15 mm in thickness. Right ovary No adnexal mass.  The right ovary was not visualized. Left ovary No adnexal mass.  The left ovary was not visualized. Other findings No abnormal free fluid. IMPRESSION: Neither ovary was visualized.  No adnexal mass however is seen. Inhomogeneous appearance of the endometrial stripe. What appears to be the endometrial lining measures 15 mm in thickness which is above normal for age. Differential considerations for this appearance would include neoplasia, hyperplasia or polyp. Electronically Signed   By: Tollie Eth M.D.   On: 05/26/2016 02:35   US Abdomen Limited Ruq  Result Date: 05/26/2016 CLINICAL DATA:  Right upper quadrant pain. EXAM: US ABDOMEN LIMITED - RIGHT UPPER QUADRANT COMPARISON:  None. FINDINGS: Gallbladder: Physiologically distended. Multiple gallstones largest measuring 5 mm. No gallbladder wall thickening, wall thickness of 2.5 mm. No pericholecystic fluid. No sonographic Douthitt sign noted by sonographer. Common bile duct: Diameter: 3.6 mm, normal. Liver: No focal lesion identified. Mild diffuse increase in parenchymal echogenicity. Normal directional flow in the main portal vein. IMPRESSION: 1. Cholelithiasis without sonographic findings of acute cholecystitis. 2. Mild hepatic steatosis. Electronically Signed   By: Rubye Oaks M.D.   On: 05/26/2016 01:40   ASSESSMENT AND PLAN:   61 year-old female admitted for acute kidney injury and vaginal bleeding.  1. AKI: Secondary to dehydration The patient's baseline creatinine around 1.5. -Avoid nephrotoxic agents.  -Hydrate with intravenous fluid. -came in with creat 2.27---BMP  2. Vaginal bleeding: Patient is postmenopausal  -She was seen by  Dr. Logan BoresEvans from GYN recommends patient  Will need biopsy of endometrium. Biopsy will be done as outpatient -Pending CA 125.  -We'll set up biopsy as outpatient once patient is medically stable.  3.  Acute Transaminitis: Unclear etiology  -Ultrasound of the abdomen shows cholelithiasis. Patient is asymptomatic -Seen by Dr Lyman Bishopcooper----since pt asymptomatic no acute surgical indication  4. Diabetes mellitus type 2 - Continue basal insulin adjusted for hospital diet.  -held metformin -Insulin Lantus and SSI  5. Atrial fibrillation: Rate controlled; continue diltiazem and digoxin. -stopped the patient's Eliquis  due to vaginal bleeding. Patient will be started on oral progesterone by GYN and once bleeding stops will discuss with GYN if patient can be resumed back on oral anticoagulation   6. CAD: Stable  7. Hypertension: Controlled - continue metoprolol  8. Hyperlipidemia: Continue statin therapy  9. Left foot Cellulitis: Interdigital space left first and second toes clean and dressed. No osteomyelitis seen on MRI this month. - Continue Clinda and Levaquin.  -Patient had left foot surgery for foreign body and abscess about 10-12 days ago -appreciate Dr cline's input. Recommends dressing changes and out pt f/u  10. DVT prophylaxis: Heparin  CSW for d/c planning PT to see  Case discussed with Care Management/Social Worker. Management plans discussed with the patient, family and they are in agreement.  CODE STATUS: Full   TOTAL TIME TAKING CARE OF THIS PATIENT: *30* minutes.  >50% time spent on counselling and coordination of care patient and husband  POSSIBLE D/C IN 2-3 DAYS, DEPENDING ON CLINICAL CONDITION.  Note: This dictation was prepared with Dragon dictation along with smaller phrase technology. Any transcriptional errors that result from this process are unintentional.  Felica Chargois M.D on 05/27/2016 at 7:47 AM  Between 7am to 6pm - Pager - [phone]  After 6pm go to [email or web address] - password [email or web address]  [facility]  Office  [phone]  CC: Primary care physician; Patient, No Pcp Per

## 2016-05-27 NOTE — Evaluation (Signed)
Physical Therapy Evaluation Patient Details Name: Laura Kline MRN: [id number] DOB: 09/19/1955 Today's Date: 05/27/2016   History of Present Illness  61 y/o female who was here 2 weeks ago with with L foot cellulitis and had an I&D.  Was at rehab and unable to do much mobility, now here with AKI and vaginal bleeding.  Clinical Impression  Pt is quite limited with most mobility/exercises but most notedly with attempt at standing.  She was able to get to standing with great effort and much assist, but ultimately did not tolerate standing more than a few seconds and despite multiple subsequent attempts could not get back to standing (heel wedge on L, shoe on R).  She was also able to participate with some light supine exercises for LEs but is anxious and limited.  Pt very functionally limited, will need to return to rehab.    Follow Up Recommendations SNF    Equipment Recommendations       Recommendations for Other Services       Precautions / Restrictions Precautions Precautions: Fall Restrictions Weight Bearing Restrictions: Yes LLE Weight Bearing: Weight bearing as tolerated      Mobility  Bed Mobility Overal bed mobility: Needs Assistance Bed Mobility: Supine to Sit;Sit to Supine     Supine to sit: Mod assist Sit to supine: Max assist   General bed mobility comments: Pt very anxious with all aspects of PT/mobility, showed fair effort but needed heavy cuing/encouragement  Transfers Overall transfer level: Needs assistance Equipment used: Rolling walker (2 wheeled) Transfers: Sit to/from Stand Sit to Stand: Max assist         General transfer comment: Pt able to get to standing but only tolerated it for <5 seconds before becoming very anxions and essentially throwing herself back onto the bed.  Attempted 2 more times after rest breaks and verbal cuing, unable to get to standing.  Ambulation/Gait             General Gait Details: not appropriate at this  time  Stairs            Wheelchair Mobility    Modified Rankin (Stroke Patients Only)       Balance Overall balance assessment: Needs assistance Sitting-balance support: Bilateral upper extremity supported;Feet supported Sitting balance-Leahy Scale: Good     Standing balance support: Bilateral upper extremity supported Standing balance-Leahy Scale: Poor Standing balance comment: Pt lacking confidence and very unsafe                             Pertinent Vitals/Pain Pain Assessment: 0-10 Pain Score: 4     Home Living Family/patient expects to be discharged to:: Skilled nursing facility                      Prior Function Level of Independence: Independent with assistive device(s)         Comments: Pt has not done any real walking for at least 2 weeks, has been very limited at rehab.  Apparently was mobile previously with SPC.     Hand Dominance        Extremity/Trunk Assessment   Upper Extremity Assessment Upper Extremity Assessment: Generalized weakness    Lower Extremity Assessment Lower Extremity Assessment: Generalized weakness (grossly 3-/5, minimal AROM tolerance)       Communication   Communication: No difficulties  Cognition Arousal/Alertness: Awake/alert Behavior During Therapy: Anxious Overall Cognitive Status: Within Functional Limits for  tasks assessed                                        General Comments      Exercises General Exercises - Lower Extremity Ankle Circles/Pumps: AROM;5 reps Heel Slides: AROM;5 reps Hip ABduction/ADduction: AROM;5 reps   Assessment/Plan    PT Assessment Patient needs continued PT services  PT Problem List Decreased strength;Decreased mobility;Decreased knowledge of precautions;Decreased balance;Decreased knowledge of use of DME;Decreased activity tolerance;Pain;Decreased range of motion;Decreased safety awareness       PT Treatment Interventions DME  instruction;Therapeutic activities;Gait training;Therapeutic exercise;Patient/family education;Stair training;Balance training;Functional mobility training    PT Goals (Current goals can be found in the Care Plan section)  Acute Rehab PT Goals Patient Stated Goal: get back to walking PT Goal Formulation: With patient Time For Goal Achievement: 06/10/16 Potential to Achieve Goals: Fair    Frequency Min 2X/week   Barriers to discharge Decreased caregiver support      Co-evaluation               AM-PAC PT "6 Clicks" Daily Activity  Outcome Measure Difficulty turning over in bed (including adjusting bedclothes, sheets and blankets)?: A Lot Difficulty moving from lying on back to sitting on the side of the bed? : Total Difficulty sitting down on and standing up from a chair with arms (e.g., wheelchair, bedside commode, etc,.)?: Total Help needed moving to and from a bed to chair (including a wheelchair)?: Total Help needed walking in hospital room?: Total Help needed climbing 3-5 steps with a railing? : Total 6 Click Score: 7    End of Session Equipment Utilized During Treatment: Gait belt Activity Tolerance: Patient limited by fatigue Patient left: with bed alarm set;with call bell/phone within reach Nurse Communication: Mobility status PT Visit Diagnosis: Muscle weakness (generalized) (M62.81);Pain;Difficulty in walking, not elsewhere classified (R26.2)    Time: 4098-1191 PT Time Calculation (min) (ACUTE ONLY): 33 min   Charges:   PT Evaluation $PT Eval Low Complexity: 1 Procedure PT Treatments $Therapeutic Activity: 8-22 mins   PT G Codes:        Malachi Pro, DPT 05/27/2016, 12:42 PM

## 2016-05-28 LAB — BASIC METABOLIC PANEL
Anion gap: 8 (ref 5–15)
BUN: 67 mg/dL — ABNORMAL HIGH (ref 6–20)
CALCIUM: 7.2 mg/dL — AB (ref 8.9–10.3)
CO2: 18 mmol/L — ABNORMAL LOW (ref 22–32)
Chloride: 109 mmol/L (ref 101–111)
Creatinine, Ser: 1.41 mg/dL — ABNORMAL HIGH (ref 0.44–1.00)
GFR calc non Af Amer: 39 mL/min — ABNORMAL LOW (ref >60)
GFR, EST AFRICAN AMERICAN: 45 mL/min — AB (ref >60)
Glucose, Bld: 200 mg/dL — ABNORMAL HIGH (ref 65–99)
Potassium: 3.4 mmol/L — ABNORMAL LOW (ref 3.5–5.1)
SODIUM: 135 mmol/L (ref 135–145)

## 2016-05-28 LAB — GLUCOSE, CAPILLARY
Glucose-Capillary: 195 mg/dL — ABNORMAL HIGH (ref 65–99)
Glucose-Capillary: 161 mg/dL — ABNORMAL HIGH (ref 65–99)
Glucose-Capillary: 179 mg/dL — ABNORMAL HIGH (ref 65–99)

## 2016-05-28 MED ORDER — APIXABAN 5 MG PO TABS
5.0000 mg | ORAL_TABLET | Freq: Two times a day (BID) | ORAL | Status: DC
  Administered 2016-05-28: 5 mg via ORAL
  Filled 2016-05-28: qty 1

## 2016-05-28 MED ORDER — NORETHINDRONE ACETATE 5 MG PO TABS: 5.0000 mg | ORAL_TABLET | Freq: Three times a day (TID) | ORAL | 0 refills | Status: AC

## 2016-05-28 MED ORDER — INSULIN GLARGINE 100 UNIT/ML SOLOSTAR PEN: 50.0000 [IU] | PEN_INJECTOR | Freq: Every day | SUBCUTANEOUS | 11 refills | Status: AC

## 2016-05-28 MED ORDER — CLINDAMYCIN HCL 300 MG PO CAPS: 300.0000 mg | ORAL_CAPSULE | Freq: Four times a day (QID) | ORAL | 0 refills | Status: AC

## 2016-05-28 MED ORDER — LEVOFLOXACIN 500 MG PO TABS: 500.0000 mg | ORAL_TABLET | Freq: Every day | ORAL | 0 refills | Status: AC

## 2016-05-28 NOTE — Discharge Summary (Signed)
[facility] [place] [facility]   PATIENT NAME: Laura Kline    MR#:  [id number]  DATE OF BIRTH:  10-Jan-1955  DATE OF ADMISSION:  05/25/2016 ADMITTING PHYSICIAN: Arnaldo Natal, MD  DATE OF DISCHARGE: 05/28/16  PRIMARY CARE PHYSICIAN: Patient, No Pcp Per    ADMISSION DIAGNOSIS:  RUQ pain [R10.11] Postmenopausal vaginal bleeding [N95.0]  DISCHARGE DIAGNOSIS:  Acute renal failure due to pre-renal azotemia---improving Vaginal bleeding-resolved---further w/u as out pt Diarrhea-resolved Asymptomatic Cholelithiasis SECONDARY DIAGNOSIS:   Past Medical History:  Diagnosis Date  . Coronary atherosclerosis    LAD calcifications noted on CT scan in 2016 with negative nuclear stress test.  . DKA (diabetic ketoacidoses) (HCC)    a. 05/2009  . Essential hypertension   . History of Bell's palsy   . Hyperlipidemia   . Hypertension   . Hypokalemia   . IDDM (insulin dependent diabetes mellitus) (HCC)   . Long-term insulin use (HCC)   . Morbid obesity (HCC)   . Peripheral vascular disease [facility])     HOSPITAL COURSE:   61 year-old female admitted for acute kidney injury and vaginal bleeding.  1. JXB:JYNWGNFAO to dehydration The patient's baseline creatinine around 1.5. -Avoid nephrotoxic agents.  -Hydrate with intravenous fluid. -came in with creat 2.27---1.4  2. Vaginal bleeding: Patient is postmenopausal  -She was seen by Dr. Logan Bores from GYN recommends patient Will need biopsy of endometrium. Biopsy will be done as outpatient - CA 125 elevated at 93 -pt will see Dr Logan Bores  As outpt for uterine biopsy. Dr Logan Bores aware pt is back on eliquis -on po progesterone  3. Acute Transaminitis: Unclear etiology  -Ultrasound of the abdomen shows cholelithiasis. Patient is asymptomatic -Seen by Dr Lyman Bishop pt asymptomatic no acute surgical indication  4. Diabetes mellitus type 2 -Continue basal insulin adjusted for hospital diet.  -d/ced  metformin due to creat -Insulin Lantus and SSI  5. Atrial fibrillation: Rate controlled; continue diltiazem and digoxin. -stopped the patient's Eliquis due to vaginal bleeding.  -pt resumed back on oral anticoagulation today  6. ZHY:QMVHQI  7. Hypertension: Controlled - continue metoprolol  8. Hyperlipidemia: Continue statin therapy  9. Left foot Cellulitis: Interdigital space left first and second toes clean and dressed. No osteomyelitis seen on MRI this month. - Continue Clinda and Levaquin till 06/03/16 -Patient had left foot surgery for foreign body and abscess about 10-12 days ago -appreciate Dr cline's input. Recommends dressing changes and out pt f/u  10.DVT prophylaxis:eliquis  CSW for d/c planning to reab toda Mr Munnerlyn aware CONSULTS OBTAINED:  Treatment Team:  Linzie Collin, MD Linus Galas, DPM  DRUG ALLERGIES:   Allergies  Allergen Reactions  . Rofecoxib Other (See Comments)    Reaction: unknown  . Penicillins Rash and Other (See Comments)    Has patient had a PCN reaction causing immediate rash, facial/tongue/throat swelling, SOB or lightheadedness with hypotension: Unknown Has patient had a PCN reaction causing severe rash involving mucus membranes or skin necrosis: Unknown Has patient had a PCN reaction that required hospitalization: Unknown Has patient had a PCN reaction occurring within the last 10 years: Unknown If all of the above answers are "NO", then may proceed with Cephalosporin use.     DISCHARGE MEDICATIONS:   Current Discharge Medication List    START taking these medications   Details  norethindrone (AYGESTIN) 5 MG tablet Take 1 tablet (5 mg total) by mouth 3 (three) times daily. Qty: 30 tablet, Refills: 0  CONTINUE these medications which have CHANGED   Details  clindamycin (CLEOCIN) 300 MG capsule Take 1 capsule (300 mg total) by mouth 4 (four) times daily. Qty: 24 capsule, Refills: 0    Insulin Glargine  (LANTUS SOLOSTAR) 100 UNIT/ML Solostar Pen Inject 50 Units into the skin at bedtime. Qty: 15 mL, Refills: 11    levofloxacin (LEVAQUIN) 500 MG tablet Take 1 tablet (500 mg total) by mouth daily. Qty: 6 tablet, Refills: 0      CONTINUE these medications which have NOT CHANGED   Details  apixaban (ELIQUIS) 5 MG TABS tablet Take 1 tablet (5 mg total) by mouth 2 (two) times daily. Qty: 60 tablet, Refills: 0    digoxin (LANOXIN) 0.25 MG tablet Take 1 tablet (0.25 mg total) by mouth daily.    diltiazem (CARDIZEM CD) 180 MG 24 hr capsule Take 1 capsule (180 mg total) by mouth daily.    furosemide (LASIX) 40 MG tablet Take 1 tablet (40 mg total) by mouth daily. Qty: 30 tablet    HYDROcodone-acetaminophen (NORCO) 7.5-325 MG tablet Take 1 tablet by mouth every 6 (six) hours as needed for moderate pain. Qty: 30 tablet, Refills: 0    loperamide (IMODIUM A-D) 2 MG tablet Take 2 mg by mouth every 8 (eight) hours as needed for diarrhea or loose stools.    metoprolol (LOPRESSOR) 50 MG tablet Take 1 tablet (50 mg total) by mouth 2 (two) times daily. Qty: 180 tablet, Refills: 3    !! nystatin (MYCOSTATIN/NYSTOP) powder Apply 1 g topically as needed (for rash and redness).    !! nystatin (MYCOSTATIN/NYSTOP) powder Apply 1 g topically 2 (two) times daily.    ondansetron (ZOFRAN) 4 MG tablet Take 1 tablet (4 mg total) by mouth every 6 (six) hours as needed for nausea. Qty: 20 tablet, Refills: 0    rosuvastatin (CRESTOR) 10 MG tablet Take 1 tablet (10 mg total) by mouth daily after supper. Qty: 90 tablet, Refills: 3    senna-docusate (SENOKOT-S) 8.6-50 MG tablet Take 1 tablet by mouth 2 (two) times daily.     !! - Potential duplicate medications found. Please discuss with provider.    STOP taking these medications     metFORMIN (GLUCOPHAGE-XR) 500 MG 24 hr tablet         If you experience worsening of your admission symptoms, develop shortness of breath, life threatening emergency, suicidal  or homicidal thoughts you must seek medical attention immediately by calling 911 or calling your MD immediately  if symptoms less severe.  You Must read complete instructions/literature along with all the possible adverse reactions/side effects for all the Medicines you take and that have been prescribed to you. Take any new Medicines after you have completely understood and accept all the possible adverse reactions/side effects.   Please note  You were cared for by a hospitalist during your hospital stay. If you have any questions about your discharge medications or the care you received while you were in the hospital after you are discharged, you can call the unit and asked to speak with the hospitalist on call if the hospitalist that took care of you is not available. Once you are discharged, your primary care physician will handle any further medical issues. Please note that NO REFILLS for any discharge medications will be authorized once you are discharged, as it is imperative that you return to your primary care physician (or establish a relationship with a primary care physician if you do not have one) for  your aftercare needs so that they can reassess your need for medications and monitor your lab values. Today   SUBJECTIVE    Doing well VITAL SIGNS:  Blood pressure (!) 105/44, pulse 70, temperature 97.6 F (36.4 C), temperature source Oral, resp. rate 19, height 5\' 3"  (1.6 m), weight 134.4 kg (296 lb 6.4 oz), SpO2 99 %.  I/O:   Intake/Output Summary (Last 24 hours) at 05/28/16 [id number] Last data filed at 05/27/16 1800  Gross per 24 hour  Intake            887.5 ml  Output                0 ml  Net            887.5 ml    PHYSICAL EXAMINATION:  GENERAL:  61 y.o.-year-old patient lying in the bed with no acute distress. Morbid obesity EYES: Pupils equal, round, reactive to light and accommodation. No scleral icterus. Extraocular muscles intact.  HEENT: Head atraumatic, normocephalic.  Oropharynx and nasopharynx clear.  NECK:  Supple, no jugular venous distention. No thyroid enlargement, no tenderness.  LUNGS: Normal breath sounds bilaterally, no wheezing, rales,rhonchi or crepitation. No use of accessory muscles of respiration.  CARDIOVASCULAR: S1, S2 normal. No murmurs, rubs, or gallops.  ABDOMEN: Soft, non-tender, non-distended. Bowel sounds present. No organomegaly or mass.  EXTREMITIES: = pedal edema, nocyanosis, or clubbing. Left foot dressing  With sutures + NEUROLOGIC: Cranial nerves II through XII are intact. Muscle strength 4/5 in all extremities. Sensation intact. Gait not checked. deconditioned PSYCHIATRIC: The patient is alert and oriented x 3.  SKIN: No obvious rash, lesion, or ulcer.   DATA REVIEW:   CBC   Recent Labs Lab 05/27/16 0643  WBC 2.9*  HGB 11.7*  HCT 34.9*  PLT 109*    Chemistries   Recent Labs Lab 05/27/16 0643 05/28/16 0403  NA 139 135  K 3.2* 3.4*  CL 112* 109  CO2 19* 18*  GLUCOSE 240* 200*  BUN 77* 67*  CREATININE 1.55* 1.41*  CALCIUM 7.0* 7.2*  AST 29  --   ALT 89*  --   ALKPHOS 121  --   BILITOT 1.4*  --     Microbiology Results   Recent Results (from the past 240 hour(s))  MRSA PCR Screening     Status: None   Collection Time: 05/26/16  6:40 AM  Result Value Ref Range Status   MRSA by PCR NEGATIVE NEGATIVE Final    Comment:        The GeneXpert MRSA Assay (FDA approved for NASAL specimens only), is one component of a comprehensive MRSA colonization surveillance program. It is not intended to diagnose MRSA infection nor to guide or monitor treatment for MRSA infections.   C difficile quick scan w PCR reflex     Status: None   Collection Time: 05/26/16  1:30 PM  Result Value Ref Range Status   C Diff antigen NEGATIVE NEGATIVE Final   C Diff toxin NEGATIVE NEGATIVE Final   C Diff interpretation No C. difficile detected.  Final  Gastrointestinal Panel by PCR , Stool     Status: None   Collection Time:  05/26/16  6:19 PM  Result Value Ref Range Status   Campylobacter species NOT DETECTED NOT DETECTED Final   Plesimonas shigelloides NOT DETECTED NOT DETECTED Final   Salmonella species NOT DETECTED NOT DETECTED Final   Yersinia enterocolitica NOT DETECTED NOT DETECTED Final   Vibrio species NOT DETECTED NOT  DETECTED Final   Vibrio cholerae NOT DETECTED NOT DETECTED Final   Enteroaggregative E coli (EAEC) NOT DETECTED NOT DETECTED Final   Enteropathogenic E coli (EPEC) NOT DETECTED NOT DETECTED Final   Enterotoxigenic E coli (ETEC) NOT DETECTED NOT DETECTED Final   Shiga like toxin producing E coli (STEC) NOT DETECTED NOT DETECTED Final   Shigella/Enteroinvasive E coli (EIEC) NOT DETECTED NOT DETECTED Final   Cryptosporidium NOT DETECTED NOT DETECTED Final   Cyclospora cayetanensis NOT DETECTED NOT DETECTED Final   Entamoeba histolytica NOT DETECTED NOT DETECTED Final   Giardia lamblia NOT DETECTED NOT DETECTED Final   Adenovirus F40/41 NOT DETECTED NOT DETECTED Final   Astrovirus NOT DETECTED NOT DETECTED Final   Norovirus GI/GII NOT DETECTED NOT DETECTED Final   Rotavirus A NOT DETECTED NOT DETECTED Final   Sapovirus (I, II, IV, and V) NOT DETECTED NOT DETECTED Final    RADIOLOGY:  No results found.   Management plans discussed with the patient, family and they are in agreement.  CODE STATUS:     Code Status Orders        Start     Ordered   05/26/16 0538  Full code  Continuous     05/26/16 0537    Code Status History    Date Active Date Inactive Code Status Order ID Comments User Context   05/12/2016  3:28 PM 05/18/2016 10:20 PM Full Code [id number]  Houston Siren, MD Inpatient   07/26/2014 12:21 PM 07/28/2014  6:58 PM Full Code [id number]  Katha Hamming, MD ED      TOTAL TIME TAKING CARE OF THIS PATIENT: *40* minutes.    Shawnay Bramel M.D on 05/28/2016 at 8:22 AM  Between 7am to 6pm - Pager - [phone] After 6pm go to [email or web address] - password [email or web address]  [facility]  Office  [phone]  CC: Primary care physician; Patient, No Pcp Per

## 2016-05-28 NOTE — Plan of Care (Signed)
Pt d/ced back to [facility] - going to Room 1B.  Called report.  Pt hasn't had any vaginal bleeding last night or today.  Will f/u outpt for biopsy of endometrial lining.  Continues on norethindrone/aygestin tid.  Pt has had no c/o pain.  Has moisture associated skin damage under breasts and pannus - nystatin applied and used pillow cases for absorption.  Left foot has dressing which is clean/dry/intact. Pt seems depressed and relayed this to nurse at facility along with fact BPs have been low.  Pt continues on metoprolol and digoxin and dlitiazem for Afib.  She was started back on elequis today which she also takes for Afib. She will be transported via EMS which has also been called.

## 2016-05-28 NOTE — Discharge Instructions (Signed)
F/u with [facility] on your appt date as before

## 2016-05-28 NOTE — Clinical Social Work Note (Signed)
Pt is ready for discharge today and will return to [facility]. Pt and husband are aware and agreeable to discharge plan. Facility is ready to admit pt as they have recived discharge discharge information. Per facility a new [facility] is not needed. RN will call report. [facility] will [facility]. CSW is signing off as no further needs identified.   Dede QuerySarah Monigue Spraggins, MSW, LCSW  Clinical Social Worker  [phone]

## 2016-05-29 ENCOUNTER — Emergency Department: Payer: BLUE CROSS/BLUE SHIELD

## 2016-05-29 ENCOUNTER — Inpatient Hospital Stay: Payer: BLUE CROSS/BLUE SHIELD

## 2016-05-29 ENCOUNTER — Inpatient Hospital Stay
Admission: EM | Admit: 2016-05-29 | Discharge: 2016-06-08 | DRG: 870 | Disposition: E | Payer: BLUE CROSS/BLUE SHIELD | Attending: Internal Medicine | Admitting: Internal Medicine

## 2016-05-29 DIAGNOSIS — R34 Anuria and oliguria: Secondary | ICD-10-CM | POA: Diagnosis present

## 2016-05-29 DIAGNOSIS — N179 Acute kidney failure, unspecified: Secondary | ICD-10-CM | POA: Diagnosis not present

## 2016-05-29 DIAGNOSIS — Z794 Long term (current) use of insulin: Secondary | ICD-10-CM

## 2016-05-29 DIAGNOSIS — R58 Hemorrhage, not elsewhere classified: Secondary | ICD-10-CM | POA: Diagnosis not present

## 2016-05-29 DIAGNOSIS — E871 Hypo-osmolality and hyponatremia: Secondary | ICD-10-CM | POA: Diagnosis present

## 2016-05-29 DIAGNOSIS — R739 Hyperglycemia, unspecified: Secondary | ICD-10-CM

## 2016-05-29 DIAGNOSIS — I10 Essential (primary) hypertension: Secondary | ICD-10-CM | POA: Diagnosis present

## 2016-05-29 DIAGNOSIS — L8992 Pressure ulcer of unspecified site, stage 2: Secondary | ICD-10-CM | POA: Diagnosis present

## 2016-05-29 DIAGNOSIS — R402212 Coma scale, best verbal response, none, at arrival to emergency department: Secondary | ICD-10-CM | POA: Diagnosis present

## 2016-05-29 DIAGNOSIS — K56600 Partial intestinal obstruction, unspecified as to cause: Secondary | ICD-10-CM | POA: Diagnosis not present

## 2016-05-29 DIAGNOSIS — E1165 Type 2 diabetes mellitus with hyperglycemia: Secondary | ICD-10-CM | POA: Diagnosis present

## 2016-05-29 DIAGNOSIS — K567 Ileus, unspecified: Secondary | ICD-10-CM | POA: Diagnosis not present

## 2016-05-29 DIAGNOSIS — I4891 Unspecified atrial fibrillation: Secondary | ICD-10-CM | POA: Diagnosis present

## 2016-05-29 DIAGNOSIS — J9620 Acute and chronic respiratory failure, unspecified whether with hypoxia or hypercapnia: Secondary | ICD-10-CM | POA: Diagnosis not present

## 2016-05-29 DIAGNOSIS — L8990 Pressure ulcer of unspecified site, unspecified stage: Secondary | ICD-10-CM | POA: Insufficient documentation

## 2016-05-29 DIAGNOSIS — J9811 Atelectasis: Secondary | ICD-10-CM | POA: Diagnosis present

## 2016-05-29 DIAGNOSIS — N898 Other specified noninflammatory disorders of vagina: Secondary | ICD-10-CM | POA: Diagnosis not present

## 2016-05-29 DIAGNOSIS — I482 Chronic atrial fibrillation: Secondary | ICD-10-CM | POA: Diagnosis present

## 2016-05-29 DIAGNOSIS — N170 Acute kidney failure with tubular necrosis: Secondary | ICD-10-CM | POA: Diagnosis present

## 2016-05-29 DIAGNOSIS — I2510 Atherosclerotic heart disease of native coronary artery without angina pectoris: Secondary | ICD-10-CM | POA: Diagnosis present

## 2016-05-29 DIAGNOSIS — Z515 Encounter for palliative care: Secondary | ICD-10-CM | POA: Diagnosis not present

## 2016-05-29 DIAGNOSIS — Z8249 Family history of ischemic heart disease and other diseases of the circulatory system: Secondary | ICD-10-CM

## 2016-05-29 DIAGNOSIS — J9601 Acute respiratory failure with hypoxia: Secondary | ICD-10-CM | POA: Diagnosis present

## 2016-05-29 DIAGNOSIS — A419 Sepsis, unspecified organism: Secondary | ICD-10-CM | POA: Diagnosis present

## 2016-05-29 DIAGNOSIS — Z9911 Dependence on respirator [ventilator] status: Secondary | ICD-10-CM

## 2016-05-29 DIAGNOSIS — G9340 Encephalopathy, unspecified: Secondary | ICD-10-CM | POA: Diagnosis not present

## 2016-05-29 DIAGNOSIS — Z6841 Body Mass Index (BMI) 40.0 and over, adult: Secondary | ICD-10-CM

## 2016-05-29 DIAGNOSIS — R402112 Coma scale, eyes open, never, at arrival to emergency department: Secondary | ICD-10-CM | POA: Diagnosis present

## 2016-05-29 DIAGNOSIS — J690 Pneumonitis due to inhalation of food and vomit: Secondary | ICD-10-CM | POA: Diagnosis present

## 2016-05-29 DIAGNOSIS — R402312 Coma scale, best motor response, none, at arrival to emergency department: Secondary | ICD-10-CM | POA: Diagnosis present

## 2016-05-29 DIAGNOSIS — G9341 Metabolic encephalopathy: Secondary | ICD-10-CM | POA: Diagnosis present

## 2016-05-29 DIAGNOSIS — L03116 Cellulitis of left lower limb: Secondary | ICD-10-CM | POA: Diagnosis present

## 2016-05-29 DIAGNOSIS — Z7189 Other specified counseling: Secondary | ICD-10-CM

## 2016-05-29 DIAGNOSIS — J9600 Acute respiratory failure, unspecified whether with hypoxia or hypercapnia: Secondary | ICD-10-CM | POA: Diagnosis not present

## 2016-05-29 DIAGNOSIS — R6521 Severe sepsis with septic shock: Secondary | ICD-10-CM | POA: Diagnosis present

## 2016-05-29 DIAGNOSIS — Z7901 Long term (current) use of anticoagulants: Secondary | ICD-10-CM | POA: Diagnosis not present

## 2016-05-29 DIAGNOSIS — Z452 Encounter for adjustment and management of vascular access device: Secondary | ICD-10-CM

## 2016-05-29 DIAGNOSIS — Z66 Do not resuscitate: Secondary | ICD-10-CM

## 2016-05-29 DIAGNOSIS — R195 Other fecal abnormalities: Secondary | ICD-10-CM

## 2016-05-29 DIAGNOSIS — E872 Acidosis: Secondary | ICD-10-CM | POA: Diagnosis present

## 2016-05-29 DIAGNOSIS — K7290 Hepatic failure, unspecified without coma: Secondary | ICD-10-CM | POA: Diagnosis not present

## 2016-05-29 DIAGNOSIS — E1151 Type 2 diabetes mellitus with diabetic peripheral angiopathy without gangrene: Secondary | ICD-10-CM | POA: Diagnosis present

## 2016-05-29 DIAGNOSIS — D696 Thrombocytopenia, unspecified: Secondary | ICD-10-CM | POA: Diagnosis not present

## 2016-05-29 DIAGNOSIS — E785 Hyperlipidemia, unspecified: Secondary | ICD-10-CM | POA: Diagnosis present

## 2016-05-29 DIAGNOSIS — D649 Anemia, unspecified: Secondary | ICD-10-CM | POA: Diagnosis present

## 2016-05-29 DIAGNOSIS — R0600 Dyspnea, unspecified: Secondary | ICD-10-CM

## 2016-05-29 DIAGNOSIS — E876 Hypokalemia: Secondary | ICD-10-CM | POA: Diagnosis not present

## 2016-05-29 DIAGNOSIS — I481 Persistent atrial fibrillation: Secondary | ICD-10-CM | POA: Diagnosis present

## 2016-05-29 DIAGNOSIS — F419 Anxiety disorder, unspecified: Secondary | ICD-10-CM | POA: Diagnosis not present

## 2016-05-29 DIAGNOSIS — E8770 Fluid overload, unspecified: Secondary | ICD-10-CM | POA: Diagnosis not present

## 2016-05-29 DIAGNOSIS — J9690 Respiratory failure, unspecified, unspecified whether with hypoxia or hypercapnia: Secondary | ICD-10-CM

## 2016-05-29 LAB — BASIC METABOLIC PANEL
ANION GAP: 15 (ref 5–15)
BUN: 85 mg/dL — AB (ref 6–20)
CALCIUM: 7 mg/dL — AB (ref 8.9–10.3)
CO2: 12 mmol/L — AB (ref 22–32)
Chloride: 100 mmol/L — ABNORMAL LOW (ref 101–111)
Creatinine, Ser: 3.39 mg/dL — ABNORMAL HIGH (ref 0.44–1.00)
GFR calc Af Amer: 16 mL/min — ABNORMAL LOW (ref >60)
GFR calc non Af Amer: 14 mL/min — ABNORMAL LOW (ref >60)
Glucose, Bld: 482 mg/dL — ABNORMAL HIGH (ref 65–99)
Potassium: 4.8 mmol/L (ref 3.5–5.1)
Sodium: 127 mmol/L — ABNORMAL LOW (ref 135–145)

## 2016-05-29 LAB — URINALYSIS, COMPLETE (UACMP) WITH MICROSCOPIC
BACTERIA UA: NONE SEEN
BILIRUBIN URINE: NEGATIVE
GLUCOSE, UA: 50 mg/dL — AB
KETONES UR: NEGATIVE mg/dL
Leukocytes, UA: NEGATIVE
NITRITE: NEGATIVE
PH: 5 (ref 5.0–8.0)
Protein, ur: 30 mg/dL — AB
Specific Gravity, Urine: 1.017 (ref 1.005–1.030)

## 2016-05-29 LAB — CBC WITH DIFFERENTIAL/PLATELET
BASOS ABS: 0 10*3/uL (ref 0–0.1)
Basophils Relative: 0 %
Eosinophils Absolute: 0 10*3/uL (ref 0–0.7)
Eosinophils Relative: 0 %
HEMATOCRIT: 44.6 % (ref 35.0–47.0)
Hemoglobin: 14.4 g/dL (ref 12.0–16.0)
LYMPHS ABS: 2.2 10*3/uL (ref 1.0–3.6)
LYMPHS PCT: 11 %
MCH: 30.8 pg (ref 26.0–34.0)
MCHC: 32.2 g/dL (ref 32.0–36.0)
MCV: 95.5 fL (ref 80.0–100.0)
MONOS PCT: 18 %
Monocytes Absolute: 3.6 10*3/uL — ABNORMAL HIGH (ref 0.2–0.9)
NEUTROS PCT: 71 %
Neutro Abs: 14 10*3/uL — ABNORMAL HIGH (ref 1.4–6.5)
Platelets: 438 10*3/uL (ref 150–440)
RBC: 4.67 MIL/uL (ref 3.80–5.20)
RDW: 14.3 % (ref 11.5–14.5)
WBC: 19.8 10*3/uL — AB (ref 3.6–11.0)

## 2016-05-29 LAB — BLOOD GAS, ARTERIAL
ACID-BASE DEFICIT: 18.2 mmol/L — AB (ref 0.0–2.0)
ACID-BASE DEFICIT: 21.4 mmol/L — AB (ref 0.0–2.0)
Allens test (pass/fail): POSITIVE — AB
BICARBONATE: 9.4 mmol/L — AB (ref 20.0–28.0)
Bicarbonate: 9.6 mmol/L — ABNORMAL LOW (ref 20.0–28.0)
FIO2: 1
FIO2: 0.5
LHR: 16 {breaths}/min
LHR: 16 {breaths}/min
O2 SAT: 92.7 %
O2 Saturation: 99.9 %
PATIENT TEMPERATURE: 37
PCO2 ART: 38 mmHg (ref 32.0–48.0)
PEEP/CPAP: 5 cmH2O
PEEP: 5 cmH2O
PH ART: 7.13 — AB (ref 7.350–7.450)
Patient temperature: 37
VT: 450 mL
VT: 450 mL
pCO2 arterial: 29 mmHg — ABNORMAL LOW (ref 32.0–48.0)
pH, Arterial: 7 — CL (ref 7.350–7.450)
pO2, Arterial: 323 mmHg — ABNORMAL HIGH (ref 83.0–108.0)
pO2, Arterial: 98 mmHg (ref 83.0–108.0)

## 2016-05-29 LAB — COMPREHENSIVE METABOLIC PANEL
ALBUMIN: 2.1 g/dL — AB (ref 3.5–5.0)
ALT: 30 U/L (ref 14–54)
ANION GAP: 19 — AB (ref 5–15)
AST: 34 U/L (ref 15–41)
Alkaline Phosphatase: 89 U/L (ref 38–126)
BILIRUBIN TOTAL: 3 mg/dL — AB (ref 0.3–1.2)
BUN: 86 mg/dL — AB (ref 6–20)
CHLORIDE: 96 mmol/L — AB (ref 101–111)
CO2: 11 mmol/L — ABNORMAL LOW (ref 22–32)
Calcium: 7.8 mg/dL — ABNORMAL LOW (ref 8.9–10.3)
Creatinine, Ser: 3.42 mg/dL — ABNORMAL HIGH (ref 0.44–1.00)
GFR, EST AFRICAN AMERICAN: 15 mL/min — AB (ref >60)
GFR, EST NON AFRICAN AMERICAN: 13 mL/min — AB (ref >60)
Glucose, Bld: 391 mg/dL — ABNORMAL HIGH (ref 65–99)
POTASSIUM: 4.7 mmol/L (ref 3.5–5.1)
SODIUM: 126 mmol/L — AB (ref 135–145)
TOTAL PROTEIN: 5.3 g/dL — AB (ref 6.5–8.1)

## 2016-05-29 LAB — LIPASE, BLOOD: Lipase: 16 U/L (ref 11–51)

## 2016-05-29 LAB — AMYLASE: Amylase: 71 U/L (ref 28–100)

## 2016-05-29 LAB — LACTIC ACID, PLASMA
LACTIC ACID, VENOUS: 2.1 mmol/L — AB (ref 0.5–1.9)
Lactic Acid, Venous: 4.8 mmol/L (ref 0.5–1.9)

## 2016-05-29 LAB — PHOSPHORUS: PHOSPHORUS: 10.6 mg/dL — AB (ref 2.5–4.6)

## 2016-05-29 LAB — PROCALCITONIN: Procalcitonin: 2.26 ng/mL

## 2016-05-29 LAB — PROTIME-INR
INR: 2.53
Prothrombin Time: 27.7 seconds — ABNORMAL HIGH (ref 11.4–15.2)

## 2016-05-29 LAB — MRSA PCR SCREENING: MRSA BY PCR: NEGATIVE

## 2016-05-29 LAB — MAGNESIUM: Magnesium: 2 mg/dL (ref 1.7–2.4)

## 2016-05-29 LAB — TROPONIN I: TROPONIN I: 0.04 ng/mL — AB (ref <0.03)

## 2016-05-29 MED ORDER — LEVOFLOXACIN IN D5W 750 MG/150ML IV SOLN: 750.0000 mg | Freq: Once | INTRAVENOUS | Status: DC

## 2016-05-29 MED ORDER — VANCOMYCIN HCL IN DEXTROSE 1-5 GM/200ML-% IV SOLN
1000.0000 mg | Freq: Once | INTRAVENOUS | Status: AC
  Administered 2016-05-29: 1000 mg via INTRAVENOUS
  Filled 2016-05-29: qty 200

## 2016-05-29 MED ORDER — DEXTROSE 5 % IV SOLN
2.0000 g | INTRAVENOUS | Status: DC
  Filled 2016-05-29: qty 2

## 2016-05-29 MED ORDER — NOREPINEPHRINE BITARTRATE 1 MG/ML IV SOLN
1.0000 ug/min | Freq: Once | INTRAVENOUS | Status: AC
  Administered 2016-05-29: 1 ug/min via INTRAVENOUS
  Filled 2016-05-29: qty 4

## 2016-05-29 MED ORDER — MIDAZOLAM HCL 2 MG/2ML IJ SOLN
2.0000 mg | INTRAMUSCULAR | Status: DC | PRN
Start: 2016-05-29 — End: 2016-05-30
  Administered 2016-05-29 (×2): 2 mg via INTRAVENOUS
  Administered 2016-05-30: 4 mg via INTRAVENOUS

## 2016-05-29 MED ORDER — SODIUM CHLORIDE 0.9 % IV BOLUS (SEPSIS)
1000.0000 mL | Freq: Once | INTRAVENOUS | Status: AC
  Administered 2016-05-29: 1000 mL via INTRAVENOUS

## 2016-05-29 MED ORDER — ONDANSETRON HCL 4 MG/2ML IJ SOLN: 4.0000 mg | Freq: Four times a day (QID) | INTRAMUSCULAR | Status: DC | PRN

## 2016-05-29 MED ORDER — LEVOFLOXACIN IN D5W 750 MG/150ML IV SOLN
750.0000 mg | INTRAVENOUS | Status: DC
  Administered 2016-05-29: 750 mg via INTRAVENOUS
  Filled 2016-05-29: qty 150

## 2016-05-29 MED ORDER — SENNOSIDES 8.8 MG/5ML PO SYRP
5.0000 mL | ORAL_SOLUTION | Freq: Two times a day (BID) | ORAL | Status: DC | PRN
  Filled 2016-05-29: qty 5

## 2016-05-29 MED ORDER — VANCOMYCIN HCL IN DEXTROSE 1-5 GM/200ML-% IV SOLN: 1000.0000 mg | Freq: Once | INTRAVENOUS | Status: DC

## 2016-05-29 MED ORDER — INSULIN ASPART 100 UNIT/ML ~~LOC~~ SOLN: 0.0000 [IU] | Freq: Three times a day (TID) | SUBCUTANEOUS | Status: DC

## 2016-05-29 MED ORDER — SODIUM CHLORIDE 0.9 % IV SOLN: 25.0000 ug/h | INTRAVENOUS | Status: DC

## 2016-05-29 MED ORDER — FENTANYL 2500MCG IN NS 250ML (10MCG/ML) PREMIX INFUSION
0.0000 ug/h | INTRAVENOUS | Status: DC
  Administered 2016-05-29: 50 ug/h via INTRAVENOUS
  Administered 2016-05-30 (×2): 300 ug/h via INTRAVENOUS
  Administered 2016-05-30: 400 ug/h via INTRAVENOUS
  Administered 2016-05-31: 300 ug/h via INTRAVENOUS
  Administered 2016-05-31: 175 ug/h via INTRAVENOUS
  Administered 2016-05-31: 300 ug/h via INTRAVENOUS
  Administered 2016-06-01 – 2016-06-04 (×4): 100 ug/h via INTRAVENOUS
  Administered 2016-06-05: 200 ug/h via INTRAVENOUS
  Administered 2016-06-06: 250 ug/h via INTRAVENOUS
  Filled 2016-05-29 (×13): qty 250

## 2016-05-29 MED ORDER — SODIUM CHLORIDE 0.9 % IV SOLN
INTRAVENOUS | Status: DC
  Administered 2016-05-29: 21:00:00 via INTRAVENOUS

## 2016-05-29 MED ORDER — DEXTROSE 5 % IV SOLN: 2.0000 g | Freq: Once | INTRAVENOUS | Status: DC

## 2016-05-29 MED ORDER — NOREPINEPHRINE BITARTRATE 1 MG/ML IV SOLN
0.0000 ug/min | INTRAVENOUS | Status: DC
  Administered 2016-05-29: 40 ug/min via INTRAVENOUS
  Administered 2016-05-29: 30 ug/min via INTRAVENOUS
  Filled 2016-05-29 (×3): qty 4

## 2016-05-29 MED ORDER — SODIUM CHLORIDE 0.9 % IV BOLUS (SEPSIS)
500.0000 mL | Freq: Once | INTRAVENOUS | Status: AC
  Administered 2016-05-29: 500 mL via INTRAVENOUS

## 2016-05-29 MED ORDER — ORAL CARE MOUTH RINSE
15.0000 mL | OROMUCOSAL | Status: DC
  Administered 2016-05-29 – 2016-06-06 (×72): 15 mL via OROMUCOSAL

## 2016-05-29 MED ORDER — INSULIN ASPART 100 UNIT/ML ~~LOC~~ SOLN: 0.0000 [IU] | SUBCUTANEOUS | Status: DC

## 2016-05-29 MED ORDER — FENTANYL CITRATE (PF) 100 MCG/2ML IJ SOLN
100.0000 ug | Freq: Once | INTRAMUSCULAR | Status: AC
  Administered 2016-05-29: 100 ug via INTRAVENOUS
  Filled 2016-05-29: qty 2

## 2016-05-29 MED ORDER — PRO-STAT SUGAR FREE PO LIQD
30.0000 mL | Freq: Two times a day (BID) | ORAL | Status: DC
  Administered 2016-05-31: 30 mL

## 2016-05-29 MED ORDER — SODIUM BICARBONATE 8.4 % IV SOLN
150.0000 meq | Freq: Once | INTRAVENOUS | Status: AC
  Administered 2016-05-29: 150 meq via INTRAVENOUS

## 2016-05-29 MED ORDER — SODIUM BICARBONATE 8.4 % IV SOLN
INTRAVENOUS | Status: DC
  Administered 2016-05-29: 22:00:00 via INTRAVENOUS
  Filled 2016-05-29 (×6): qty 150

## 2016-05-29 MED ORDER — FENTANYL BOLUS VIA INFUSION
50.0000 ug | INTRAVENOUS | Status: DC | PRN
  Filled 2016-05-29: qty 100

## 2016-05-29 MED ORDER — VITAL HIGH PROTEIN PO LIQD: 1000.0000 mL | ORAL | Status: DC

## 2016-05-29 MED ORDER — PANTOPRAZOLE SODIUM 40 MG IV SOLR
40.0000 mg | INTRAVENOUS | Status: DC
  Administered 2016-05-29 – 2016-05-30 (×2): 40 mg via INTRAVENOUS
  Filled 2016-05-29 (×2): qty 40

## 2016-05-29 MED ORDER — SODIUM BICARBONATE 8.4 % IV SOLN
150.0000 meq | Freq: Once | INTRAVENOUS | Status: DC
  Filled 2016-05-29 (×2): qty 150

## 2016-05-29 MED ORDER — BISACODYL 10 MG RE SUPP
10.0000 mg | Freq: Every day | RECTAL | Status: DC | PRN
  Filled 2016-05-29: qty 1

## 2016-05-29 MED ORDER — MIDAZOLAM HCL 2 MG/2ML IJ SOLN
2.0000 mg | INTRAMUSCULAR | Status: DC | PRN
  Administered 2016-05-30 (×2): 4 mg via INTRAVENOUS
  Filled 2016-05-29 (×4): qty 4

## 2016-05-29 MED ORDER — NOREPINEPHRINE BITARTRATE 1 MG/ML IV SOLN
1.0000 ug/min | Freq: Once | INTRAVENOUS | Status: AC
  Administered 2016-05-29: 17 ug/min via INTRAVENOUS
  Filled 2016-05-29 (×2): qty 4

## 2016-05-29 MED ORDER — SODIUM CHLORIDE 0.9 % IV SOLN
INTRAVENOUS | Status: DC
  Filled 2016-05-29: qty 1

## 2016-05-29 MED ORDER — DEXTROSE 5 % IV SOLN
2.0000 g | Freq: Once | INTRAVENOUS | Status: AC
  Administered 2016-05-29: 2 g via INTRAVENOUS
  Filled 2016-05-29: qty 2

## 2016-05-29 MED ORDER — ROCURONIUM BROMIDE 50 MG/5ML IV SOLN
100.0000 mg | Freq: Once | INTRAVENOUS | Status: AC
  Administered 2016-05-29: 100 mg via INTRAVENOUS
  Filled 2016-05-29: qty 10

## 2016-05-29 MED ORDER — INSULIN ASPART 100 UNIT/ML ~~LOC~~ SOLN: 0.0000 [IU] | Freq: Every day | SUBCUTANEOUS | Status: DC

## 2016-05-29 MED ORDER — ATROPINE SULFATE 1 MG/ML IJ SOLN
0.5000 mg | Freq: Once | INTRAMUSCULAR | Status: AC
  Administered 2016-05-29: 0.5 mg via INTRAVENOUS
  Filled 2016-05-29: qty 0.5

## 2016-05-29 MED ORDER — MIDAZOLAM HCL 2 MG/2ML IJ SOLN
1.0000 mg | Freq: Once | INTRAMUSCULAR | Status: AC
  Administered 2016-05-29: 1 mg via INTRAVENOUS

## 2016-05-29 MED ORDER — ONDANSETRON HCL 4 MG PO TABS: 4.0000 mg | ORAL_TABLET | Freq: Four times a day (QID) | ORAL | Status: DC | PRN

## 2016-05-29 MED ORDER — CHLORHEXIDINE GLUCONATE 0.12% ORAL RINSE (MEDLINE KIT)
15.0000 mL | Freq: Two times a day (BID) | OROMUCOSAL | Status: DC
  Administered 2016-05-29 – 2016-06-06 (×16): 15 mL via OROMUCOSAL

## 2016-05-29 MED ORDER — INSULIN ASPART 100 UNIT/ML ~~LOC~~ SOLN
2.0000 [IU] | SUBCUTANEOUS | Status: DC
  Administered 2016-05-29: 15 [IU] via SUBCUTANEOUS
  Filled 2016-05-29: qty 15

## 2016-05-29 NOTE — Progress Notes (Signed)
Transported pt to ICU on vent without incident. Pt remains on vent and tolerating well at this time.

## 2016-05-29 NOTE — ED Notes (Addendum)
OG tube 16 placed - measured 70 at the lips and secured - placement confirmed by gastric secretions - CXR ordered to confirm placement of intubation tube and OG tube

## 2016-05-29 NOTE — ED Notes (Signed)
Called pharmacy to send more Levophed

## 2016-05-29 NOTE — ED Notes (Addendum)
Dr Pershing ProudSchaevitz attempted to place central line with difficulty threading - IJ left in place instead in right side of neck

## 2016-05-29 NOTE — H&P (Addendum)
[facility] [place] [facility]   PATIENT NAME: Laura Kline    MR#:  [id number]  DATE OF BIRTH:  01/16/1955  DATE OF ADMISSION:  05/08/2016  PRIMARY CARE PHYSICIAN: Patient, No Pcp Per   REQUESTING/REFERRING PHYSICIAN: Ms Vincenza Hews  CHIEF COMPLAINT:   Unresponsive  HISTORY OF PRESENT ILLNESS:  Laura Kline  is a 61 y.o. female with a known history of Coronary artery disease, essential hypertension, hypertension, atrial fibrillation was just admitted and discharged from the hospital on May 21 after she was treated for AKI, vaginal bleeding and acute transaminitis of unclear etiology. Patient was discharged to rehabilitation Center With antibiotics clindamycin and levofloxacin for left foot cellulitis. Patient's Eliquis was resumed at the time of discharge is patient was not actively bleeding vaginally. Today patient was found to be short of breath and  unresponsive at the rehabilitation center. Patient was sent over to the emergency department. she was found to be bradycardiac, Atropine was given and was intubated in the ED.she was very hypotensive and found to be in septic shock. Cultures were obtained and broad spectrum IV antibiotics were started CT head is ordered which is pending at this time. No family members at bedside  PAST MEDICAL HISTORY:   Past Medical History:  Diagnosis Date  . Coronary atherosclerosis    LAD calcifications noted on CT scan in 2016 with negative nuclear stress test.  . DKA (diabetic ketoacidoses) (HCC)    a. 05/2009  . Essential hypertension   . History of Bell's palsy   . Hyperlipidemia   . Hypertension   . Hypokalemia   . IDDM (insulin dependent diabetes mellitus) (HCC)   . Long-term insulin use (HCC)   . Morbid obesity (HCC)   . Peripheral vascular disease (HCC)     PAST SURGICAL HISTOIRY:   Past Surgical History:  Procedure Laterality Date  . CATARACT EXTRACTION W/PHACO Left 02/08/2015   Procedure: CATARACT  EXTRACTION PHACO AND INTRAOCULAR LENS PLACEMENT (IOC);  Surgeon: Galen Manila, MD;  Location: [facility] ORS;  Service: Ophthalmology;  Laterality: Left;  [place] 01:33 AP% 15.9 CDE 14.94 flyuid pack lot # [id number]  . CESAREAN SECTION     x2  . EYE SURGERY Right    Cataract Extraction with IOL  . FOREIGN BODY REMOVAL Left 05/13/2016   Procedure: REMOVAL FOREIGN BODY EXTREMITY;  Surgeon: Recardo Evangelist, DPM;  Location: [facility] ORS;  Service: Podiatry;  Laterality: Left;  . INCISION AND DRAINAGE Left 05/13/2016   Procedure: INCISION AND DRAINAGE;  Surgeon: Recardo Evangelist, DPM;  Location: [facility] ORS;  Service: Podiatry;  Laterality: Left;    SOCIAL HISTORY:   Social History  Substance Use Topics  . Smoking status: Never Smoker  . Smokeless tobacco: Never Used  . Alcohol use No    FAMILY HISTORY:   Family History  Problem Relation Age of Onset  . Hypertension Mother   . Hypertension Father   . CAD Father        s/p cabg  . Heart attack Father   . Heart failure Father     DRUG ALLERGIES:   Allergies  Allergen Reactions  . Rofecoxib Other (See Comments)    Reaction: unknown  . Penicillins Rash and Other (See Comments)    Has patient had a PCN reaction causing immediate rash, facial/tongue/throat swelling, SOB or lightheadedness with hypotension: Unknown Has patient had a PCN reaction causing severe rash involving mucus membranes or skin necrosis: Unknown Has patient had a PCN reaction that required hospitalization:  Unknown Has patient had a PCN reaction occurring within the last 10 years: Unknown If all of the above answers are "NO", then may proceed with Cephalosporin use.     REVIEW OF SYSTEMS:  ROS unobtainable patient is intubated  MEDICATIONS AT HOME:   Prior to Admission medications   Medication Sig Start Date End Date Taking? Authorizing Provider  apixaban (ELIQUIS) 5 MG TABS tablet Take 1 tablet (5 mg total) by mouth 2 (two) times daily. 05/18/16  Yes Torrin Frein, Deanna Artis, MD   clindamycin (CLEOCIN) 300 MG capsule Take 1 capsule (300 mg total) by mouth 4 (four) times daily. 05/28/16 06/03/16 Yes Enedina Finner, MD  digoxin (LANOXIN) 0.25 MG tablet Take 1 tablet (0.25 mg total) by mouth daily. 05/19/16  Yes Zaylia Riolo, Deanna Artis, MD  diltiazem (CARDIZEM CD) 180 MG 24 hr capsule Take 1 capsule (180 mg total) by mouth daily. 05/19/16  Yes Tzipora Mcinroy, Deanna Artis, MD  furosemide (LASIX) 40 MG tablet Take 1 tablet (40 mg total) by mouth daily. 05/19/16  Yes Moo Gravley, Deanna Artis, MD  HYDROcodone-acetaminophen (NORCO) 7.5-325 MG tablet Take 1 tablet by mouth every 6 (six) hours as needed for moderate pain. 05/18/16  Yes Anthonia Monger, Deanna Artis, MD  Insulin Glargine (LANTUS SOLOSTAR) 100 UNIT/ML Solostar Pen Inject 50 Units into the skin at bedtime. 05/28/16  Yes Enedina Finner, MD  levofloxacin (LEVAQUIN) 500 MG tablet Take 1 tablet (500 mg total) by mouth daily. 05/28/16 06/03/16 Yes Enedina Finner, MD  loperamide (IMODIUM A-D) 2 MG tablet Take 2 mg by mouth every 8 (eight) hours as needed for diarrhea or loose stools.   Yes [provider]  metoprolol (LOPRESSOR) 50 MG tablet Take 1 tablet (50 mg total) by mouth 2 (two) times daily. 10/07/14  Yes Iran Ouch, MD  norethindrone (AYGESTIN) 5 MG tablet Take 1 tablet (5 mg total) by mouth 3 (three) times daily. 05/28/16  Yes Enedina Finner, MD  nystatin (MYCOSTATIN/NYSTOP) powder Apply 1 g topically as needed (for rash and redness).   Yes [provider]  ondansetron (ZOFRAN) 4 MG tablet Take 1 tablet (4 mg total) by mouth every 6 (six) hours as needed for nausea. 05/18/16  Yes Faithlynn Deeley, Deanna Artis, MD  rosuvastatin (CRESTOR) 10 MG tablet Take 1 tablet (10 mg total) by mouth daily after supper. 10/07/14  Yes Iran Ouch, MD  senna-docusate (SENOKOT-S) 8.6-50 MG tablet Take 1 tablet by mouth 2 (two) times daily. 05/18/16  Yes Chirsty Armistead, Deanna Artis, MD      VITAL SIGNS:  Blood pressure (!) 96/51, pulse 95, temperature 98 F (36.7 C), temperature source Axillary, height 5\' 6"   (1.676 m), weight 136.1 kg (300 lb), SpO2 100 %.  PHYSICAL EXAMINATION:  GENERAL:  61 y.o.-year-old patient lying in the bed ,Morbidly obese intubated  EYES: Pupils equal, round, reactive to light and accommodation. No scleral icterus.  HEENT: Head atraumatic, normocephalic. ET tube intact, OG tube  NECK:  Supple, no jugular venous distention. Rt IJ central lineNo thyroid enlargement, no tenderness.  LUNGS: Diminished breath sounds bilaterally, no wheezing, rales,rhonchi  Crepitation. CARDIOVASCULAR: Irregularly irregular No murmurs, rubs, or gallops.  ABDOMEN: Soft, distended. Bowel sounds present.EXTREMITIES: No pedal edema, cyanosis, or clubbing.  NEUROLOGIC: pt is intubated  PSYCHIATRIC: pt is intubated   SKIN: Left foot with minimal erythema, base of the second toe with 3 sutures, no discharge Upper torso looks flushed  LABORATORY PANEL:   CBC  Recent Labs Lab 05/18/2016 1503  WBC 19.8*  HGB 14.4  HCT 44.6  PLT 438   ------------------------------------------------------------------------------------------------------------------  Chemistries   Recent Labs Lab 2016-06-10 1503  NA 126*  K 4.7  CL 96*  CO2 11*  GLUCOSE 391*  BUN 86*  CREATININE 3.42*  CALCIUM 7.8*  AST 34  ALT 30  ALKPHOS 89  BILITOT 3.0*   ------------------------------------------------------------------------------------------------------------------  Cardiac Enzymes  Recent Labs Lab Jun 10, 2016 1503  TROPONINI 0.04*   ------------------------------------------------------------------------------------------------------------------  RADIOLOGY:  Dg Chest 1 View  Result Date: 06-10-2016 CLINICAL DATA:  Central line EXAM: CHEST 1 VIEW COMPARISON:  06-10-2016 FINDINGS: Repositioned endotracheal tube, the tip is approximately 4.2 cm superior to the carina. Esophageal tube tip is below the diaphragm. Right IJ central venous catheter tip faintly visualized over the proximal right atrium. No  pneumothorax. Cardiomegaly with mild central congestion. Patchy perihilar and basilar opacity. IMPRESSION: 1. Repositioning of endotracheal tube 2. Right IJ central venous catheter tip overlies proximal right atrium, no pneumothorax 3. Low lung volumes. Slight increased patchy perihilar opacity compared to prior which may reflect increasing atelectasis or vascular congestion. Electronically Signed   By: Jasmine Pang M.D.   On: Jun 10, 2016 17:09   Dg Chest Port 1 View  Result Date: Jun 10, 2016 CLINICAL DATA:  ETT placement EXAM: PORTABLE CHEST 1 VIEW COMPARISON:  05/15/2016 FINDINGS: Endotracheal tube tip encroaches right mainstem bronchus orifice. Esophageal tube tip is below the diaphragm but not well visualized. Low lung volumes with subsegmental atelectasis at both bases. Borderline to mild cardiomegaly. No edema or infiltrate. IMPRESSION: Endotracheal tube tip encroaches the right mainstem bronchus orifice. Low lung volumes with subsegmental atelectasis at the bases. Electronically Signed   By: Jasmine Pang M.D.   On: Jun 10, 2016 15:59   Dg Abd Portable 1 View  Result Date: 2016-06-10 CLINICAL DATA:  OG tube placement EXAM: PORTABLE ABDOMEN - 1 VIEW COMPARISON:  None. FINDINGS: Orogastric tube with the tip projecting over the antrum of the stomach. There is no bowel dilatation to suggest obstruction. There is no evidence of pneumoperitoneum, portal venous gas or pneumatosis. There are no pathologic calcifications along the expected course of the ureters. The osseous structures are unremarkable. IMPRESSION: Orogastric tube with the tip projecting over the antrum of the stomach. Electronically Signed   By: Elige Ko   On: 2016/06/10 15:59    EKG:   Orders placed or performed during the hospital encounter of Jun 10, 2016  . EKG 12-Lead  . EKG 12-Lead  . ED EKG 12-Lead  . ED EKG 12-Lead    IMPRESSION AND PLAN:   Patient was found to be short of breath and  unresponsive at the rehabilitation  center. Patient was sent over to the emergency department. she was found to be bradycardiac, Atropine was given and was intubated in the ED.she was very hypotensive and found to be in septic shock  # Acute encephalopathy-metabolic secondary to septic shock and acute respiratory and renal failure Admit to intensive care unit  CT head is pending Pancultures on broad-spectrum IV antibiotics Patient is intubated  #Acute hypoxic respiratory failure Patient is currently intubated Vent protocol per intensivist   #Septic shock with multi organ failure -etiology unclear Pancultures were obtained Patient was given cefepime and vancomycin in the ED , will broaden coverage with Aztreonam , vanc and levofloxacin Pressors Levophed , titrate for MAP >65 Patient was just discharged from hospital on May 21 was given clindamycin and levofloxacin for left foot cellulitis. Patient had left foot surgery for foreign body and abscess about 12 days ago Will consult infectious disease and podiatry Dr. Alberteen Spindle   #Acute kidney injury  with hyponatremia Will get renal ultrasound Patient's sodium is at 135 and creatinine 1.41 at her baseline at the time of discharge on May 21 Foley catheter for output monitoring Aggressive hydration with IV fluids and avoid nephrotoxins   #Recent history of vaginal bleeding now with stool occult blood positive Hold Eliquis  Will obtain CT abdomen and pelvis Hemoccult was positive in the ED could be contaminated stool from the vaginal blood ;repeat Hemoccult, GYN consult , will consider consulting GI if needed Hemoglobin is stable at this time -14.4  # History of atrial fibrillation holding patient's or liquids at this time provide Cardizem or Lopressor IV as needed for rate control    #Diabetes mellitus  currently patient is nothing by mouth sliding scale insulin might consider insulin drip per ICU protocol   GI prophylaxis with Protonix   Pts is very sick with multi  organ failure, leading to high mortality Poor prognosis   All the records are reviewed and case discussed with ED provider. Call placed in voicemail left to spouse Mr. Szczesny to call us back to update her status, awaiting callback  CODE STATUS: fc   TOTAL critical care TIME TAKING CARE OF THIS PATIENT: 50  minutes.   Note: This dictation was prepared with Dragon dictation along with smaller phrase technology. Any transcriptional errors that result from this process are unintentional.  Ramonita LabGouru, Dakisha Schoof M.D on 05/31/2016 at 5:19 PM  Between 7am to 6pm - Pager - [phone]  After 6pm go to [email or web address] - password [email or web address]  Fabio Neighborsagle [facility]  Office  [phone]  CC: Primary care physician; Patient, No Pcp Per

## 2016-05-29 NOTE — Progress Notes (Signed)
ET tube pulled back 2 cm per the ER physician and taped at 21 cm at lip

## 2016-05-29 NOTE — ED Notes (Addendum)
Intubated by Dr Pershing ProudSchaevitz - tube 7.5 at 22 with color change

## 2016-05-29 NOTE — ED Provider Notes (Signed)
[facility] Emergency Department Provider Note  ____________________________________________   First MD Initiated Contact with Patient 05/15/2016 1525     (approximate)  I have reviewed the triage vital signs and the nursing notes.   HISTORY  Chief Complaint Respiratory Distress   HPI Arnett Loran Auguste is a 61 y.o. female with a history of coronary artery disease, diabetes and a recent admission for acute renal failure, cellulitis and vaginal bleeding was presenting unresponsive to the emergency department. Fire rescue reports the patient was reportedly seen normal 15 minutes prior to being found unresponsive. EMS reported that the patient was minimally responsive en route, pulling at her mask. However, she deteriorated en route and is now completely unresponsive. Patient was placed on a 15 L nonrebreather mask empirically because a pulse ox was not able to be obtained initially. She has been 98 100% on the nonrebreather. Blood sugar above 300.  Patient was just discharged yesterday from the hospital.     Past Medical History:  Diagnosis Date  . Coronary atherosclerosis    LAD calcifications noted on CT scan in 2016 with negative nuclear stress test.  . DKA (diabetic ketoacidoses) (HCC)    a. 05/2009  . Essential hypertension   . History of Bell's palsy   . Hyperlipidemia   . Hypertension   . Hypokalemia   . IDDM (insulin dependent diabetes mellitus) (HCC)   . Long-term insulin use (HCC)   . Morbid obesity (HCC)   . Peripheral vascular disease [facility])     Patient Active Problem List   Diagnosis Date Noted  . AKI (acute kidney injury) (HCC) 05/26/2016  . RUQ pain   . Atrial fibrillation with rapid ventricular response (HCC)   . Cellulitis of left lower extremity   . Morbid obesity (HCC) 05/17/2016  . Persistent atrial fibrillation (HCC) 05/17/2016  . Sepsis (HCC) 05/12/2016  . Hyperlipidemia   . Essential hypertension   . Coronary  atherosclerosis   . Chest pain 07/26/2014    Past Surgical History:  Procedure Laterality Date  . CATARACT EXTRACTION W/PHACO Left 02/08/2015   Procedure: CATARACT EXTRACTION PHACO AND INTRAOCULAR LENS PLACEMENT (IOC);  Surgeon: Galen Manila, MD;  Location: [facility] ORS;  Service: Ophthalmology;  Laterality: Left;  [place] 01:33 AP% 15.9 CDE 14.94 flyuid pack lot # [id number]  . CESAREAN SECTION     x2  . EYE SURGERY Right    Cataract Extraction with IOL  . FOREIGN BODY REMOVAL Left 05/13/2016   Procedure: REMOVAL FOREIGN BODY EXTREMITY;  Surgeon: Recardo Evangelist, DPM;  Location: [facility] ORS;  Service: Podiatry;  Laterality: Left;  . INCISION AND DRAINAGE Left 05/13/2016   Procedure: INCISION AND DRAINAGE;  Surgeon: Recardo Evangelist, DPM;  Location: [facility] ORS;  Service: Podiatry;  Laterality: Left;    Prior to Admission medications   Medication Sig Start Date End Date Taking? Authorizing Provider  apixaban (ELIQUIS) 5 MG TABS tablet Take 1 tablet (5 mg total) by mouth 2 (two) times daily. 05/18/16  Yes Gouru, Deanna Artis, MD  clindamycin (CLEOCIN) 300 MG capsule Take 1 capsule (300 mg total) by mouth 4 (four) times daily. 05/28/16 06/03/16 Yes Enedina Finner, MD  digoxin (LANOXIN) 0.25 MG tablet Take 1 tablet (0.25 mg total) by mouth daily. 05/19/16  Yes Gouru, Deanna Artis, MD  diltiazem (CARDIZEM CD) 180 MG 24 hr capsule Take 1 capsule (180 mg total) by mouth daily. 05/19/16  Yes Gouru, Deanna Artis, MD  furosemide (LASIX) 40 MG tablet Take 1 tablet (40 mg total) by mouth  daily. 05/19/16  Yes Gouru, Deanna Artis, MD  HYDROcodone-acetaminophen (NORCO) 7.5-325 MG tablet Take 1 tablet by mouth every 6 (six) hours as needed for moderate pain. 05/18/16  Yes Gouru, Deanna Artis, MD  Insulin Glargine (LANTUS SOLOSTAR) 100 UNIT/ML Solostar Pen Inject 50 Units into the skin at bedtime. 05/28/16  Yes Enedina Finner, MD  levofloxacin (LEVAQUIN) 500 MG tablet Take 1 tablet (500 mg total) by mouth daily. 05/28/16 06/03/16 Yes Enedina Finner, MD  loperamide  (IMODIUM A-D) 2 MG tablet Take 2 mg by mouth every 8 (eight) hours as needed for diarrhea or loose stools.   Yes [provider]  metoprolol (LOPRESSOR) 50 MG tablet Take 1 tablet (50 mg total) by mouth 2 (two) times daily. 10/07/14  Yes Iran Ouch, MD  norethindrone (AYGESTIN) 5 MG tablet Take 1 tablet (5 mg total) by mouth 3 (three) times daily. 05/28/16  Yes Enedina Finner, MD  nystatin (MYCOSTATIN/NYSTOP) powder Apply 1 g topically as needed (for rash and redness).   Yes [provider]  ondansetron (ZOFRAN) 4 MG tablet Take 1 tablet (4 mg total) by mouth every 6 (six) hours as needed for nausea. 05/18/16  Yes Gouru, Deanna Artis, MD  rosuvastatin (CRESTOR) 10 MG tablet Take 1 tablet (10 mg total) by mouth daily after supper. 10/07/14  Yes Iran Ouch, MD  senna-docusate (SENOKOT-S) 8.6-50 MG tablet Take 1 tablet by mouth 2 (two) times daily. 05/18/16  Yes Gouru, Deanna Artis, MD    Allergies Rofecoxib and Penicillins  Family History  Problem Relation Age of Onset  . Hypertension Mother   . Hypertension Father   . CAD Father        s/p cabg  . Heart attack Father   . Heart failure Father     Social History Social History  Substance Use Topics  . Smoking status: Never Smoker  . Smokeless tobacco: Never Used  . Alcohol use No    Review of Systems  Level V caveat secondary to the patient being unresponsive.   ____________________________________________   PHYSICAL EXAM:  VITAL SIGNS: ED Triage Vitals  Enc Vitals Group     BP 06-22-16 1506 (!) 90/58     Pulse Rate June 22, 2016 1506 91     Resp --      Temp 2016/06/22 1506 98 F (36.7 C)     Temp Source 22-Jun-2016 1506 Axillary     SpO2 Jun 22, 2016 1500 99 %     Weight 22-Jun-2016 1501 300 lb (136.1 kg)     Height June 22, 2016 1501 5\' 6"  (1.676 m)     Head Circumference --      Peak Flow --      Pain Score --      Pain Loc --      Pain Edu? --      Excl. in GC? --     Constitutional: GCS 3 Eyes: Conjunctivae are  normal.  Head: Atraumatic. Nose: nasal cannula in place Mouth/Throat: Mucous membranes are dry Neck: No stridor.   Cardiovascular: Irregularly irregular rhythm. Grossly normal heart sounds.  Weak pulses to the dorsalis pedis bilaterally as well as radial pulses. Respiratory: Normal respiratory effort.  No retractions. Lungs CTAB. Gastrointestinal: Soft. No distention. Heme positive brown stool. However, possible contamination from the vaginal bleeding. Genitourinary:  Gross external exam, no blood found Musculoskeletal: Minimal bilateral lower extremity edema. Neurologic:  GCS of 3, unresponsive to sternal rub. Skin:  Sutures in place to the left foot at the MCP region. There is no region  of fluctuance. Mild erythema distally over the forefoot. No crepitus.   ____________________________________________   LABS (all labs ordered are listed, but only abnormal results are displayed)  Labs Reviewed  COMPREHENSIVE METABOLIC PANEL - Abnormal; Notable for the following:       Result Value   Sodium 126 (*)    Chloride 96 (*)    CO2 11 (*)    Glucose, Bld 391 (*)    BUN 86 (*)    Creatinine, Ser 3.42 (*)    Calcium 7.8 (*)    Total Protein 5.3 (*)    Albumin 2.1 (*)    Total Bilirubin 3.0 (*)    GFR calc non Af Amer 13 (*)    GFR calc Af Amer 15 (*)    Anion gap 19 (*)    All other components within normal limits  CBC WITH DIFFERENTIAL/PLATELET - Abnormal; Notable for the following:    WBC 19.8 (*)    Neutro Abs 14.0 (*)    Monocytes Absolute 3.6 (*)    All other components within normal limits  LACTIC ACID, PLASMA - Abnormal; Notable for the following:    Lactic Acid, Venous 4.8 (*)    All other components within normal limits  TROPONIN I - Abnormal; Notable for the following:    Troponin I 0.04 (*)    All other components within normal limits  CULTURE, BLOOD (ROUTINE X 2)  CULTURE, BLOOD (ROUTINE X 2)  LIPASE, BLOOD  LACTIC ACID, PLASMA  BLOOD GAS, ARTERIAL  URINALYSIS,  COMPLETE (UACMP) WITH MICROSCOPIC  URINALYSIS, ROUTINE W REFLEX MICROSCOPIC   ____________________________________________  EKG  ED ECG REPORT I, Arelia LongestSchaevitz,  David M, the attending physician, personally viewed and interpreted this ECG.   Date: 05/18/2016  EKG Time: 1505  Rate: 81  Rhythm: atrial fibrillation, rate 81  Axis: normal  Intervals:none  ST&T Change: No ST segment elevation or depression. No abnormal T-wave inversion.  ____________________________________________  RADIOLOGY  Images reviewed. Endotracheal tube will be moved back 2 cm. ____________________________________________   PROCEDURES  Procedure(s) performed:  INTUBATION Performed by: Arelia LongestSchaevitz,  David M  Required items: required blood products, implants, devices, and special equipment available Patient identity confirmed: provided demographic data and hospital-assigned identification number Time out: Immediately prior to procedure a "time out" was called to verify the correct patient, procedure, equipment, support staff and site/side marked as required.  Indications: airway protection  Intubation method: Glidescope Laryngoscopy   Preoxygenation: BVM  Sedatives:  versed.  Paralytic: Roccuronium  Tube Size: 7.5 cuffed  Post-procedure assessment: chest rise and ETCO2 monitor Breath sounds: equal and absent over the epigastrium Tube secured with: ETT holder Chest x-ray interpreted by radiologist and me.  Chest x-ray findings: endotracheal tube will be retracted 2 cm Patient tolerated the procedure well with no immediate complications.  CENTRAL LINE Performed by: Arelia LongestSchaevitz,  David M Consent: The procedure was performed in an emergent situation. Required items: required blood products, implants, devices, and special equipment available Patient identity confirmed: arm band and provided demographic data Time out: Immediately prior to procedure a "time out" was called to verify the correct patient,  procedure, equipment, support staff and site/side marked as required. Indications: vascular access Anesthesia: intubated/sedated Local anesthetic:general Anesthetic total: 0 Patient sedated: yes Preparation: skin prepped with 2% chlorhexidine Skin prep agent dried: skin prep agent completely dried prior to procedure Sterile barriers: all five maximum sterile barriers used - cap, mask, sterile gown, sterile gloves, and large sterile sheet Hand hygiene: hand hygiene performed  prior to central venous catheter insertion  Location details: right IJ  Catheter type: triple lumen Catheter size: 8 Fr Pre-procedure: landmarks identified Ultrasound guidance: yes Successful placement: yes Post-procedure: line sutured and dressing applied Assessment: blood return through all parts, free fluid flow, placement verified by x-ray and no pneumothorax on x-ray Patient tolerance: Patient tolerated the procedure well with no immediate complications.    Procedures  Critical Care performed:   CRITICAL CARE Performed by: Arelia Longest   Total critical care time: 35 minutes  Critical care time was exclusive of separately billable procedures and treating other patients.  Critical care was necessary to treat or prevent imminent or life-threatening deterioration.  Critical care was time spent personally by me on the following activities: development of treatment plan with patient and/or surrogate as well as nursing, discussions with consultants, evaluation of patient's response to treatment, examination of patient, obtaining history from patient or surrogate, ordering and performing treatments and interventions, ordering and review of laboratory studies, ordering and review of radiographic studies, pulse oximetry and re-evaluation of patient's condition.  ____________________________________________   INITIAL IMPRESSION / ASSESSMENT AND PLAN / ED COURSE  Pertinent labs & imaging results that  were available during my care of the patient were reviewed by me and considered in my medical decision making (see chart for details).  ----------------------------------------- 4:57 PM on 06/05/16 -----------------------------------------  Patient at one point became bradycardic to the 50s. She was also mild to moderately hypotensive at that time in the 80s and 90s. He was given a 0.5 mg injection of atropine with good response. Heart rate increased to the 1 teens. However, there was not a significant improvement in the blood pressure. Her blood pressure actually dropped at one point being in the 40s and 50s, systolic. Levophed was started peripherally and then transitioned to central administration via the central line.  Blood pressure improved with Levophed with maps now in the 50s and 60s. Sepsis alert was called. Pending CT of the head as well as the abdomen and pelvis at this time. Signed out to Dr. Amado Coe.  Patient to be admitted to the ICU.      ____________________________________________   FINAL CLINICAL IMPRESSION(S) / ED DIAGNOSES  Septic shock. Hyperglycemia. Hyponatremia. Altered mental status    NEW MEDICATIONS STARTED DURING THIS VISIT:  New Prescriptions   No medications on file     Note:  This document was prepared using Dragon voice recognition software and may include unintentional dictation errors.     Myrna Blazer, MD Jun 05, 2016 [phone]

## 2016-05-29 NOTE — Consult Note (Addendum)
PULMONARY / CRITICAL CARE MEDICINE   Name: Laura Kline MRN: [id number] DOB: 10/21/1955    ADMISSION DATE:  12/30/16   CONSULTATION DATE:  012/23/18  REFERRING MD:  Dr.Gouru  Reason: ICU management of septic shock and acute respiratory failure  CHIEF COMPLAINT:  Unresponsive  PT PROFILE: [age] F with multiple severe medical problems [facility] and readmitted via ED with altered mental status and respiratory arrest. Clinical data and presentation consistent with severe sepsis/septic shock, unclear source  MAJOR EVENTS/TEST RESULTS: 05/22 admitted with severe sepsis/septic shock, intubated.  05/22 CTAP: Bibasilar pulmonary opacities in the visualized portion of the lower lobes. No intra-abdominal pathology noted. 05/22 CT head: No acute abnormalities 05/23 CRRT initiated for anuric renal failure  INDWELLING DEVICES:: ETT 05/23 >>  L [place] CVL 05/23 >>  R IJ HD cath 05/23 >>    MICRO DATA: MRSA PCR 05/22 >>  Urine 05/22 >>  Resp  05/22 >>  Blood 05/22 >>   ANTIMICROBIALS:  Vanc 05/22 >>  Cefepime 05/22 >>    HISTORY OF PRESENT ILLNESS:   This is a 61 year old Caucasian female with a complex medical history as indicated below presented via EMS from a skilled nursing facility with acute respiratory distress and altered mental status. Upon ED arrival, patient was unresponsive with shallow labored breathing. She was emergently intubated. Her initial workup in the ED, suggested acute renal failure, severe lactic acidosis, and leukocytosis of 19.9. Patient was called in for sepsis and started on IV fluids, broad-spectrum antibiotics and pressors for septic shock. PCCM was consulted for further management. Patient remains in shock despite fluid resuscitation. She is currently on 2 pressors. ABG postintubation shows persistent severe metabolic acidosis. Patient was discharged from the hospital on 05/28/2016 after hospitalization and treatment for  postmenopausal vaginal bleeding, asymptomatic cholelithiasis and right upper quadrant abdominal pain. At the time she was also in acute renal failure secondary to diarrhea. The diarrhea resolved in her renal function improved. Her creatinine on 05/27/2016 was 1.55. She has a history of atrial fibrillation and was on Eliquis prior to hospitalization. Her Elaprase. During her last hospitalization due to vaginal bleeding, but it was resumed upon discharge. She has extensive cellulitis of her left foot, particularly different first and second toes. She was discharged on clindamycin and Levaquin to be continued till 06/03/2016 for left foot cellulitis. She had heart surgery for foreign body extraction and for abscess drainage on the left foot prior to last hospitalization  A repeat CT abdomen today showed cholelithiasis, right lower lobe infiltrate suggestive of pneumonia and has CT head was negative  PAST MEDICAL HISTORY :  She  has a past medical history of Coronary atherosclerosis; DKA (diabetic ketoacidoses) (HCC); Essential hypertension; History of Bell's palsy; Hyperlipidemia; Hypertension; Hypokalemia; IDDM (insulin dependent diabetes mellitus) (HCC); Long-term insulin use (HCC); Morbid obesity (HCC); and Peripheral vascular disease (HCC).  PAST SURGICAL HISTORY: She  has a past surgical history that includes Cesarean section; Eye surgery (Right); Cataract extraction w/PHACO (Left, 02/08/2015); Incision and drainage (Left, 05/13/2016); and Foreign Body Removal (Left, 05/13/2016).  Allergies  Allergen Reactions  . Rofecoxib Other (See Comments)    Reaction: unknown  . Penicillins Rash and Other (See Comments)    Has patient had a PCN reaction causing immediate rash, facial/tongue/throat swelling, SOB or lightheadedness with hypotension: Unknown Has patient had a PCN reaction causing severe rash involving mucus membranes or skin necrosis: Unknown Has patient had a PCN reaction that required  hospitalization: Unknown  Has patient had a PCN reaction occurring within the last 10 years: Unknown If all of the above answers are "NO", then may proceed with Cephalosporin use.     No current facility-administered medications on file prior to encounter.    Current Outpatient Prescriptions on File Prior to Encounter  Medication Sig  . apixaban (ELIQUIS) 5 MG TABS tablet Take 1 tablet (5 mg total) by mouth 2 (two) times daily.  . clindamycin (CLEOCIN) 300 MG capsule Take 1 capsule (300 mg total) by mouth 4 (four) times daily.  . digoxin (LANOXIN) 0.25 MG tablet Take 1 tablet (0.25 mg total) by mouth daily.  Marland Kitchen diltiazem (CARDIZEM CD) 180 MG 24 hr capsule Take 1 capsule (180 mg total) by mouth daily.  . furosemide (LASIX) 40 MG tablet Take 1 tablet (40 mg total) by mouth daily.  Marland Kitchen HYDROcodone-acetaminophen (NORCO) 7.5-325 MG tablet Take 1 tablet by mouth every 6 (six) hours as needed for moderate pain.  . Insulin Glargine (LANTUS SOLOSTAR) 100 UNIT/ML Solostar Pen Inject 50 Units into the skin at bedtime.  Marland Kitchen levofloxacin (LEVAQUIN) 500 MG tablet Take 1 tablet (500 mg total) by mouth daily.  Marland Kitchen loperamide (IMODIUM A-D) 2 MG tablet Take 2 mg by mouth every 8 (eight) hours as needed for diarrhea or loose stools.  . metoprolol (LOPRESSOR) 50 MG tablet Take 1 tablet (50 mg total) by mouth 2 (two) times daily.  . norethindrone (AYGESTIN) 5 MG tablet Take 1 tablet (5 mg total) by mouth 3 (three) times daily.  Marland Kitchen nystatin (MYCOSTATIN/NYSTOP) powder Apply 1 g topically as needed (for rash and redness).  . ondansetron (ZOFRAN) 4 MG tablet Take 1 tablet (4 mg total) by mouth every 6 (six) hours as needed for nausea.  . rosuvastatin (CRESTOR) 10 MG tablet Take 1 tablet (10 mg total) by mouth daily after supper.  . senna-docusate (SENOKOT-S) 8.6-50 MG tablet Take 1 tablet by mouth 2 (two) times daily.    FAMILY HISTORY:  Her indicated that her mother is deceased. She indicated that her father is deceased.     SOCIAL HISTORY: She  reports that she has never smoked. She has never used smokeless tobacco. She reports that she does not drink alcohol or use drugs.  REVIEW OF SYSTEMS:   Unable to obtain as patient is currently intubated and sedated  SUBJECTIVE:   VITAL SIGNS: BP (!) 79/63   Pulse 99   Temp (!) 96.7 F (35.9 C) (Axillary)   Resp 18   Ht 5' (1.524 m)   Wt 298 lb 4.5 oz (135.3 kg)   SpO2 99%   BMI 58.25 kg/m   HEMODYNAMICS:    VENTILATOR SETTINGS: Vent Mode: PRVC FiO2 (%):  [50 %-100 %] 50 % Set Rate:  [16 bmp] 16 bmp Vt Set:  [450 mL] 450 mL PEEP:  [5 cmH20] 5 cmH20  INTAKE / OUTPUT: I/O last 3 completed shifts: In: 4750 [IV Piggyback:4750] Out: -   PHYSICAL EXAMINATION: General:  Very obese, intubated, sedated, minimally responsive, not F/C Neuro: PERRLA, EOMI, MAE's, DTRs symmetric HEENT: NCAT, sclerae white Cardiovascular:  Mildly tachycardia, irregular, no murmurs noted Lungs: No wheezes, distant breath sounds, diminished dependently Abdomen:  Profoundly obese, diminished to absent bowel sounds, nondistended, no apparent tenderness Extremities: Cool, no edema, post operative changes on left foot which is minimally edematous but not erythematous or apparently tender Skin: Intertriginous excoriations, mild diffuse mottling  LABS:  BMET  Recent Labs Lab 05/28/16 0403 05/24/2016 1503 05/20/2016 2050  NA 135  126* 127*  K 3.4* 4.7 4.8  CL 109 96* 100*  CO2 18* 11* 12*  BUN 67* 86* 85*  CREATININE 1.41* 3.42* 3.39*  GLUCOSE 200* 391* 482*    Electrolytes  Recent Labs Lab 05/28/16 0403 05/30/2016 1503 06/01/2016 2050  CALCIUM 7.2* 7.8* 7.0*  MG  --   --  2.0  PHOS  --   --  10.6*    CBC  Recent Labs Lab 05/25/16 2252 05/27/16 0643 05/14/2016 1503  WBC 3.6 2.9* 19.8*  HGB 12.0 11.7* 14.4  HCT 35.1 34.9* 44.6  PLT 101* 109* 438    Coag's  Recent Labs Lab 05/26/16 0613 05/26/16 1427  APTT 35 56*  INR 1.52  --     Sepsis  Markers  Recent Labs Lab 05/25/16 2252 05/16/2016 1503 05/15/2016 1900  LATICACIDVEN 1.6 4.8* 2.1*    ABG  Recent Labs Lab 05/28/2016 1643 05/15/2016 2050  PHART 7.13* PENDING  PCO2ART 29* 38  PO2ART 323* PENDING    Liver Enzymes  Recent Labs Lab 05/25/16 2252 05/27/16 0643 05/17/2016 1503  AST 140* 29 34  ALT 208* 89* 30  ALKPHOS 139* 121 89  BILITOT 1.5* 1.4* 3.0*  ALBUMIN 2.2* 1.9* 2.1*    Cardiac Enzymes  Recent Labs Lab 05/30/2016 1503  TROPONINI 0.04*    Glucose  Recent Labs Lab 05/27/16 1128 05/27/16 1631 05/27/16 2020 05/28/16 0730 05/28/16 1153 05/28/16 1630  GLUCAP 204* 202* 189* 195* 161* 179*    CXR: Low lung volumes, no definite infiltrates    ASSESSMENT / PLAN:  PULMONARY A: Ventilator dependent respiratory failure P:   Vent settings established Vent bundle implemented Daily SBT as indicated  CARDIOVASCULAR A:  Severe septic shock with lactic acidosis P:  MAP goal > 65 mmHg Vasopressin ordered NE titrated to above goal  RENAL A:   AKI, anuric CKD Metabolic acidosis P:   Monitor BMET intermittently Monitor I/Os Correct electrolytes as indicated Renal consult HD catheter placed and CRRT ordered  GASTROINTESTINAL A:   Morbid obesity P:   SUP: IV PPI Holding TFs until hemodynamics improved  HEMATOLOGIC A:   No acute issues P:  DVT px: SQ heparin Monitor CBC intermittently Transfuse per usual guidelines  INFECTIOUS A:   Severe sepsis with unclear source P:   Monitor temp, WBC count Micro and abx as above PCT protocol  ENDOCRINE A:   Severe hyperglycemia. History of type 2 diabetes P:   Insulin infusion  NEUROLOGIC A:   Acute metabolic encephalopathy P:   RASS goal: -1 to -2 Fentanyl infusion and PRN midazolam   FAMILY: No family available  CCM time: 60 mins The above time includes time spent in consultation with patient and/or family members and reviewing care plan on multidisciplinary  rounds  Billy Fischer, MD PCCM service Mobile [phone] Pager [phone] 05/30/2016 4:29 PM

## 2016-05-29 NOTE — ED Notes (Signed)
Lactic reported to Dr Pershing ProudSchaevitz - no new orders

## 2016-05-29 NOTE — ED Notes (Signed)
IJ not flushing appropriately - Dr Pershing ProudSchaevitz removed and will attempt central line again

## 2016-05-29 NOTE — ED Triage Notes (Signed)
Pt arrived via ems for c/o respiratory distress and altered mental status - pt is unresponsive and has shallow labored respirations

## 2016-05-29 NOTE — ED Notes (Signed)
Elevated troponin and lactic acid reported to Dr Pershing ProudSchaevitz - no new orders given

## 2016-05-29 NOTE — ED Notes (Signed)
CODE  SEPSIS  CALLED  TO  CARELINK 

## 2016-05-29 NOTE — Progress Notes (Signed)
Pharmacy Antibiotic Note  Laura Kline is a 61 y.o. female admitted on 05/14/2016 with sepsis.  Pharmacy has been consulted for vancomycin, levofloxacin, cefepime dosing. Spoke with MD and would like all three abx on board. Pt was discharged from the hospital yesterday and arrived today unresponsive. Patient is in AKI (SCr 5/21 = 1.4, SCR 5/22 = 3.4) WBC = 19.8, LA = 4.8  Plan: Begin levofloxacin 750 mg IV q 48 hours Begin cefepime 2 g IV q 24 hours  Due to AKI, will give vanc 2000 mg IV LD based on AdjBW x 1 and draw vanc random level in 24 hours  Height: 5\' 6"  (167.6 cm) Weight: 300 lb (136.1 kg) IBW/kg (Calculated) : 59.3  Temp (24hrs), Avg:98 F (36.7 C), Min:98 F (36.7 C), Max:98 F (36.7 C)   Recent Labs Lab 05/25/16 2252 05/27/16 0643 05/28/16 0403 05/19/2016 1503  WBC 3.6 2.9*  --  19.8*  CREATININE 2.67* 1.55* 1.41* 3.42*  LATICACIDVEN 1.6  --   --  4.8*    Estimated Creatinine Clearance: 24.2 mL/min (A) (by C-G formula based on SCr of 3.42 mg/dL (H)).    Allergies  Allergen Reactions  . Rofecoxib Other (See Comments)    Reaction: unknown  . Penicillins Rash and Other (See Comments)    Has patient had a PCN reaction causing immediate rash, facial/tongue/throat swelling, SOB or lightheadedness with hypotension: Unknown Has patient had a PCN reaction causing severe rash involving mucus membranes or skin necrosis: Unknown Has patient had a PCN reaction that required hospitalization: Unknown Has patient had a PCN reaction occurring within the last 10 years: Unknown If all of the above answers are "NO", then may proceed with Cephalosporin use.     Antimicrobials this admission: vanc 5/22 >>  levofloxacin 5/22 >>  Cefepime 5/22 >>  Dose adjustments this admission:  Microbiology results: 5/22 BCx: sent  UCx:    Sputum:    MRSA PCR:   Thank you for allowing pharmacy to be a part of this patient's care.  Horris LatinoHolly Gilliam, PharmD Pharmacy  Resident 06/03/2016 6:52 PM

## 2016-05-29 NOTE — ED Notes (Signed)
Central line placed right side of neck by Dr Pershing ProudSchaevitz - flushes well

## 2016-05-30 ENCOUNTER — Inpatient Hospital Stay: Payer: BLUE CROSS/BLUE SHIELD

## 2016-05-30 ENCOUNTER — Ambulatory Visit: Payer: BLUE CROSS/BLUE SHIELD | Admitting: Nurse Practitioner

## 2016-05-30 DIAGNOSIS — J9600 Acute respiratory failure, unspecified whether with hypoxia or hypercapnia: Secondary | ICD-10-CM

## 2016-05-30 DIAGNOSIS — R6521 Severe sepsis with septic shock: Secondary | ICD-10-CM

## 2016-05-30 DIAGNOSIS — N179 Acute kidney failure, unspecified: Secondary | ICD-10-CM

## 2016-05-30 DIAGNOSIS — A419 Sepsis, unspecified organism: Principal | ICD-10-CM

## 2016-05-30 LAB — GLUCOSE, CAPILLARY
GLUCOSE-CAPILLARY: 123 mg/dL — AB (ref 65–99)
GLUCOSE-CAPILLARY: 135 mg/dL — AB (ref 65–99)
GLUCOSE-CAPILLARY: 177 mg/dL — AB (ref 65–99)
GLUCOSE-CAPILLARY: 228 mg/dL — AB (ref 65–99)
GLUCOSE-CAPILLARY: 271 mg/dL — AB (ref 65–99)
GLUCOSE-CAPILLARY: 478 mg/dL — AB (ref 65–99)
GLUCOSE-CAPILLARY: 499 mg/dL — AB (ref 65–99)
Glucose-Capillary: 114 mg/dL — ABNORMAL HIGH (ref 65–99)
Glucose-Capillary: 123 mg/dL — ABNORMAL HIGH (ref 65–99)
Glucose-Capillary: 134 mg/dL — ABNORMAL HIGH (ref 65–99)
Glucose-Capillary: 135 mg/dL — ABNORMAL HIGH (ref 65–99)
Glucose-Capillary: 136 mg/dL — ABNORMAL HIGH (ref 65–99)
Glucose-Capillary: 171 mg/dL — ABNORMAL HIGH (ref 65–99)
Glucose-Capillary: 177 mg/dL — ABNORMAL HIGH (ref 65–99)
Glucose-Capillary: 184 mg/dL — ABNORMAL HIGH (ref 65–99)
Glucose-Capillary: 213 mg/dL — ABNORMAL HIGH (ref 65–99)
Glucose-Capillary: 351 mg/dL — ABNORMAL HIGH (ref 65–99)
Glucose-Capillary: 405 mg/dL — ABNORMAL HIGH (ref 65–99)
Glucose-Capillary: 423 mg/dL — ABNORMAL HIGH (ref 65–99)
Glucose-Capillary: 438 mg/dL — ABNORMAL HIGH (ref 65–99)
Glucose-Capillary: 483 mg/dL — ABNORMAL HIGH (ref 65–99)

## 2016-05-30 LAB — RENAL FUNCTION PANEL
Albumin: 1.6 g/dL — ABNORMAL LOW (ref 3.5–5.0)
Anion gap: 15 (ref 5–15)
BUN: 73 mg/dL — AB (ref 6–20)
CO2: 20 mmol/L — ABNORMAL LOW (ref 22–32)
Calcium: 7 mg/dL — ABNORMAL LOW (ref 8.9–10.3)
Chloride: 99 mmol/L — ABNORMAL LOW (ref 101–111)
Creatinine, Ser: 2.97 mg/dL — ABNORMAL HIGH (ref 0.44–1.00)
GFR calc Af Amer: 18 mL/min — ABNORMAL LOW (ref >60)
GFR calc non Af Amer: 16 mL/min — ABNORMAL LOW (ref >60)
GLUCOSE: 242 mg/dL — AB (ref 65–99)
POTASSIUM: 3.1 mmol/L — AB (ref 3.5–5.1)
Phosphorus: 6.2 mg/dL — ABNORMAL HIGH (ref 2.5–4.6)
Sodium: 134 mmol/L — ABNORMAL LOW (ref 135–145)

## 2016-05-30 LAB — BLOOD GAS, ARTERIAL
Acid-base deficit: 15.1 mmol/L — ABNORMAL HIGH (ref 0.0–2.0)
Acid-base deficit: 9 mmol/L — ABNORMAL HIGH (ref 0.0–2.0)
BICARBONATE: 18.8 mmol/L — AB (ref 20.0–28.0)
Bicarbonate: 14 mmol/L — ABNORMAL LOW (ref 20.0–28.0)
FIO2: 0.5
FIO2: 0.4
LHR: 16 {breaths}/min
MECHVT: 450 mL
O2 Saturation: 90.4 %
O2 Saturation: 96.9 %
PATIENT TEMPERATURE: 37
PATIENT TEMPERATURE: 37
PEEP/CPAP: 5 cmH2O
PEEP: 5 cmH2O
PO2 ART: 80 mmHg — AB (ref 83.0–108.0)
RATE: 16 resp/min
VT: 450 mL
pCO2 arterial: 44 mmHg (ref 32.0–48.0)
pCO2 arterial: 47 mmHg (ref 32.0–48.0)
pH, Arterial: 7.11 — CL (ref 7.350–7.450)
pH, Arterial: 7.21 — ABNORMAL LOW (ref 7.350–7.450)
pO2, Arterial: 107 mmHg (ref 83.0–108.0)

## 2016-05-30 LAB — COMPREHENSIVE METABOLIC PANEL
ALT: 29 U/L (ref 14–54)
ANION GAP: 14 (ref 5–15)
AST: 44 U/L — ABNORMAL HIGH (ref 15–41)
Albumin: 1.8 g/dL — ABNORMAL LOW (ref 3.5–5.0)
Alkaline Phosphatase: 81 U/L (ref 38–126)
BILIRUBIN TOTAL: 4 mg/dL — AB (ref 0.3–1.2)
BUN: 80 mg/dL — AB (ref 6–20)
CHLORIDE: 99 mmol/L — AB (ref 101–111)
CO2: 20 mmol/L — ABNORMAL LOW (ref 22–32)
Calcium: 6.5 mg/dL — ABNORMAL LOW (ref 8.9–10.3)
Creatinine, Ser: 3.07 mg/dL — ABNORMAL HIGH (ref 0.44–1.00)
GFR, EST AFRICAN AMERICAN: 18 mL/min — AB (ref >60)
GFR, EST NON AFRICAN AMERICAN: 15 mL/min — AB (ref >60)
Glucose, Bld: 379 mg/dL — ABNORMAL HIGH (ref 65–99)
Potassium: 3 mmol/L — ABNORMAL LOW (ref 3.5–5.1)
Sodium: 133 mmol/L — ABNORMAL LOW (ref 135–145)
TOTAL PROTEIN: 4.5 g/dL — AB (ref 6.5–8.1)

## 2016-05-30 LAB — CBC
HEMATOCRIT: 40.2 % (ref 35.0–47.0)
HEMOGLOBIN: 13.4 g/dL (ref 12.0–16.0)
MCH: 31 pg (ref 26.0–34.0)
MCHC: 33.4 g/dL (ref 32.0–36.0)
MCV: 92.9 fL (ref 80.0–100.0)
Platelets: 455 10*3/uL — ABNORMAL HIGH (ref 150–440)
RBC: 4.32 MIL/uL (ref 3.80–5.20)
RDW: 14.1 % (ref 11.5–14.5)
WBC: 10.3 10*3/uL (ref 3.6–11.0)

## 2016-05-30 LAB — MAGNESIUM
Magnesium: 1.6 mg/dL — ABNORMAL LOW (ref 1.7–2.4)
Magnesium: 1.5 mg/dL — ABNORMAL LOW (ref 1.7–2.4)

## 2016-05-30 LAB — TSH: TSH: 0.64 u[IU]/mL (ref 0.350–4.500)

## 2016-05-30 LAB — PROTIME-INR
INR: 2.55
Prothrombin Time: 27.9 seconds — ABNORMAL HIGH (ref 11.4–15.2)

## 2016-05-30 LAB — PHOSPHORUS: Phosphorus: 7.5 mg/dL — ABNORMAL HIGH (ref 2.5–4.6)

## 2016-05-30 LAB — FIBRINOGEN: FIBRINOGEN: 642 mg/dL — AB (ref 210–475)

## 2016-05-30 LAB — FIBRIN DERIVATIVES D-DIMER (ARMC ONLY): Fibrin derivatives D-dimer (ARMC): 1074.42 — ABNORMAL HIGH (ref 0.00–499.00)

## 2016-05-30 LAB — PROCALCITONIN: PROCALCITONIN: 4.24 ng/mL

## 2016-05-30 MED ORDER — SODIUM BICARBONATE 8.4 % IV SOLN
INTRAVENOUS | Status: DC
  Administered 2016-05-30 – 2016-05-31 (×3): via INTRAVENOUS
  Filled 2016-05-30 (×2): qty 150

## 2016-05-30 MED ORDER — MIDAZOLAM HCL 2 MG/2ML IJ SOLN
2.0000 mg | Freq: Once | INTRAMUSCULAR | Status: AC
  Administered 2016-05-30: 2 mg via INTRAVENOUS

## 2016-05-30 MED ORDER — VANCOMYCIN HCL IN DEXTROSE 1-5 GM/200ML-% IV SOLN
1000.0000 mg | INTRAVENOUS | Status: DC
  Administered 2016-05-30 – 2016-06-01 (×3): 1000 mg via INTRAVENOUS
  Filled 2016-05-30 (×5): qty 200

## 2016-05-30 MED ORDER — ALBUMIN HUMAN 25 % IV SOLN
12.5000 g | Freq: Three times a day (TID) | INTRAVENOUS | Status: AC
  Administered 2016-05-30 – 2016-05-31 (×3): 12.5 g via INTRAVENOUS
  Filled 2016-05-30 (×3): qty 50

## 2016-05-30 MED ORDER — INSULIN GLARGINE 100 UNIT/ML ~~LOC~~ SOLN
10.0000 [IU] | SUBCUTANEOUS | Status: DC
  Administered 2016-05-30: 10 [IU] via SUBCUTANEOUS
  Filled 2016-05-30 (×2): qty 0.1

## 2016-05-30 MED ORDER — INSULIN ASPART 100 UNIT/ML ~~LOC~~ SOLN
1.0000 [IU] | SUBCUTANEOUS | Status: DC
  Administered 2016-05-30 – 2016-05-31 (×3): 3 [IU] via SUBCUTANEOUS
  Filled 2016-05-30 (×3): qty 3

## 2016-05-30 MED ORDER — SODIUM BICARBONATE 8.4 % IV SOLN
150.0000 meq | Freq: Once | INTRAVENOUS | Status: AC
  Administered 2016-05-30: 150 meq via INTRAVENOUS
  Filled 2016-05-30: qty 150

## 2016-05-30 MED ORDER — HEPARIN SODIUM (PORCINE) 1000 UNIT/ML DIALYSIS
1000.0000 [IU] | INTRAMUSCULAR | Status: DC | PRN
  Filled 2016-05-30: qty 10

## 2016-05-30 MED ORDER — BUDESONIDE 0.25 MG/2ML IN SUSP
0.2500 mg | Freq: Four times a day (QID) | RESPIRATORY_TRACT | Status: DC
  Administered 2016-05-30 – 2016-06-06 (×29): 0.25 mg via RESPIRATORY_TRACT
  Filled 2016-05-30 (×30): qty 2

## 2016-05-30 MED ORDER — SODIUM CHLORIDE 0.9% FLUSH
10.0000 mL | Freq: Two times a day (BID) | INTRAVENOUS | Status: DC
  Administered 2016-05-30 – 2016-05-31 (×2): 10 mL
  Administered 2016-06-01: 40 mL
  Administered 2016-06-01 – 2016-06-02 (×2): 10 mL
  Administered 2016-06-02: 40 mL
  Administered 2016-06-03: 10 mL
  Administered 2016-06-03: 40 mL
  Administered 2016-06-04 – 2016-06-05 (×2): 10 mL

## 2016-05-30 MED ORDER — SODIUM CHLORIDE 0.9% FLUSH
10.0000 mL | INTRAVENOUS | Status: DC | PRN
  Administered 2016-06-03 – 2016-06-04 (×3): 10 mL
  Filled 2016-05-30 (×3): qty 40

## 2016-05-30 MED ORDER — IPRATROPIUM-ALBUTEROL 0.5-2.5 (3) MG/3ML IN SOLN: 3.0000 mL | Freq: Four times a day (QID) | RESPIRATORY_TRACT | Status: DC | PRN

## 2016-05-30 MED ORDER — DEXTROSE 5 % IV SOLN
2.0000 g | Freq: Two times a day (BID) | INTRAVENOUS | Status: DC
  Administered 2016-05-30 – 2016-06-05 (×12): 2 g via INTRAVENOUS
  Filled 2016-05-30 (×13): qty 2

## 2016-05-30 MED ORDER — HEPARIN SODIUM (PORCINE) 5000 UNIT/ML IJ SOLN
5000.0000 [IU] | Freq: Three times a day (TID) | INTRAMUSCULAR | Status: DC
  Administered 2016-05-30 – 2016-06-01 (×6): 5000 [IU] via SUBCUTANEOUS
  Filled 2016-05-30 (×6): qty 1

## 2016-05-30 MED ORDER — NOREPINEPHRINE BITARTRATE 1 MG/ML IV SOLN
0.0000 ug/min | INTRAVENOUS | Status: DC
  Administered 2016-05-30: 60 ug/min via INTRAVENOUS
  Administered 2016-05-30: 65 ug/min via INTRAVENOUS
  Administered 2016-05-30: 60 ug/min via INTRAVENOUS
  Administered 2016-05-30: 52 ug/min via INTRAVENOUS
  Administered 2016-05-31: 50 ug/min via INTRAVENOUS
  Administered 2016-05-31: 60 ug/min via INTRAVENOUS
  Administered 2016-05-31: 34 ug/min via INTRAVENOUS
  Administered 2016-06-01: 15 ug/min via INTRAVENOUS
  Administered 2016-06-01: 30 ug/min via INTRAVENOUS
  Administered 2016-06-02: 16 ug/min via INTRAVENOUS
  Administered 2016-06-02: 30 ug/min via INTRAVENOUS
  Administered 2016-06-03: 18 ug/min via INTRAVENOUS
  Administered 2016-06-04: 7 ug/min via INTRAVENOUS
  Filled 2016-05-30 (×13): qty 16

## 2016-05-30 MED ORDER — MIDAZOLAM HCL 2 MG/2ML IJ SOLN
2.0000 mg | INTRAMUSCULAR | Status: DC | PRN
  Administered 2016-05-30 – 2016-06-06 (×14): 2 mg via INTRAVENOUS
  Filled 2016-05-30 (×16): qty 2

## 2016-05-30 MED ORDER — METRONIDAZOLE IN NACL 5-0.79 MG/ML-% IV SOLN
500.0000 mg | Freq: Three times a day (TID) | INTRAVENOUS | Status: DC
  Administered 2016-05-30 – 2016-06-06 (×21): 500 mg via INTRAVENOUS
  Filled 2016-05-30 (×24): qty 100

## 2016-05-30 MED ORDER — SODIUM CHLORIDE 0.9 % IV SOLN
INTRAVENOUS | Status: DC
  Administered 2016-05-30: 5.9 [IU]/h via INTRAVENOUS
  Administered 2016-05-30: 4.2 [IU]/h via INTRAVENOUS
  Filled 2016-05-30 (×2): qty 1

## 2016-05-30 MED ORDER — SODIUM CHLORIDE 0.9 % IV BOLUS (SEPSIS): 1000.0000 mL | Freq: Once | INTRAVENOUS | Status: DC

## 2016-05-30 MED ORDER — VASOPRESSIN 20 UNIT/ML IV SOLN
0.0300 [IU]/min | INTRAVENOUS | Status: DC
  Administered 2016-05-30 – 2016-06-05 (×8): 0.03 [IU]/min via INTRAVENOUS
  Filled 2016-05-30 (×8): qty 2

## 2016-05-30 MED ORDER — HEPARIN SODIUM (PORCINE) 1000 UNIT/ML DIALYSIS: 1000.0000 [IU] | INTRAMUSCULAR | Status: DC | PRN

## 2016-05-30 MED ORDER — PUREFLOW DIALYSIS SOLUTION
INTRAVENOUS | Status: DC
  Administered 2016-05-30 – 2016-05-31 (×2): via INTRAVENOUS_CENTRAL

## 2016-05-30 MED ORDER — IPRATROPIUM-ALBUTEROL 0.5-2.5 (3) MG/3ML IN SOLN
3.0000 mL | Freq: Four times a day (QID) | RESPIRATORY_TRACT | Status: DC
  Administered 2016-05-30 – 2016-06-06 (×29): 3 mL via RESPIRATORY_TRACT
  Filled 2016-05-30 (×30): qty 3

## 2016-05-30 MED ORDER — DOXYCYCLINE HYCLATE 100 MG PO TABS
100.0000 mg | ORAL_TABLET | Freq: Two times a day (BID) | ORAL | Status: DC
  Administered 2016-05-30 – 2016-06-05 (×13): 100 mg via ORAL
  Filled 2016-05-30 (×13): qty 1

## 2016-05-30 NOTE — Progress Notes (Signed)
Dialysis catheter exchanged over wire via right IJ central line access.  Central line access obtained prior to exchange and verified with xray.  Annabelle Harmanana, NP gave verbal order that it's okay to use central line.

## 2016-05-30 NOTE — Consult Note (Signed)
[facility] Infectious Disease     Reason for Consult: Sepsis, cellulitis   Referring Physician:  Date of Admission:  05/25/2016   Active Problems:   Septic shock [facility])   HPI: Laura Kline is a 61 y.o. female admitted from rehab with SOB and found unresponsive and bradycardic.  On admission she was hypotensive and she was intubated.  She had 2 recent admission 5/5 and 5/19. On 5/6 she had I and D of foot abscess due to foreign body. Cx grew MSSA. Was dced on bactrim. Readmitted 5/19 with AKI, acute transaminitis and vaginal bleeding. She had elevated CA 125, US showed enlarged endometrial strip and had aymptomatic cholelithiasis. She was dced on levo and clinda.  Now she is intubated, septic, on pressors. Has ARF and getting HD temp cath placed.  WBC on admit was up to 19.8,  UA neg, AST and ALT nml but T bili up to 4. CT abd wo contrasat showed RLL consolidation with opacities in both LL, tiny gallstones. MRI foot 5/5 showed abscess but no osteomyelitis.   Past Medical History:  Diagnosis Date  . Coronary atherosclerosis    LAD calcifications noted on CT scan in 2016 with negative nuclear stress test.  . DKA (diabetic ketoacidoses) ([place])    a. 05/2009  . Essential hypertension   . History of Bell's palsy   . Hyperlipidemia   . Hypertension   . Hypokalemia   . IDDM (insulin dependent diabetes mellitus) ([place])   . Long-term insulin use ([place])   . Morbid obesity ([place])   . Peripheral vascular disease [facility])    Past Surgical History:  Procedure Laterality Date  . CATARACT EXTRACTION W/PHACO Left 02/08/2015   Procedure: CATARACT EXTRACTION PHACO AND INTRAOCULAR LENS PLACEMENT (IOC);  Surgeon: Birder Robson, MD;  Location: [facility] ORS;  Service: Ophthalmology;  Laterality: Left;  [place] 01:33 AP% 15.9 CDE 14.94 flyuid pack lot # [id number]  . CESAREAN SECTION     x2  . EYE SURGERY Right    Cataract Extraction with IOL  . FOREIGN BODY REMOVAL Left 05/13/2016   Procedure: REMOVAL  FOREIGN BODY EXTREMITY;  Surgeon: Albertine Patricia, DPM;  Location: [facility] ORS;  Service: Podiatry;  Laterality: Left;  . INCISION AND DRAINAGE Left 05/13/2016   Procedure: INCISION AND DRAINAGE;  Surgeon: Albertine Patricia, DPM;  Location: [facility] ORS;  Service: Podiatry;  Laterality: Left;   Social History  Substance Use Topics  . Smoking status: Never Smoker  . Smokeless tobacco: Never Used  . Alcohol use No   Family History  Problem Relation Age of Onset  . Hypertension Mother   . Hypertension Father   . CAD Father        s/p cabg  . Heart attack Father   . Heart failure Father     Allergies:  Allergies  Allergen Reactions  . Rofecoxib Other (See Comments)    Reaction: unknown  . Penicillins Rash and Other (See Comments)    Has patient had a PCN reaction causing immediate rash, facial/tongue/throat swelling, SOB or lightheadedness with hypotension: Unknown Has patient had a PCN reaction causing severe rash involving mucus membranes or skin necrosis: Unknown Has patient had a PCN reaction that required hospitalization: Unknown Has patient had a PCN reaction occurring within the last 10 years: Unknown If all of the above answers are "NO", then may proceed with Cephalosporin use.     Current antibiotics: Antibiotics Given (last 72 hours)    Date/Time Action Medication Dose Rate   05/26/2016  1545 New Bag/Given   ceFEPIme (MAXIPIME) 2 g in dextrose 5 % 50 mL IVPB 2 g 100 mL/hr   05/23/2016 1656 New Bag/Given   vancomycin (VANCOCIN) IVPB 1000 mg/200 mL premix 1,000 mg 200 mL/hr   06/03/2016 2121 New Bag/Given   vancomycin (VANCOCIN) IVPB 1000 mg/200 mL premix 1,000 mg 200 mL/hr   05/09/2016 2207 New Bag/Given   levofloxacin (LEVAQUIN) IVPB 750 mg 750 mg 100 mL/hr   05/30/16 1619 New Bag/Given   ceFEPIme (MAXIPIME) 2 g in dextrose 5 % 50 mL IVPB 2 g 100 mL/hr      MEDICATIONS: . budesonide (PULMICORT) nebulizer solution  0.25 mg Nebulization Q6H  . chlorhexidine gluconate (MEDLINE  KIT)  15 mL Mouth Rinse BID  . feeding supplement (PRO-STAT SUGAR FREE 64)  30 mL Per Tube BID  . feeding supplement (VITAL HIGH PROTEIN)  1,000 mL Per Tube Q24H  . heparin subcutaneous  5,000 Units Subcutaneous Q8H  . ipratropium-albuterol  3 mL Nebulization Q6H  . mouth rinse  15 mL Mouth Rinse 10 times per day  . pantoprazole (PROTONIX) IV  40 mg Intravenous Q24H  . sodium chloride flush  10-40 mL Intracatheter Q12H    Review of Systems - unable to obtain   OBJECTIVE: Temp:  [96.7 F (35.9 C)-98.2 F (36.8 C)] 97.9 F (36.6 C) (05/23 1215) Pulse Rate:  [34-132] 95 (05/23 1515) Resp:  [10-26] 10 (05/23 1600) BP: (59-120)/(19-96) 112/86 (05/23 1600) SpO2:  [92 %-100 %] 100 % (05/23 1515) FiO2 (%):  [40 %-50 %] 40 % (05/23 1136) Weight:  [135.3 kg (298 lb 4.5 oz)-135.5 kg (298 lb 11.6 oz)] 135.5 kg (298 lb 11.6 oz) (05/23 0500) Physical Exam  Constitutional:  moridly obese, intubated sedated. R neck IJ line wnl  HENT: [place]/AT, PERRLA, no scleral icterus Mouth/Throat: ETT in place Cardiovascular: Normal rate, regular rhythm and normal heart sounds.tachy Pulmonary/Chest: mechanical BS, rhonci hil  Neck = supple, no nuchal rigidity Abdominal: Soft. Obese, large pannys Bowel sounds are normal.  exhibits no distension. There is no tenderness.  Lymphadenopathy: no cervical adenopathy. No axillary adenopathy Neurological:sedated Ext L foot with inciison site from recent I and D with no edema, erythema nor drainage  Skin: + brusing  Psychiatric: intubated  LABS: Results for orders placed or performed during the hospital encounter of 06/05/2016 (from the past 48 hour(s))  Comprehensive metabolic panel     Status: Abnormal   Collection Time: 05/15/2016  3:03 PM  Result Value Ref Range   Sodium 126 (L) 135 - 145 mmol/L   Potassium 4.7 3.5 - 5.1 mmol/L   Chloride 96 (L) 101 - 111 mmol/L   CO2 11 (L) 22 - 32 mmol/L   Glucose, Bld 391 (H) 65 - 99 mg/dL   BUN 86 (H) 6 - 20 mg/dL    Creatinine, Ser 3.42 (H) 0.44 - 1.00 mg/dL   Calcium 7.8 (L) 8.9 - 10.3 mg/dL   Total Protein 5.3 (L) 6.5 - 8.1 g/dL   Albumin 2.1 (L) 3.5 - 5.0 g/dL   AST 34 15 - 41 U/L   ALT 30 14 - 54 U/L   Alkaline Phosphatase 89 38 - 126 U/L   Total Bilirubin 3.0 (H) 0.3 - 1.2 mg/dL   GFR calc non Af Amer 13 (L) >60 mL/min   GFR calc Af Amer 15 (L) >60 mL/min    Comment: (NOTE) The eGFR has been calculated using the CKD EPI equation. This calculation has not been validated in all  clinical situations. eGFR's persistently <60 mL/min signify possible Chronic Kidney Disease.    Anion gap 19 (H) 5 - 15  CBC WITH DIFFERENTIAL     Status: Abnormal   Collection Time: 05/27/2016  3:03 PM  Result Value Ref Range   WBC 19.8 (H) 3.6 - 11.0 K/uL   RBC 4.67 3.80 - 5.20 MIL/uL   Hemoglobin 14.4 12.0 - 16.0 g/dL   HCT 44.6 35.0 - 47.0 %   MCV 95.5 80.0 - 100.0 fL   MCH 30.8 26.0 - 34.0 pg   MCHC 32.2 32.0 - 36.0 g/dL   RDW 14.3 11.5 - 14.5 %   Platelets 438 150 - 440 K/uL    Comment: PLATELET CLUMPS NOTED ON SMEAR, COUNT APPEARS ADEQUATE GIANT PLATELETS SEEN    Neutrophils Relative % 71 %   Lymphocytes Relative 11 %   Monocytes Relative 18 %   Eosinophils Relative 0 %   Basophils Relative 0 %   Neutro Abs 14.0 (H) 1.4 - 6.5 K/uL   Lymphs Abs 2.2 1.0 - 3.6 K/uL   Monocytes Absolute 3.6 (H) 0.2 - 0.9 K/uL   Eosinophils Absolute 0.0 0 - 0.7 K/uL   Basophils Absolute 0.0 0 - 0.1 K/uL   Smear Review MORPHOLOGY UNREMARKABLE   Blood Culture (routine x 2)     Status: None (Preliminary result)   Collection Time: 06/02/2016  3:03 PM  Result Value Ref Range   Specimen Description BLOOD L AC    Special Requests BOTTLES DRAWN AEROBIC AND ANAEROBIC BCAV    Culture NO GROWTH < 24 HOURS    Report Status PENDING   Lactic acid, plasma     Status: Abnormal   Collection Time: 05/16/2016  3:03 PM  Result Value Ref Range   Lactic Acid, Venous 4.8 (HH) 0.5 - 1.9 mmol/L    Comment: CRITICAL RESULT CALLED TO, READ BACK  BY AND VERIFIED WITH TERESA HUDSON AT 1558 ON 05/24/2016 JJB   Troponin I     Status: Abnormal   Collection Time: 06/01/2016  3:03 PM  Result Value Ref Range   Troponin I 0.04 (HH) <0.03 ng/mL    Comment: CRITICAL RESULT CALLED TO, READ BACK BY AND VERIFIED WITH TERESA HUDSON AT 1558 ON 05/16/2016 JJB   Lipase, blood     Status: None   Collection Time: 05/09/2016  3:03 PM  Result Value Ref Range   Lipase 16 11 - 51 U/L  Blood Culture (routine x 2)     Status: None (Preliminary result)   Collection Time: 05/12/2016  4:42 PM  Result Value Ref Range   Specimen Description BLOOD RIGHT NECK    Special Requests      BOTTLES DRAWN AEROBIC AND ANAEROBIC Blood Culture results may not be optimal due to an excessive volume of blood received in culture bottles   Culture NO GROWTH < 24 HOURS    Report Status PENDING   Blood gas, arterial ([facility], [facility], [facility])     Status: Abnormal   Collection Time: 06/07/2016  4:43 PM  Result Value Ref Range   FIO2 1.00    Delivery systems VENTILATOR    Mode PRESSURE REGULATED VOLUME CONTROL    VT 450 mL   LHR 16 resp/min   Peep/cpap 5.0 cm H20   pH, Arterial 7.13 (LL) 7.350 - 7.450    Comment: CRITICAL RESULT CALLED TO, READ BACK BY AND VERIFIED WITH: NOTIFIED DR.SCHAEVITZ, D AT 1650 ON 05/15/2016 BY GKM    pCO2 arterial 29 (L)  32.0 - 48.0 mmHg   pO2, Arterial 323 (H) 83.0 - 108.0 mmHg   Bicarbonate 9.6 (L) 20.0 - 28.0 mmol/L   Acid-base deficit 18.2 (H) 0.0 - 2.0 mmol/L   O2 Saturation 99.9 %   Patient temperature 37.0    Collection site LEFT BRACHIAL    Sample type ARTERIAL DRAW    Allens test (pass/fail) POSITIVE (A) PASS  Urinalysis, Complete w Microscopic     Status: Abnormal   Collection Time: 05/24/2016  5:00 PM  Result Value Ref Range   Color, Urine AMBER (A) YELLOW    Comment: BIOCHEMICALS MAY BE AFFECTED BY COLOR   APPearance CLOUDY (A) CLEAR   Specific Gravity, Urine 1.017 1.005 - 1.030   pH 5.0 5.0 - 8.0   Glucose, UA 50 (A) NEGATIVE mg/dL   Hgb urine  dipstick MODERATE (A) NEGATIVE   Bilirubin Urine NEGATIVE NEGATIVE   Ketones, ur NEGATIVE NEGATIVE mg/dL   Protein, ur 30 (A) NEGATIVE mg/dL   Nitrite NEGATIVE NEGATIVE   Leukocytes, UA NEGATIVE NEGATIVE   RBC / HPF TOO NUMEROUS TO COUNT 0 - 5 RBC/hpf   WBC, UA 0-5 0 - 5 WBC/hpf   Bacteria, UA NONE SEEN NONE SEEN   Squamous Epithelial / LPF 6-30 (A) NONE SEEN   Mucous PRESENT    Budding Yeast PRESENT    Hyaline Casts, UA PRESENT   Lactic acid, plasma     Status: Abnormal   Collection Time: 05/27/2016  7:00 PM  Result Value Ref Range   Lactic Acid, Venous 2.1 (HH) 0.5 - 1.9 mmol/L    Comment: CRITICAL RESULT CALLED TO, READ BACK BY AND VERIFIED WITH TERESA HUDSON AT 1934 ON 06/05/2016  JJB   Glucose, capillary     Status: Abnormal   Collection Time: 06/01/2016  8:30 PM  Result Value Ref Range   Glucose-Capillary 423 (H) 65 - 99 mg/dL  MRSA PCR Screening     Status: None   Collection Time: 05/19/2016  8:35 PM  Result Value Ref Range   MRSA by PCR NEGATIVE NEGATIVE    Comment:        The GeneXpert MRSA Assay (FDA approved for NASAL specimens only), is one component of a comprehensive MRSA colonization surveillance program. It is not intended to diagnose MRSA infection nor to guide or monitor treatment for MRSA infections.   Procalcitonin - Baseline     Status: None   Collection Time: 05/18/2016  8:50 PM  Result Value Ref Range   Procalcitonin 2.26 ng/mL    Comment:        Interpretation: PCT > 2 ng/mL: Systemic infection (sepsis) is likely, unless other causes are known. (NOTE)         ICU PCT Algorithm               Non ICU PCT Algorithm    ----------------------------     ------------------------------         PCT < 0.25 ng/mL                 PCT < 0.1 ng/mL     Stopping of antibiotics            Stopping of antibiotics       strongly encouraged.               strongly encouraged.    ----------------------------     ------------------------------       PCT level decrease  by  PCT < 0.25 ng/mL       >= 80% from peak PCT       OR PCT 0.25 - 0.5 ng/mL          Stopping of antibiotics                                             encouraged.     Stopping of antibiotics           encouraged.    ----------------------------     ------------------------------       PCT level decrease by              PCT >= 0.25 ng/mL       < 80% from peak PCT        AND PCT >= 0.5 ng/mL            Continuing antibiotics                                               encouraged.       Continuing antibiotics            encouraged.    ----------------------------     ------------------------------     PCT level increase compared          PCT > 0.5 ng/mL         with peak PCT AND          PCT >= 0.5 ng/mL             Escalation of antibiotics                                          strongly encouraged.      Escalation of antibiotics        strongly encouraged.   Magnesium     Status: None   Collection Time: 06/04/2016  8:50 PM  Result Value Ref Range   Magnesium 2.0 1.7 - 2.4 mg/dL  Phosphorus     Status: Abnormal   Collection Time: 05/12/2016  8:50 PM  Result Value Ref Range   Phosphorus 10.6 (H) 2.5 - 4.6 mg/dL  Blood gas, arterial     Status: Abnormal   Collection Time: 05/28/2016  8:50 PM  Result Value Ref Range   FIO2 0.50    Delivery systems VENTILATOR    Mode PRESSURE REGULATED VOLUME CONTROL    VT 450 mL   LHR 16 resp/min   Peep/cpap 5.0 cm H20   pH, Arterial 7.00 (LL) 7.350 - 7.450    Comment: CRITICAL RESULT, NOTIFIED PHYSICIAN TUKOV NP 05/16/2016 2105 CMM    pCO2 arterial 38 32.0 - 48.0 mmHg   pO2, Arterial 98 83.0 - 108.0 mmHg   Bicarbonate 9.4 (L) 20.0 - 28.0 mmol/L   Acid-base deficit 21.4 (H) 0.0 - 2.0 mmol/L   O2 Saturation 92.7 %   Patient temperature 37.0    Collection site RIGHT RADIAL    Sample type ARTERIAL DRAW    Allens test (pass/fail) PASS PASS  Basic metabolic panel     Status: Abnormal   Collection Time: 06/07/2016  8:50 PM  Result  Value Ref Range   Sodium 127 (L) 135 - 145 mmol/L   Potassium 4.8 3.5 - 5.1 mmol/L   Chloride 100 (L) 101 - 111 mmol/L   CO2 12 (L) 22 - 32 mmol/L   Glucose, Bld 482 (H) 65 - 99 mg/dL   BUN 85 (H) 6 - 20 mg/dL   Creatinine, Ser 3.39 (H) 0.44 - 1.00 mg/dL   Calcium 7.0 (L) 8.9 - 10.3 mg/dL   GFR calc non Af Amer 14 (L) >60 mL/min   GFR calc Af Amer 16 (L) >60 mL/min    Comment: (NOTE) The eGFR has been calculated using the CKD EPI equation. This calculation has not been validated in all clinical situations. eGFR's persistently <60 mL/min signify possible Chronic Kidney Disease.    Anion gap 15 5 - 15  Amylase     Status: None   Collection Time: 05/13/2016  8:50 PM  Result Value Ref Range   Amylase 71 28 - 100 U/L  Protime-INR     Status: Abnormal   Collection Time: 05/16/2016  9:29 PM  Result Value Ref Range   Prothrombin Time 27.7 (H) 11.4 - 15.2 seconds   INR 2.53   Glucose, capillary     Status: Abnormal   Collection Time: 05/30/16 12:11 AM  Result Value Ref Range   Glucose-Capillary 499 (H) 65 - 99 mg/dL   Comment 1 Notify RN   Blood gas, arterial     Status: Abnormal   Collection Time: 05/30/16  1:52 AM  Result Value Ref Range   FIO2 0.50    Delivery systems VENTILATOR    Mode PRESSURE REGULATED VOLUME CONTROL    VT 450 mL   LHR 16 resp/min   Peep/cpap 5.0 cm H20   pH, Arterial 7.11 (LL) 7.350 - 7.450    Comment: RBV TUKOV, CMM 0215     pCO2 arterial 44 32.0 - 48.0 mmHg   pO2, Arterial 80 (L) 83.0 - 108.0 mmHg   Bicarbonate 14.0 (L) 20.0 - 28.0 mmol/L   Acid-base deficit 15.1 (H) 0.0 - 2.0 mmol/L   O2 Saturation 90.4 %   Patient temperature 37.0    Collection site RIGHT RADIAL    Sample type ARTERIAL DRAW    Allens test (pass/fail) PASS PASS  Glucose, capillary     Status: Abnormal   Collection Time: 05/30/16  2:21 AM  Result Value Ref Range   Glucose-Capillary 483 (H) 65 - 99 mg/dL  Culture, respiratory (NON-Expectorated)     Status: None (Preliminary  result)   Collection Time: 05/30/16  2:45 AM  Result Value Ref Range   Specimen Description TRACHEAL ASPIRATE    Special Requests NONE    Gram Stain      FEW WBC PRESENT, PREDOMINANTLY MONONUCLEAR MODERATE YEAST FEW GRAM POSITIVE COCCI RARE GRAM VARIABLE ROD Performed at [facility], [address], [place], [place] [address]    Culture PENDING    Report Status PENDING   Glucose, capillary     Status: Abnormal   Collection Time: 05/30/16  3:24 AM  Result Value Ref Range   Glucose-Capillary 478 (H) 65 - 99 mg/dL  Blood gas, arterial     Status: Abnormal   Collection Time: 05/30/16  4:31 AM  Result Value Ref Range   FIO2 0.40    Delivery systems VENTILATOR    Mode PRESSURE REGULATED VOLUME CONTROL    VT 450 mL   LHR 16 resp/min   Peep/cpap 5.0 cm H20  pH, Arterial 7.21 (L) 7.350 - 7.450   pCO2 arterial 47 32.0 - 48.0 mmHg   pO2, Arterial 107 83.0 - 108.0 mmHg   Bicarbonate 18.8 (L) 20.0 - 28.0 mmol/L   Acid-base deficit 9.0 (H) 0.0 - 2.0 mmol/L   O2 Saturation 96.9 %   Patient temperature 37.0    Collection site RIGHT RADIAL    Sample type ARTERIAL DRAW    Allens test (pass/fail) PASS PASS  Glucose, capillary     Status: Abnormal   Collection Time: 05/30/16  4:33 AM  Result Value Ref Range   Glucose-Capillary 405 (H) 65 - 99 mg/dL  Procalcitonin     Status: None   Collection Time: 05/30/16  5:29 AM  Result Value Ref Range   Procalcitonin 4.24 ng/mL    Comment:        Interpretation: PCT > 2 ng/mL: Systemic infection (sepsis) is likely, unless other causes are known. (NOTE)         ICU PCT Algorithm               Non ICU PCT Algorithm    ----------------------------     ------------------------------         PCT < 0.25 ng/mL                 PCT < 0.1 ng/mL     Stopping of antibiotics            Stopping of antibiotics       strongly encouraged.               strongly encouraged.    ----------------------------     ------------------------------       PCT  level decrease by               PCT < 0.25 ng/mL       >= 80% from peak PCT       OR PCT 0.25 - 0.5 ng/mL          Stopping of antibiotics                                             encouraged.     Stopping of antibiotics           encouraged.    ----------------------------     ------------------------------       PCT level decrease by              PCT >= 0.25 ng/mL       < 80% from peak PCT        AND PCT >= 0.5 ng/mL            Continuing antibiotics                                               encouraged.       Continuing antibiotics            encouraged.    ----------------------------     ------------------------------     PCT level increase compared          PCT > 0.5 ng/mL         with peak PCT AND  PCT >= 0.5 ng/mL             Escalation of antibiotics                                          strongly encouraged.      Escalation of antibiotics        strongly encouraged.   Magnesium     Status: Abnormal   Collection Time: 05/30/16  5:29 AM  Result Value Ref Range   Magnesium 1.6 (L) 1.7 - 2.4 mg/dL  Phosphorus     Status: Abnormal   Collection Time: 05/30/16  5:29 AM  Result Value Ref Range   Phosphorus 7.5 (H) 2.5 - 4.6 mg/dL  Comprehensive metabolic panel     Status: Abnormal   Collection Time: 05/30/16  5:29 AM  Result Value Ref Range   Sodium 133 (L) 135 - 145 mmol/L   Potassium 3.0 (L) 3.5 - 5.1 mmol/L   Chloride 99 (L) 101 - 111 mmol/L   CO2 20 (L) 22 - 32 mmol/L   Glucose, Bld 379 (H) 65 - 99 mg/dL   BUN 80 (H) 6 - 20 mg/dL   Creatinine, Ser 3.07 (H) 0.44 - 1.00 mg/dL   Calcium 6.5 (L) 8.9 - 10.3 mg/dL   Total Protein 4.5 (L) 6.5 - 8.1 g/dL   Albumin 1.8 (L) 3.5 - 5.0 g/dL   AST 44 (H) 15 - 41 U/L   ALT 29 14 - 54 U/L   Alkaline Phosphatase 81 38 - 126 U/L   Total Bilirubin 4.0 (H) 0.3 - 1.2 mg/dL   GFR calc non Af Amer 15 (L) >60 mL/min   GFR calc Af Amer 18 (L) >60 mL/min    Comment: (NOTE) The eGFR has been calculated using the CKD EPI  equation. This calculation has not been validated in all clinical situations. eGFR's persistently <60 mL/min signify possible Chronic Kidney Disease.    Anion gap 14 5 - 15  CBC     Status: Abnormal   Collection Time: 05/30/16  5:29 AM  Result Value Ref Range   WBC 10.3 3.6 - 11.0 K/uL   RBC 4.32 3.80 - 5.20 MIL/uL   Hemoglobin 13.4 12.0 - 16.0 g/dL   HCT 40.2 35.0 - 47.0 %   MCV 92.9 80.0 - 100.0 fL   MCH 31.0 26.0 - 34.0 pg   MCHC 33.4 32.0 - 36.0 g/dL   RDW 14.1 11.5 - 14.5 %   Platelets 455 (H) 150 - 440 K/uL  Protime-INR     Status: Abnormal   Collection Time: 05/30/16  5:29 AM  Result Value Ref Range   Prothrombin Time 27.9 (H) 11.4 - 15.2 seconds   INR 2.55   Fibrin derivatives D-Dimer ([facility] only)     Status: Abnormal   Collection Time: 05/30/16  5:29 AM  Result Value Ref Range   Fibrin derivatives D-dimer ([facility]) 1,074.42 (H) 0.00 - 499.00    Comment: (NOTE) <> Exclusion of Venous Thromboembolism (VTE) - OUTPATIENT ONLY   (Emergency Department or [place])   0-499 ng/ml (FEU): With a low to intermediate pretest probability                      for VTE this test result excludes the diagnosis  of VTE.   >499 ng/ml (FEU) : VTE not excluded; additional work up for VTE is                      required. <> Testing on Inpatients and Evaluation of Disseminated Intravascular   Coagulation (DIC) Reference Range:   0-499 ng/ml (FEU)   Fibrinogen     Status: Abnormal   Collection Time: 05/30/16  5:29 AM  Result Value Ref Range   Fibrinogen 642 (H) 210 - 475 mg/dL  TSH     Status: None   Collection Time: 05/30/16  5:29 AM  Result Value Ref Range   TSH 0.640 0.350 - 4.500 uIU/mL    Comment: Performed by a 3rd Generation assay with a functional sensitivity of <=0.01 uIU/mL.  Glucose, capillary     Status: Abnormal   Collection Time: 05/30/16  5:36 AM  Result Value Ref Range   Glucose-Capillary 438 (H) 65 - 99 mg/dL  Glucose, capillary     Status: Abnormal    Collection Time: 05/30/16  6:40 AM  Result Value Ref Range   Glucose-Capillary 351 (H) 65 - 99 mg/dL  Glucose, capillary     Status: Abnormal   Collection Time: 05/30/16  8:12 AM  Result Value Ref Range   Glucose-Capillary 271 (H) 65 - 99 mg/dL  Glucose, capillary     Status: Abnormal   Collection Time: 05/30/16  9:15 AM  Result Value Ref Range   Glucose-Capillary 228 (H) 65 - 99 mg/dL  Glucose, capillary     Status: Abnormal   Collection Time: 05/30/16 10:20 AM  Result Value Ref Range   Glucose-Capillary 177 (H) 65 - 99 mg/dL  Glucose, capillary     Status: Abnormal   Collection Time: 05/30/16 11:23 AM  Result Value Ref Range   Glucose-Capillary 177 (H) 65 - 99 mg/dL  Glucose, capillary     Status: Abnormal   Collection Time: 05/30/16 12:17 PM  Result Value Ref Range   Glucose-Capillary 171 (H) 65 - 99 mg/dL  Glucose, capillary     Status: Abnormal   Collection Time: 05/30/16  2:08 PM  Result Value Ref Range   Glucose-Capillary 135 (H) 65 - 99 mg/dL  Glucose, capillary     Status: Abnormal   Collection Time: 05/30/16  3:27 PM  Result Value Ref Range   Glucose-Capillary 114 (H) 65 - 99 mg/dL   No components found for: ESR, C REACTIVE PROTEIN MICRO: Recent Results (from the past 720 hour(s))  Blood Culture (routine x 2)     Status: None   Collection Time: 05/12/16 11:57 AM  Result Value Ref Range Status   Specimen Description BLOOD RIGHT ASSIST CONTROL  Final   Special Requests   Final    BOTTLES DRAWN AEROBIC AND ANAEROBIC Blood Culture adequate volume   Culture NO GROWTH 5 DAYS  Final   Report Status 05/17/2016 FINAL  Final  Blood Culture (routine x 2)     Status: None   Collection Time: 05/12/16 11:57 AM  Result Value Ref Range Status   Specimen Description BLOOD LEFT ARM  Final   Special Requests   Final    BOTTLES DRAWN AEROBIC AND ANAEROBIC Blood Culture results may not be optimal due to an excessive volume of blood received in culture bottles   Culture NO  GROWTH 5 DAYS  Final   Report Status 05/17/2016 FINAL  Final  Aerobic/Anaerobic Culture (surgical/deep wound)     Status: None  Collection Time: 05/13/16  8:38 AM  Result Value Ref Range Status   Specimen Description WOUND LEFT FOOT  Final   Special Requests NONE  Final   Gram Stain   Final    MODERATE WBC PRESENT, PREDOMINANTLY PMN ABUNDANT GRAM POSITIVE COCCI IN PAIRS IN CLUSTERS Performed at [facility], [address] [place], [place] [address]    Culture   Final    MODERATE STAPHYLOCOCCUS AUREUS ABUNDANT PEPTOSTREPTOCOCCUS SPECIES    Report Status 05/20/2016 FINAL  Final   Organism ID, Bacteria STAPHYLOCOCCUS AUREUS  Final      Susceptibility   Staphylococcus aureus - MIC*    CIPROFLOXACIN <=0.5 SENSITIVE Sensitive     ERYTHROMYCIN >=8 RESISTANT Resistant     GENTAMICIN <=0.5 SENSITIVE Sensitive     OXACILLIN <=0.25 SENSITIVE Sensitive     TETRACYCLINE <=1 SENSITIVE Sensitive     VANCOMYCIN <=0.5 SENSITIVE Sensitive     TRIMETH/SULFA <=10 SENSITIVE Sensitive     CLINDAMYCIN <=0.25 SENSITIVE Sensitive     RIFAMPIN <=0.5 SENSITIVE Sensitive     Inducible Clindamycin NEGATIVE Sensitive     * MODERATE STAPHYLOCOCCUS AUREUS  Urine culture     Status: None   Collection Time: 05/13/16  5:39 PM  Result Value Ref Range Status   Specimen Description URINE, CLEAN CATCH  Final   Special Requests NONE  Final   Culture   Final    NO GROWTH Performed at [facility], [place] [address], [place], [place] [address]    Report Status 05/15/2016 FINAL  Final  MRSA PCR Screening     Status: None   Collection Time: 05/26/16  6:40 AM  Result Value Ref Range Status   MRSA by PCR NEGATIVE NEGATIVE Final    Comment:        The GeneXpert MRSA Assay (FDA approved for NASAL specimens only), is one component of a comprehensive MRSA colonization surveillance program. It is not intended to diagnose MRSA infection nor to guide or monitor treatment for MRSA infections.   C  difficile quick scan w PCR reflex     Status: None   Collection Time: 05/26/16  1:30 PM  Result Value Ref Range Status   C Diff antigen NEGATIVE NEGATIVE Final   C Diff toxin NEGATIVE NEGATIVE Final   C Diff interpretation No C. difficile detected.  Final  Gastrointestinal Panel by PCR , Stool     Status: None   Collection Time: 05/26/16  6:19 PM  Result Value Ref Range Status   Campylobacter species NOT DETECTED NOT DETECTED Final   Plesimonas shigelloides NOT DETECTED NOT DETECTED Final   Salmonella species NOT DETECTED NOT DETECTED Final   Yersinia enterocolitica NOT DETECTED NOT DETECTED Final   Vibrio species NOT DETECTED NOT DETECTED Final   Vibrio cholerae NOT DETECTED NOT DETECTED Final   Enteroaggregative E coli (EAEC) NOT DETECTED NOT DETECTED Final   Enteropathogenic E coli (EPEC) NOT DETECTED NOT DETECTED Final   Enterotoxigenic E coli (ETEC) NOT DETECTED NOT DETECTED Final   Shiga like toxin producing E coli (STEC) NOT DETECTED NOT DETECTED Final   Shigella/Enteroinvasive E coli (EIEC) NOT DETECTED NOT DETECTED Final   Cryptosporidium NOT DETECTED NOT DETECTED Final   Cyclospora cayetanensis NOT DETECTED NOT DETECTED Final   Entamoeba histolytica NOT DETECTED NOT DETECTED Final   Giardia lamblia NOT DETECTED NOT DETECTED Final   Adenovirus F40/41 NOT DETECTED NOT DETECTED Final   Astrovirus NOT DETECTED NOT DETECTED Final   Norovirus  GI/GII NOT DETECTED NOT DETECTED Final   Rotavirus A NOT DETECTED NOT DETECTED Final   Sapovirus (I, II, IV, and V) NOT DETECTED NOT DETECTED Final  Blood Culture (routine x 2)     Status: None (Preliminary result)   Collection Time: 05/27/2016  3:03 PM  Result Value Ref Range Status   Specimen Description BLOOD L AC  Final   Special Requests BOTTLES DRAWN AEROBIC AND ANAEROBIC BCAV  Final   Culture NO GROWTH < 24 HOURS  Final   Report Status PENDING  Incomplete  Blood Culture (routine x 2)     Status: None (Preliminary result)    Collection Time: 05/24/2016  4:42 PM  Result Value Ref Range Status   Specimen Description BLOOD RIGHT NECK  Final   Special Requests   Final    BOTTLES DRAWN AEROBIC AND ANAEROBIC Blood Culture results may not be optimal due to an excessive volume of blood received in culture bottles   Culture NO GROWTH < 24 HOURS  Final   Report Status PENDING  Incomplete  MRSA PCR Screening     Status: None   Collection Time: 05/28/2016  8:35 PM  Result Value Ref Range Status   MRSA by PCR NEGATIVE NEGATIVE Final    Comment:        The GeneXpert MRSA Assay (FDA approved for NASAL specimens only), is one component of a comprehensive MRSA colonization surveillance program. It is not intended to diagnose MRSA infection nor to guide or monitor treatment for MRSA infections.   Culture, respiratory (NON-Expectorated)     Status: None (Preliminary result)   Collection Time: 05/30/16  2:45 AM  Result Value Ref Range Status   Specimen Description TRACHEAL ASPIRATE  Final   Special Requests NONE  Final   Gram Stain   Final    FEW WBC PRESENT, PREDOMINANTLY MONONUCLEAR MODERATE YEAST FEW GRAM POSITIVE COCCI RARE GRAM VARIABLE ROD Performed at [facility], [place] [address] [place], [place] [address]    Culture PENDING  Incomplete   Report Status PENDING  Incomplete    IMAGING: Ct Abdomen Pelvis Wo Contrast  Result Date: 05/19/2016 CLINICAL DATA:  Unresponsive.  Septic shock. EXAM: CT ABDOMEN AND PELVIS WITHOUT CONTRAST TECHNIQUE: Multidetector CT imaging of the abdomen and pelvis was performed following the standard protocol without IV contrast. COMPARISON:  Right upper quadrant and pelvic ultrasounds 05/26/2016 FINDINGS: Lower chest: Partially visualized right lower lobe consolidation with more patchy opacities elsewhere in both lower lobes more inferiorly. Likely trace bilateral pleural effusions, with evaluation limited by streak artifact. Partially visualize coronary artery atherosclerosis.  Hepatobiliary: No focal liver abnormality is seen. Tiny stones layer in the gallbladder without evidence of wall thickening or pericholecystic inflammatory change. No biliary dilatation. Pancreas: Mild diffuse atrophy. Punctate calcifications at the level of the inferior pancreatic head which may be parenchymal (such as from past pancreatitis) or vascular. No pancreatic or biliary ductal dilatation to strongly suggest that these are ductal calculi. No peripancreatic inflammation. Spleen: Unremarkable. Adrenals/Urinary Tract: Unremarkable adrenal glands. No evidence of renal mass, calculi, or hydronephrosis. Bladder decompressed by a Foley catheter. Stomach/Bowel: Enteric tube terminates in the distal stomach. A moderate amount of fluid is present in the stomach. Fluid is present in scattered loops of nondilated small and large bowel including in the rectum. There is no evidence of bowel obstruction, and no gross bowel wall thickening is identified. The appendix is unremarkable. Vascular/Lymphatic: Abdominal aortic atherosclerosis without aneurysm. No enlarged lymph nodes. Reproductive: Small  calcification in the uterine fundus, otherwise unremarkable appearance of the uterus and ovaries. Other: No intraperitoneal free fluid. Small fat containing umbilical hernia. Abdominal wall scarring in the midline inferior to the umbilicus. Musculoskeletal: Advanced thoracic and lumbar spine disc degeneration and advanced lumbar facet arthropathy. IMPRESSION: 1. Bibasilar pulmonary airspace opacities including partially visualized right lower lobe consolidation which may reflect pneumonia. 2. No definite acute abnormality identified in the abdomen or pelvis. 3. Cholelithiasis. 4. Other incidental findings as above. 5.  Aortic Atherosclerosis (ICD10-I70.0). Electronically Signed   By: Logan Bores M.D.   On: 06/02/2016 18:03   Dg Chest 1 View  Result Date: 05/14/2016 CLINICAL DATA:  Central line EXAM: CHEST 1 VIEW COMPARISON:   05/13/2016 FINDINGS: Repositioned endotracheal tube, the tip is approximately 4.2 cm superior to the carina. Esophageal tube tip is below the diaphragm. Right IJ central venous catheter tip faintly visualized over the proximal right atrium. No pneumothorax. Cardiomegaly with mild central congestion. Patchy perihilar and basilar opacity. IMPRESSION: 1. Repositioning of endotracheal tube 2. Right IJ central venous catheter tip overlies proximal right atrium, no pneumothorax 3. Low lung volumes. Slight increased patchy perihilar opacity compared to prior which may reflect increasing atelectasis or vascular congestion. Electronically Signed   By: Donavan Foil M.D.   On: 05/25/2016 17:09   Ct Head Wo Contrast  Result Date: 05/23/2016 CLINICAL DATA:  Unresponsive EXAM: CT HEAD WITHOUT CONTRAST TECHNIQUE: Contiguous axial images were obtained from the base of the skull through the vertex without intravenous contrast. COMPARISON:  None. FINDINGS: Brain: No acute territorial infarction, hemorrhage or intracranial mass is seen. Prominent extra-axial CSF spaces anteriorly likely related to atrophy. Ventricles are nonenlarged. Vascular: No hyperdense vessels. Carotid artery calcification and vertebral artery calcification. Skull: No fracture or suspicious bone lesion Sinuses/Orbits: Mucosal thickening in the maxillary and ethmoid sinuses. No acute orbital abnormality. Bilateral lens extraction. Other: None IMPRESSION: No CT evidence for acute intracranial abnormality. Electronically Signed   By: Donavan Foil M.D.   On: 05/30/2016 17:52   US Transvaginal Non-ob  Result Date: 05/26/2016 CLINICAL DATA:  Postmenopausal bleeding.  Patient not on tamoxifen. EXAM: TRANSABDOMINAL AND TRANSVAGINAL ULTRASOUND OF PELVIS TECHNIQUE: Both transabdominal and transvaginal ultrasound examinations of the pelvis were performed. Transabdominal technique was performed for global imaging of the pelvis including uterus, ovaries, adnexal  regions, and pelvic cul-de-sac. It was necessary to proceed with endovaginal exam following the transabdominal exam to visualize the uterus and ovaries. COMPARISON:  None FINDINGS: The study was limited by body habitus. Uterus Measurements: 8.4 x 4.2 x 4.7 cm. The uterus is anteverted in appearance. No fibroids or other mass visualized. Endometrium The margins of the endometrial cavity are not well visualized and inhomogeneous. What appears to be the endometrial stripe is 15 mm in thickness. Right ovary No adnexal mass.  The right ovary was not visualized. Left ovary No adnexal mass.  The left ovary was not visualized. Other findings No abnormal free fluid. IMPRESSION: Neither ovary was visualized.  No adnexal mass however is seen. Inhomogeneous appearance of the endometrial stripe. What appears to be the endometrial lining measures 15 mm in thickness which is above normal for age. Differential considerations for this appearance would include neoplasia, hyperplasia or polyp. Electronically Signed   By: Ashley Royalty M.D.   On: 05/26/2016 02:35   US Pelvis Complete  Result Date: 05/26/2016 CLINICAL DATA:  Postmenopausal bleeding.  Patient not on tamoxifen. EXAM: TRANSABDOMINAL AND TRANSVAGINAL ULTRASOUND OF PELVIS TECHNIQUE: Both transabdominal and transvaginal ultrasound examinations  of the pelvis were performed. Transabdominal technique was performed for global imaging of the pelvis including uterus, ovaries, adnexal regions, and pelvic cul-de-sac. It was necessary to proceed with endovaginal exam following the transabdominal exam to visualize the uterus and ovaries. COMPARISON:  None FINDINGS: The study was limited by body habitus. Uterus Measurements: 8.4 x 4.2 x 4.7 cm. The uterus is anteverted in appearance. No fibroids or other mass visualized. Endometrium The margins of the endometrial cavity are not well visualized and inhomogeneous. What appears to be the endometrial stripe is 15 mm in thickness. Right  ovary No adnexal mass.  The right ovary was not visualized. Left ovary No adnexal mass.  The left ovary was not visualized. Other findings No abnormal free fluid. IMPRESSION: Neither ovary was visualized.  No adnexal mass however is seen. Inhomogeneous appearance of the endometrial stripe. What appears to be the endometrial lining measures 15 mm in thickness which is above normal for age. Differential considerations for this appearance would include neoplasia, hyperplasia or polyp. Electronically Signed   By: Ashley Royalty M.D.   On: 05/26/2016 02:35   US Renal  Result Date: 05/30/2016 CLINICAL DATA:  Acute renal insufficiency. EXAM: RENAL / URINARY TRACT ULTRASOUND COMPLETE COMPARISON:  CT scan 05/24/2016 FINDINGS: Right Kidney: Length: 10.8 cm. Mild renal cortical thinning but normal echogenicity. No focal lesions, renal calculi or hydronephrosis. Left Kidney: Length: 13.3 cm. Normal renal cortical thickness and echogenicity. No focal lesions, renal calculi or hydronephrosis. Bladder: Decompressed by a Foley catheter. Other: Cholelithiasis. IMPRESSION: Mild renal cortical thinning of the right kidney but normal echogenicity. No hydronephrosis. Electronically Signed   By: Marijo Sanes M.D.   On: 05/30/2016 10:40   Mr Foot Left W Wo Contrast  Result Date: 05/13/2016 CLINICAL DATA:  Increasing left foot pain and erythema over the last week. Cellulitis with foreign body and suspected sepsis. EXAM: MRI OF THE LEFT FOREFOOT WITHOUT AND WITH CONTRAST TECHNIQUE: Multiplanar, multisequence MR imaging of the left forefoot was performed both before and after administration of intravenous contrast. CONTRAST:  60m MULTIHANCE GADOBENATE DIMEGLUMINE 529 MG/ML IV SOLN COMPARISON:  Radiographs 05/12/2016. FINDINGS: Bones/Joint/Cartilage The metallic foreign body within the first web states creates significant magnetic susceptibility artifact, limiting evaluation of the first and second digits. Allowing for this artifact,  there is no abnormal marrow signal or enhancement. There is no evidence of cortical destruction, acute fracture or dislocation. No significant joint effusions or abnormal synovial enhancement demonstrated. Ligaments The Lisfranc ligament is intact. 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 There is mild T2 hyperintensity along the flexor tendons. No focal intramuscular fluid collections or suspicious enhancement seen. Soft tissues There is dorsal subcutaneous edema. There is an ill-defined fluid collection dorsal to the second and third metatarsal heads, measuring up to 2.3 cm in greatest dimension. This demonstrates no peripheral enhancement or overlying skin defect. No other focal fluid collections are seen. IMPRESSION: 1. Findings are consistent with dorsal forefoot cellulitis. There is a nonspecific small fluid collection within the dorsal subcutaneous fat without apparent overlying skin defect. 2. No evidence of deep abscess, osteomyelitis or septic joint. 3. Metallic foreign body within the first web space, creating moderate susceptibility artifact. Electronically Signed   By: WRichardean SaleM.D.   On: 05/13/2016 09:14   Dg Chest Port 1 View  Result Date: 05/15/2016 CLINICAL DATA:  ETT placement EXAM: PORTABLE CHEST 1 VIEW COMPARISON:  05/15/2016 FINDINGS: Endotracheal tube tip encroaches right  mainstem bronchus orifice. Esophageal tube tip is below the diaphragm but not well visualized. Low lung volumes with subsegmental atelectasis at both bases. Borderline to mild cardiomegaly. No edema or infiltrate. IMPRESSION: Endotracheal tube tip encroaches the right mainstem bronchus orifice. Low lung volumes with subsegmental atelectasis at the bases. Electronically Signed   By: Donavan Foil M.D.   On: 05/24/2016 15:59   Dg Chest Port 1 View  Result Date: 05/15/2016 CLINICAL DATA:  Status post PICC line placement on the right EXAM:  PORTABLE CHEST 1 VIEW COMPARISON:  07/26/2014 FINDINGS: Cardiac shadow is stable. Lungs are hypoinflated but clear. A new right-sided PICC line is noted with catheter tip at the cavoatrial junction in satisfactory position. No bony abnormality is noted. IMPRESSION: Status post PICC line placement in satisfactory position. Electronically Signed   By: Inez Catalina M.D.   On: 05/15/2016 12:40   Dg Abd Portable 1 View  Result Date: 05/08/2016 CLINICAL DATA:  OG tube placement EXAM: PORTABLE ABDOMEN - 1 VIEW COMPARISON:  None. FINDINGS: Orogastric tube with the tip projecting over the antrum of the stomach. There is no bowel dilatation to suggest obstruction. There is no evidence of pneumoperitoneum, portal venous gas or pneumatosis. There are no pathologic calcifications along the expected course of the ureters. The osseous structures are unremarkable. IMPRESSION: Orogastric tube with the tip projecting over the antrum of the stomach. Electronically Signed   By: Kathreen Devoid   On: 05/28/2016 15:59   Dg Foot Complete Left  Result Date: 05/12/2016 CLINICAL DATA:  Left foot swelling. EXAM: LEFT FOOT - COMPLETE 3+ VIEW COMPARISON:  None. FINDINGS: There is no evidence of fracture or dislocation. There is no evidence of arthropathy. Spurring of posterior calcaneus is noted. Dorsal soft tissue swelling is noted concerning for cellulitis. Foreign body is seen in plantar soft tissues adjacent to second proximal phalanx. IMPRESSION: Radiopaque foreign body is noted in second toe. Dorsal soft tissue swelling is noted consistent with cellulitis. No fracture or dislocation is noted. Electronically Signed   By: Marijo Conception, M.D.   On: 05/12/2016 12:20   US Abdomen Limited Ruq  Result Date: 05/26/2016 CLINICAL DATA:  Right upper quadrant pain. EXAM: US ABDOMEN LIMITED - RIGHT UPPER QUADRANT COMPARISON:  None. FINDINGS: Gallbladder: Physiologically distended. Multiple gallstones largest measuring 5 mm. No gallbladder  wall thickening, wall thickness of 2.5 mm. No pericholecystic fluid. No sonographic Gage sign noted by sonographer. Common bile duct: Diameter: 3.6 mm, normal. Liver: No focal lesion identified. Mild diffuse increase in parenchymal echogenicity. Normal directional flow in the main portal vein. IMPRESSION: 1. Cholelithiasis without sonographic findings of acute cholecystitis. 2. Mild hepatic steatosis. Electronically Signed   By: Jeb Levering M.D.   On: 05/26/2016 01:40    Assessment: Laura Kline is a 61 y.o. female admitted with unresponsive, increasing SOB and found hypotensive, wbc elevated, no on pressors, intubated. Recently had I and d of R foot abscess with MSSA on cx but the foot appears relatively uninfected at this time. ALso recent vaginal bleeding, enlarged endometrial stripe on USS and elevated AST ALT. Currently ALT AST Nml but T bili elevated. Has thrombocytopenia. CT abd unrevealing. Has ARF.  No clear source of sepsis.  The foot does not appear overwhelmingly infected.  Likley has PNA with aspiration component. Sputum cx pending.  Recommendations Await sputum cx Cont vanco cefepime and add flagyl for aspiration.  Add doxycycline for now as well.  Check acute hep panel Thank you very much for  allowing me to participate in the care of this patient. Please call with questions.   Cheral Marker. Ola Spurr, MD

## 2016-05-30 NOTE — Consult Note (Signed)
Patient Demographics  Laura Kline, is a 61 y.o. female   MRN: [id number]   DOB - [birth year]  Admit Date - 05/27/2016    Outpatient Primary MD for the patient is Patient, No Pcp Per  Consult requested in the Hospital by Henreitta Leber, MD, On 05/30/2016  Reason for consult:Open wound left foot concern for sepsis.  Reason for consult patient had cellulitis abscess to the left foot approximately 2 weeks ago had surgery for an I&D the abscess by me she was readmitted Hospital recently for a vaginal bleed. She has apparently become septic and is in the CCU at this point. Uncertain as to the source of her sepsis. She also has renal failure.   With History of -  Past Medical History:  Diagnosis Date  . Coronary atherosclerosis    LAD calcifications noted on CT scan in 2016 with negative nuclear stress test.  . DKA (diabetic ketoacidoses) ([place])    a. 05/2009  . Essential hypertension   . History of Bell's palsy   . Hyperlipidemia   . Hypertension   . Hypokalemia   . IDDM (insulin dependent diabetes mellitus) ([place])   . Long-term insulin use ([place])   . Morbid obesity ([place])   . Peripheral vascular disease [facility])       Past Surgical History:  Procedure Laterality Date  . CATARACT EXTRACTION W/PHACO Left 02/08/2015   Procedure: CATARACT EXTRACTION PHACO AND INTRAOCULAR LENS PLACEMENT (IOC);  Surgeon: Birder Robson, MD;  Location: [facility] ORS;  Service: Ophthalmology;  Laterality: Left;  [place] 01:33 AP% 15.9 CDE 14.94 flyuid pack lot # [id number]  . CESAREAN SECTION     x2  . EYE SURGERY Right    Cataract Extraction with IOL  . FOREIGN BODY REMOVAL Left 05/13/2016   Procedure: REMOVAL FOREIGN BODY EXTREMITY;  Surgeon: Albertine Patricia, DPM;  Location: [facility] ORS;  Service: Podiatry;  Laterality: Left;  . INCISION AND  DRAINAGE Left 05/13/2016   Procedure: INCISION AND DRAINAGE;  Surgeon: Albertine Patricia, DPM;  Location: [facility] ORS;  Service: Podiatry;  Laterality: Left;    in for   Chief Complaint  Patient presents with  . Respiratory Distress     HPI  Laura Kline  is a 61 y.o. female,  Reason for consult patient had cellulitis abscess to the left foot approximately 2 weeks ago had surgery for an I&D the abscess by me she was readmitted Hospital recently for a vaginal bleed. She has apparently become septic and is in the CCU at this point. Uncertain as to the source of her sepsis. She also has renal failure.     Review of Systems    In addition to the HPI above,  No Fever-chills, No Headache, No changes with Vision or hearing, No problems swallowing food or Liquids, No Chest pain, Cough or Shortness of Breath, No Abdominal pain, No Nausea or Vommitting, Bowel movements are regular, No Blood in stool or Urine, No dysuria, No new skin rashes or bruises, No new joints pains-aches,  No new weakness, tingling, numbness in any extremity, No recent weight gain or loss, No polyuria, polydypsia or polyphagia, No significant Mental Stressors.  A full 10 point Review of  Systems was done, except as stated above, all other Review of Systems were negative.   Social History Social History  Substance Use Topics  . Smoking status: Never Smoker  . Smokeless tobacco: Never Used  . Alcohol use No     Family History Family History  Problem Relation Age of Onset  . Hypertension Mother   . Hypertension Father   . CAD Father        s/p cabg  . Heart attack Father   . Heart failure Father      Prior to Admission medications   Medication Sig Start Date End Date Taking? Authorizing Provider  apixaban (ELIQUIS) 5 MG TABS tablet Take 1 tablet (5 mg total) by mouth 2 (two) times daily. 05/18/16  Yes Gouru, Illene Silver, MD  clindamycin (CLEOCIN) 300 MG capsule Take 1 capsule (300 mg total) by mouth 4  (four) times daily. 05/28/16 06/03/16 Yes Fritzi Mandes, MD  digoxin (LANOXIN) 0.25 MG tablet Take 1 tablet (0.25 mg total) by mouth daily. 05/19/16  Yes Gouru, Illene Silver, MD  diltiazem (CARDIZEM CD) 180 MG 24 hr capsule Take 1 capsule (180 mg total) by mouth daily. 05/19/16  Yes Gouru, Illene Silver, MD  furosemide (LASIX) 40 MG tablet Take 1 tablet (40 mg total) by mouth daily. 05/19/16  Yes Gouru, Illene Silver, MD  HYDROcodone-acetaminophen (NORCO) 7.5-325 MG tablet Take 1 tablet by mouth every 6 (six) hours as needed for moderate pain. 05/18/16  Yes Gouru, Illene Silver, MD  Insulin Glargine (LANTUS SOLOSTAR) 100 UNIT/ML Solostar Pen Inject 50 Units into the skin at bedtime. 05/28/16  Yes Fritzi Mandes, MD  levofloxacin (LEVAQUIN) 500 MG tablet Take 1 tablet (500 mg total) by mouth daily. 05/28/16 06/03/16 Yes Fritzi Mandes, MD  loperamide (IMODIUM A-D) 2 MG tablet Take 2 mg by mouth every 8 (eight) hours as needed for diarrhea or loose stools.   Yes [provider]  metoprolol (LOPRESSOR) 50 MG tablet Take 1 tablet (50 mg total) by mouth 2 (two) times daily. 10/07/14  Yes Wellington Hampshire, MD  norethindrone (AYGESTIN) 5 MG tablet Take 1 tablet (5 mg total) by mouth 3 (three) times daily. 05/28/16  Yes Fritzi Mandes, MD  nystatin (MYCOSTATIN/NYSTOP) powder Apply 1 g topically as needed (for rash and redness).   Yes [provider]  ondansetron (ZOFRAN) 4 MG tablet Take 1 tablet (4 mg total) by mouth every 6 (six) hours as needed for nausea. 05/18/16  Yes Gouru, Illene Silver, MD  rosuvastatin (CRESTOR) 10 MG tablet Take 1 tablet (10 mg total) by mouth daily after supper. 10/07/14  Yes Wellington Hampshire, MD  senna-docusate (SENOKOT-S) 8.6-50 MG tablet Take 1 tablet by mouth 2 (two) times daily. 05/18/16  Yes GouruIllene Silver, MD    Anti-infectives    Start     Dose/Rate Route Frequency Ordered Stop   05/30/16 1800  ceFEPIme (MAXIPIME) 2 g in dextrose 5 % 50 mL IVPB     2 g 100 mL/hr over 30 Minutes Intravenous Every 24 hours 05/19/2016  1822     05/25/2016 2030  vancomycin (VANCOCIN) IVPB 1000 mg/200 mL premix     1,000 mg 200 mL/hr over 60 Minutes Intravenous  Once 05/11/2016 2025 05/08/2016 2221   06/01/2016 1900  levofloxacin (LEVAQUIN) IVPB 750 mg  Status:  Discontinued     750 mg 100 mL/hr over 90 Minutes Intravenous Every 48 hours 06/02/2016 1820 05/30/16 0843   05/08/2016 1900  vancomycin (VANCOCIN) IVPB 1000 mg/200 mL premix  Status:  Discontinued  1,000 mg 200 mL/hr over 60 Minutes Intravenous  Once 05/24/2016 1847 05/24/2016 2151   05/16/2016 1545  levofloxacin (LEVAQUIN) IVPB 750 mg  Status:  Discontinued     750 mg 100 mL/hr over 90 Minutes Intravenous  Once 05/15/2016 1530 06/03/2016 1538   06/04/2016 1545  aztreonam (AZACTAM) 2 g in dextrose 5 % 50 mL IVPB  Status:  Discontinued     2 g 100 mL/hr over 30 Minutes Intravenous  Once 05/11/2016 1530 06/01/2016 1537   05/15/2016 1545  vancomycin (VANCOCIN) IVPB 1000 mg/200 mL premix     1,000 mg 200 mL/hr over 60 Minutes Intravenous  Once 06/05/2016 1530 05/30/16 0725   05/28/2016 1545  ceFEPIme (MAXIPIME) 2 g in dextrose 5 % 50 mL IVPB     2 g 100 mL/hr over 30 Minutes Intravenous  Once 05/22/2016 1538 05/30/16 0725      Scheduled Meds: . budesonide (PULMICORT) nebulizer solution  0.25 mg Nebulization Q6H  . chlorhexidine gluconate (MEDLINE KIT)  15 mL Mouth Rinse BID  . feeding supplement (PRO-STAT SUGAR FREE 64)  30 mL Per Tube BID  . feeding supplement (VITAL HIGH PROTEIN)  1,000 mL Per Tube Q24H  . heparin subcutaneous  5,000 Units Subcutaneous Q8H  . ipratropium-albuterol  3 mL Nebulization Q6H  . mouth rinse  15 mL Mouth Rinse 10 times per day  . pantoprazole (PROTONIX) IV  40 mg Intravenous Q24H  . sodium chloride flush  10-40 mL Intracatheter Q12H   Continuous Infusions: . albumin human    . ceFEPime (MAXIPIME) IV    . fentaNYL 200 mcg/hr (05/30/16 0843)  . insulin (NOVOLIN-R) infusion 5.9 Units/hr (05/30/16 1131)  . norepinephrine (LEVOPHED) Adult infusion 58 mcg/min  (05/30/16 0900)  .  sodium bicarbonate  infusion 1000 mL 50 mL/hr at 05/30/16 1131  . sodium chloride Stopped (05/30/16 1212)  . vasopressin (PITRESSIN) infusion - *FOR SHOCK* 0.03 Units/min (05/30/16 0600)   PRN Meds:.bisacodyl, fentaNYL, midazolam, [DISCONTINUED] ondansetron **OR** ondansetron (ZOFRAN) IV, sennosides, sodium chloride flush  Allergies  Allergen Reactions  . Rofecoxib Other (See Comments)    Reaction: unknown  . Penicillins Rash and Other (See Comments)    Has patient had a PCN reaction causing immediate rash, facial/tongue/throat swelling, SOB or lightheadedness with hypotension: Unknown Has patient had a PCN reaction causing severe rash involving mucus membranes or skin necrosis: Unknown Has patient had a PCN reaction that required hospitalization: Unknown Has patient had a PCN reaction occurring within the last 10 years: Unknown If all of the above answers are "NO", then may proceed with Cephalosporin use.     Physical Exam  Vitals  Blood pressure 95/69, pulse 93, temperature 97.6 F (36.4 C), temperature source Oral, resp. rate 15, height 5' (1.524 m), weight 135.5 kg (298 lb 11.6 oz), SpO2 100 %.  Lower Extremity exam:lower extremity exam on the left foot shows the wound is stable at this point. Noted improvement from 2 weeks ago. The plantar wound is mostly healed the dorsal wound has a small g that is very superficial approximately0.8 x 0.6 cm. Only a couple millimeters of depth. Sutures removed today there is still little bit of dehiscence to the plantar wound but since she's in CCU for a while she will not be doing much walking on it. There is no cellulitis around the area and no purulent drainage. It looks like it's progressing and healing well. This is unlikely the source of her sepsis.  Data Review  CBC  Recent Labs Lab  05/25/16 2252 05/27/16 0643 05/30/2016 1503 05/30/16 0529  WBC 3.6 2.9* 19.8* 10.3  HGB 12.0 11.7* 14.4 13.4  HCT 35.1 34.9* 44.6  40.2  PLT 101* 109* 438 455*  MCV 92.8 92.9 95.5 92.9  MCH 31.7 31.2 30.8 31.0  MCHC 34.2 33.6 32.2 33.4  RDW 13.1 13.4 14.3 14.1  LYMPHSABS 0.9*  --  2.2  --   MONOABS 0.4  --  3.6*  --   EOSABS 0.2  --  0.0  --   BASOSABS 0.0  --  0.0  --    ------------------------------------------------------------------------------------------------------------------  Chemistries   Recent Labs Lab 05/25/16 2252 05/27/16 0643 05/28/16 0403 06/02/2016 1503 05/28/2016 2050 05/30/16 0529  NA 133* 139 135 126* 127* 133*  K 3.5 3.2* 3.4* 4.7 4.8 3.0*  CL 97* 112* 109 96* 100* 99*  CO2 22 19* 18* 11* 12* 20*  GLUCOSE 305* 240* 200* 391* 482* 379*  BUN 114* 77* 67* 86* 85* 80*  CREATININE 2.67* 1.55* 1.41* 3.42* 3.39* 3.07*  CALCIUM 7.4* 7.0* 7.2* 7.8* 7.0* 6.5*  MG  --   --   --   --  2.0 1.6*  AST 140* 29  --  34  --  44*  ALT 208* 89*  --  30  --  29  ALKPHOS 139* 121  --  89  --  81  BILITOT 1.5* 1.4*  --  3.0*  --  4.0*   ------------------------------------------------------------------------------------------------------------------ estimated creatinine clearance is 24.4 mL/min (A) (by C-G formula based on SCr of 3.07 mg/dL (H)). ------------------------------------------------------------------------------------------------------------------  Urinalysis    Component Value Date/Time   COLORURINE AMBER (A) 06/01/2016 1700   APPEARANCEUR CLOUDY (A) 05/12/2016 1700   LABSPEC 1.017 05/19/2016 1700   PHURINE 5.0 06/02/2016 1700   GLUCOSEU 50 (A) 05/30/2016 1700   HGBUR MODERATE (A) 05/13/2016 1700   BILIRUBINUR NEGATIVE 05/25/2016 1700   KETONESUR NEGATIVE 05/14/2016 1700   PROTEINUR 30 (A) 05/28/2016 1700   NITRITE NEGATIVE 05/28/2016 1700   LEUKOCYTESUR NEGATIVE 05/31/2016 1700     Assessment & Plan: Overall left foot doing extremely well. Vastly improved over  2 weeks ago. Her wounds are healing with no redness  swelling no evidence of any infection present this timeframe.  Plan: Recommended dressing to the area daily and when she is able start ambulate use the OrthoWedge shoe to keep pressure off of the forefoot. Follow-up my officewhen she is released.  Active Problems:   Septic shock [facility])     Family Communication: Plan discussed with patient and **   Thank you for the consult, we will follow the patient with you in the Hospital.   Perry Mount M.D on 05/30/2016 at 12:13 PM  Thank you for the consult, we will follow the patient with you in the Hospital.

## 2016-05-30 NOTE — Progress Notes (Signed)
[employer] [facility] [place] [facility]   PATIENT NAME: Laura Kline    MR#:  [id number]  DATE OF BIRTH:  1956-01-08  SUBJECTIVE:   Patient here due to septic shock with multiorgan failure. Remains critically ill and intubated on multiple vasopressors and remains oliguric and to be started on CRRT today.  REVIEW OF SYSTEMS:    Review of Systems  Unable to perform ROS: Intubated    Nutrition: NPO Tolerating Diet: No   DRUG ALLERGIES:   Allergies  Allergen Reactions  . Rofecoxib Other (See Comments)    Reaction: unknown  . Penicillins Rash and Other (See Comments)    Has patient had a PCN reaction causing immediate rash, facial/tongue/throat swelling, SOB or lightheadedness with hypotension: Unknown Has patient had a PCN reaction causing severe rash involving mucus membranes or skin necrosis: Unknown Has patient had a PCN reaction that required hospitalization: Unknown Has patient had a PCN reaction occurring within the last 10 years: Unknown If all of the above answers are "NO", then may proceed with Cephalosporin use.     VITALS:  Blood pressure 93/67, pulse 99, temperature 97.9 F (36.6 C), resp. rate 16, height 5' (1.524 m), weight 135.5 kg (298 lb 11.6 oz), SpO2 100 %.  PHYSICAL EXAMINATION:   Physical Exam  GENERAL:  61 y.o.-year-old obese patient lying in bed critically ill appearing.  EYES: Pupils equal, round, reactive to light. No scleral icterus.  HEENT: Head atraumatic, normocephalic. Oropharynx and nasopharynx clear. ET and OG tube in place.  NECK:  Supple, no jugular venous distention. No thyroid enlargement, no tenderness.  LUNGS: Normal breath sounds bilaterally, no wheezing, rales, rhonchi. No use of accessory muscles of respiration.  CARDIOVASCULAR: S1, S2 normal. No murmurs, rubs, or gallops.  ABDOMEN: Soft, nontender, nondistended. Bowel sounds present. No organomegaly or mass.  EXTREMITIES: No cyanosis, clubbing, +1-2 edema b/l.     NEUROLOGIC: sedated & Intubated PSYCHIATRIC: Sedated and intubated.  SKIN: No obvious rash, lesion, LLE ulcer with no acute drainage.  Ulcer located in between the first & 2nd Toes.     LABORATORY PANEL:   CBC  Recent Labs Lab 05/30/16 0529  WBC 10.3  HGB 13.4  HCT 40.2  PLT 455*   ------------------------------------------------------------------------------------------------------------------  Chemistries   Recent Labs Lab 05/30/16 0529  NA 133*  K 3.0*  CL 99*  CO2 20*  GLUCOSE 379*  BUN 80*  CREATININE 3.07*  CALCIUM 6.5*  MG 1.6*  AST 44*  ALT 29  ALKPHOS 81  BILITOT 4.0*   ------------------------------------------------------------------------------------------------------------------  Cardiac Enzymes  Recent Labs Lab 06/03/2016 1503  TROPONINI 0.04*   ------------------------------------------------------------------------------------------------------------------  RADIOLOGY:  Ct Abdomen Pelvis Wo Contrast  Result Date: 05/25/2016 CLINICAL DATA:  Unresponsive.  Septic shock. EXAM: CT ABDOMEN AND PELVIS WITHOUT CONTRAST TECHNIQUE: Multidetector CT imaging of the abdomen and pelvis was performed following the standard protocol without IV contrast. COMPARISON:  Right upper quadrant and pelvic ultrasounds 05/26/2016 FINDINGS: Lower chest: Partially visualized right lower lobe consolidation with more patchy opacities elsewhere in both lower lobes more inferiorly. Likely trace bilateral pleural effusions, with evaluation limited by streak artifact. Partially visualize coronary artery atherosclerosis. Hepatobiliary: No focal liver abnormality is seen. Tiny stones layer in the gallbladder without evidence of wall thickening or pericholecystic inflammatory change. No biliary dilatation. Pancreas: Mild diffuse atrophy. Punctate calcifications at the level of the inferior pancreatic head which may be parenchymal (such as from past pancreatitis) or vascular. No  pancreatic or biliary ductal dilatation to strongly  suggest that these are ductal calculi. No peripancreatic inflammation. Spleen: Unremarkable. Adrenals/Urinary Tract: Unremarkable adrenal glands. No evidence of renal mass, calculi, or hydronephrosis. Bladder decompressed by a Foley catheter. Stomach/Bowel: Enteric tube terminates in the distal stomach. A moderate amount of fluid is present in the stomach. Fluid is present in scattered loops of nondilated small and large bowel including in the rectum. There is no evidence of bowel obstruction, and no gross bowel wall thickening is identified. The appendix is unremarkable. Vascular/Lymphatic: Abdominal aortic atherosclerosis without aneurysm. No enlarged lymph nodes. Reproductive: Small calcification in the uterine fundus, otherwise unremarkable appearance of the uterus and ovaries. Other: No intraperitoneal free fluid. Small fat containing umbilical hernia. Abdominal wall scarring in the midline inferior to the umbilicus. Musculoskeletal: Advanced thoracic and lumbar spine disc degeneration and advanced lumbar facet arthropathy. IMPRESSION: 1. Bibasilar pulmonary airspace opacities including partially visualized right lower lobe consolidation which may reflect pneumonia. 2. No definite acute abnormality identified in the abdomen or pelvis. 3. Cholelithiasis. 4. Other incidental findings as above. 5.  Aortic Atherosclerosis (ICD10-I70.0). Electronically Signed   By: Sebastian AcheAllen  Grady M.D.   On: 05-Apr-2016 18:03   Dg Chest 1 View  Result Date: 08-01-16 CLINICAL DATA:  Central line EXAM: CHEST 1 VIEW COMPARISON:  05-Apr-2016 FINDINGS: Repositioned endotracheal tube, the tip is approximately 4.2 cm superior to the carina. Esophageal tube tip is below the diaphragm. Right IJ central venous catheter tip faintly visualized over the proximal right atrium. No pneumothorax. Cardiomegaly with mild central congestion. Patchy perihilar and basilar opacity. IMPRESSION: 1.  Repositioning of endotracheal tube 2. Right IJ central venous catheter tip overlies proximal right atrium, no pneumothorax 3. Low lung volumes. Slight increased patchy perihilar opacity compared to prior which may reflect increasing atelectasis or vascular congestion. Electronically Signed   By: Jasmine PangKim  Fujinaga M.D.   On: 05-Apr-2016 17:09   Ct Head Wo Contrast  Result Date: 08-01-16 CLINICAL DATA:  Unresponsive EXAM: CT HEAD WITHOUT CONTRAST TECHNIQUE: Contiguous axial images were obtained from the base of the skull through the vertex without intravenous contrast. COMPARISON:  None. FINDINGS: Brain: No acute territorial infarction, hemorrhage or intracranial mass is seen. Prominent extra-axial CSF spaces anteriorly likely related to atrophy. Ventricles are nonenlarged. Vascular: No hyperdense vessels. Carotid artery calcification and vertebral artery calcification. Skull: No fracture or suspicious bone lesion Sinuses/Orbits: Mucosal thickening in the maxillary and ethmoid sinuses. No acute orbital abnormality. Bilateral lens extraction. Other: None IMPRESSION: No CT evidence for acute intracranial abnormality. Electronically Signed   By: Jasmine PangKim  Fujinaga M.D.   On: 05-Apr-2016 17:52   [place] Renal  Result Date: 05/30/2016 CLINICAL DATA:  Acute renal insufficiency. EXAM: RENAL / URINARY TRACT ULTRASOUND COMPLETE COMPARISON:  CT scan 05-Apr-2016 FINDINGS: Right Kidney: Length: 10.8 cm. Mild renal cortical thinning but normal echogenicity. No focal lesions, renal calculi or hydronephrosis. Left Kidney: Length: 13.3 cm. Normal renal cortical thickness and echogenicity. No focal lesions, renal calculi or hydronephrosis. Bladder: Decompressed by a Foley catheter. Other: Cholelithiasis. IMPRESSION: Mild renal cortical thinning of the right kidney but normal echogenicity. No hydronephrosis. Electronically Signed   By: Rudie MeyerP.  Gallerani M.D.   On: 05/30/2016 10:40   Dg Chest Port 1 View  Result Date: 08-01-16 CLINICAL DATA:   ETT placement EXAM: PORTABLE CHEST 1 VIEW COMPARISON:  05/15/2016 FINDINGS: Endotracheal tube tip encroaches right mainstem bronchus orifice. Esophageal tube tip is below the diaphragm but not well visualized. Low lung volumes with subsegmental atelectasis at both bases. Borderline to mild cardiomegaly. No edema  or infiltrate. IMPRESSION: Endotracheal tube tip encroaches the right mainstem bronchus orifice. Low lung volumes with subsegmental atelectasis at the bases. Electronically Signed   By: Jasmine Pang M.D.   On: 18-Jun-2016 15:59   Dg Abd Portable 1 View  Result Date: Jun 18, 2016 CLINICAL DATA:  OG tube placement EXAM: PORTABLE ABDOMEN - 1 VIEW COMPARISON:  None. FINDINGS: Orogastric tube with the tip projecting over the antrum of the stomach. There is no bowel dilatation to suggest obstruction. There is no evidence of pneumoperitoneum, portal venous gas or pneumatosis. There are no pathologic calcifications along the expected course of the ureters. The osseous structures are unremarkable. IMPRESSION: Orogastric tube with the tip projecting over the antrum of the stomach. Electronically Signed   By: Elige Ko   On: 18-Jun-2016 15:59     ASSESSMENT AND PLAN:   61 year old female with past medical history of peripheral vascular disease, morbid obesity, insulin-dependent diabetes, hypertension, hyperlipidemia, essential hypertension, recent admission for left lower extremity cellulitis and acute kidney injury of presents to the hospital due to altered mental status and noted to be in septic shock.  1. Sepsis with septic shock-source unclear but suspected to be some due to left lower extremity ulcer/cellulitis. -Continue broad-spectrum antibiotics with vancomycin, cefepime. Follow cultures.  2. Septic shock-patient is severely hypotensive and remains on 2 vasopressors. -continue aggressive IV fluid hydration, vasopressors. Continue empiric antibiotics as mentioned above. Follow  hemodynamics.  3. Acute respiratory failure-patient intubated due to obtundation and altered mental status and unable to maintain her airway. -Continue vent support, patient remains in a 50% FiO2 and wean as per pulmonary/intensivist. Follow serial ABGs.  4. Oliguric renal failure-secondary to sepsis and septic shock. - seen by nephrology, plan for starting CRRT today. -Continue bicarbonate drip.  5. Metabolic acidosis-secondary to sepsis and also renal failure. Patient to be started on CRRT, continue bicarbonate infusion for now. Further care as per nephrology.  6. Diabetes type 2 without complication-continue sliding scale insulin.  7. Altered mental status-metabolic encephalopathy secondary to sepsis, septic shock and multiorgan failure. Follow mental status once patient is extubated and her sepsis has improved.   Patient is critically ill with multiorgan failure. Prognosis is poor.  All the records are reviewed and case discussed with Care Management/Social Worker. Management plans discussed with the patient, family and they are in agreement.  CODE STATUS: Full  DVT Prophylaxis: Hep. SQ  TOTAL TIME TAKING CARE OF THIS PATIENT: 30 minutes.   POSSIBLE D/C unclear, DEPENDING ON CLINICAL CONDITION and progress.   Houston Siren M.D on 05/30/2016 at 3:08 PM  Between 7am to 6pm - Pager - [phone]  After 6pm go to [email or web address] - Social research officer, government  [facility]  Office  [phone]  CC: Primary care physician; Patient, No Pcp Per

## 2016-05-30 NOTE — Procedures (Addendum)
Hemodialysis Catheter Insertion Procedure Note Laura RootsSabrina [name] Kline [id number] 08/22/1955  Procedure: Insertion of Hemodialysis Catheter Indications: Dialysis Access   Procedure Details Consent: Risks of procedure as well as the alternatives and risks of each were explained to the (patient/caregiver).  Consent for procedure obtained. Time Out: Verified patient identification, verified procedure, site/side was marked, verified correct patient position, special equipment/implants available, medications/allergies/relevent history reviewed, required imaging and test results available.  Performed  Maximum sterile technique was used including antiseptics, cap, gloves, gown, hand hygiene, mask and sheet. Skin prep: Chlorhexidine; local anesthetic administered Double lumen hemodialysis catheter was inserted into right vein using wire exchange technique.  Evaluation Blood flow good Complications: No apparent complications Patient did tolerate procedure well. Chest X-ray ordered to verify placement.  Sonda Rumbleana Blakeney, AGNP  Pulmonary/Critical Care Pager [phone] (please enter 7 digits) PCCM Consult Pager [phone] (please enter [phone])   Billy Fischeravid Khloee Garza, MD PCCM service Mobile [phone] Pager [phone] 05/30/2016 5:24 PM

## 2016-05-30 NOTE — Progress Notes (Signed)
CRRT started but access pressures high and unable to pull blood from dialysis catheter to right groin.  RN made Annabelle Harmanana, NP aware.  Will assess for other dialysis access.

## 2016-05-30 NOTE — Progress Notes (Signed)
CRRT started at 1810 and running well at this time.  Afib per cardiac monitor with runs of vtach during catheter exchange to right IJ.  Annabelle Harmanana, NP aware and at bedside during vtach runs.  Vasopressin and levophed drips infusing to maintain MAP of 65.  35 cc total urine output this shift.  Patient is purposeful but does not follow commands.  Fentanyl drip for pain and agitation.  Husband visited this evening and was updated on plan of care.  Report given to Tess, RN who is taking over patient's care this evening.

## 2016-05-30 NOTE — Progress Notes (Addendum)
Pharmacy Antibiotic Note/CRRT Medication Adjusment  Laura Kline is a 61 y.o. female with a recent I&D of left foot abscess admitted on 01-Oct-2016 with sepsis.  Pharmacy has been consulted for vancomycin and cefepime dosing. Patient is beginning CRRT for acute renal failure.   Plan: 1. Will begin vancomycin 1000 mg iv q 24 hours and plan on checking a level 5/25 at 2030. Goal vancomycin level= 15-25 mcg/ml.   Will change cefepime dosing to 2 g iv q 12 hours.   2. Aside from antibiotics, no medications require adjustment for CRRT at present.   Height: 5' (152.4 cm) Weight: 298 lb 11.6 oz (135.5 kg) IBW/kg (Calculated) : 45.5  Temp (24hrs), Avg:97.7 F (36.5 C), Min:96.7 F (35.9 C), Max:98.2 F (36.8 C)   Recent Labs Lab 05/25/16 2252 05/27/16 0643 05/28/16 0403 2016/02/08 1503 2016/02/08 1900 2016/02/08 2050 05/30/16 0529  WBC 3.6 2.9*  --  19.8*  --   --  10.3  CREATININE 2.67* 1.55* 1.41* 3.42*  --  3.39* 3.07*  LATICACIDVEN 1.6  --   --  4.8* 2.1*  --   --     Estimated Creatinine Clearance: 24.4 mL/min (A) (by C-G formula based on SCr of 3.07 mg/dL (H)).    Allergies  Allergen Reactions  . Rofecoxib Other (See Comments)    Reaction: unknown  . Penicillins Rash and Other (See Comments)    Has patient had a PCN reaction causing immediate rash, facial/tongue/throat swelling, SOB or lightheadedness with hypotension: Unknown Has patient had a PCN reaction causing severe rash involving mucus membranes or skin necrosis: Unknown Has patient had a PCN reaction that required hospitalization: Unknown Has patient had a PCN reaction occurring within the last 10 years: Unknown If all of the above answers are "NO", then may proceed with Cephalosporin use.     Antimicrobials this admission: vanc 5/22 >>  levofloxacin 5/22 >> 5/23 Cefepime 5/22 >>  Dose adjustments this admission:  Microbiology results: BCx: NGTD UCx:   TA: GPC, GVR MRSA PCR: negative  Thank you for  allowing pharmacy to be a part of this patient's care.  Luisa HartScott Mickey Esguerra, PharmD Clinical Pharmacist  05/30/2016 1:55 PM

## 2016-05-30 NOTE — Procedures (Signed)
PROCEDURE NOTE: CVL PLACEMENT  INDICATION:    Monitoring of central venous pressures and/or administration of medications optimally administered in central vein  CONSENT:   Risks of procedure as well as the alternatives were explained to the patient or surrogate. Consent for procedure obtained. A time out was performed.   PROCEDURE  Sterile technique was used including antiseptics, cap, gloves, gown, hand hygiene, mask and full body sheet.  Skin prep: Chlorhexidine; local anesthetic administered  A triple lumen catheter was placed in the L [place] vein using the Seldinger technique.   EVALUATION:  Blood flow good  Complications: No apparent complications  Patient tolerated the procedure well.  Chest X-ray ordered to verify placement and is pending   Billy Fischeravid Charlen Bakula, MD PCCM service Mobile [phone] 05/30/2016 3:44 PM

## 2016-05-30 NOTE — Progress Notes (Signed)
Dialysis catheter placed by Annabelle Harmanana, NP.  RN made Annabelle Harmanana, NP aware that patient having elevated heart rate taching up to 164 and 150's non-sustained.  Afib.  NP acknowledged and looking at labs.

## 2016-05-30 NOTE — Progress Notes (Signed)
Initial Nutrition Assessment  DOCUMENTATION CODES:   Morbid obesity  INTERVENTION:  1. When medically appropriate, begin permitted hypocaloric feedings with Vital High Protein @ 5455mL/hr Provides 1320 calories, 116gm proten, and 1104cc free water  NUTRITION DIAGNOSIS:   Inadequate oral intake related to inability to eat as evidenced by NPO status.  GOAL:   Provide needs based on ASPEN/SCCM guidelines  MONITOR:   I & O's, Labs, Vent status, TF tolerance, Weight trends  REASON FOR ASSESSMENT:   Ventilator    ASSESSMENT:   Today patient was found to be short of breath and  unresponsive at the rehabilitation center. Patient was sent over to the emergency department. she was found to be bradycardiac, Atropine was given and was intubated in the ED  Patient is currently intubated on ventilator support MV: 9.1 L/min Temp (24hrs), Avg:97.6 F (36.4 C), Min:96.7 F (35.9 C), Max:98.2 F (36.8 C) Propofol: None Discussed in Rounds Labs and medications reviewed: CBGs 177-228, Na 133, K 3.0, Mg 1.6, TBili 4.0 AST 44 Fentanyl gtt, Insulin gtt, Levo gtt, Vaso gtt NaHCO3 w/ D5 @ 6050mL/hr --> 204 calories  Diet Order:     Skin:  Wound (see comment) (Pressure injury to sacrum)  Last BM:  05/28/2016  Height:   Ht Readings from Last 1 Encounters:  11/19/2016 5' (1.524 m)    Weight:   Wt Readings from Last 1 Encounters:  05/30/16 298 lb 11.6 oz (135.5 kg)    Ideal Body Weight:  45.45 kg  BMI:  Body mass index is 58.34 kg/m.  Estimated Nutritional Needs:   Kcal:  1200-1400 calories (26-30 cal/kg IBW)  Protein:  >/= 113 gm (2.5 gm/kg IBW)  Fluid:  Per MD/NP/PA  EDUCATION NEEDS:   Education needs no appropriate at this time  Dionne AnoWilliam M. Aryaan Persichetti, MS, RD LDN Inpatient Clinical Dietitian Pager [phone]

## 2016-05-30 NOTE — Consult Note (Signed)
[facility]  CONSULT NOTE    Date: 05/30/2016                  Patient Name:  Keyarra Rendall  MRN: [id number]  DOB: 1955-02-19  Age / Sex: 61 y.o., female         PCP: Patient, No Pcp Per                 Service Requesting Consult: Dr. Alva Garnet                 Reason for Consult: Acute renal failure            History of Present Illness: Ms. Dhara Schepp is a 61 y.o. white female with diabetes mellitus type II insulin dependent, hypertension, coronary artery disease, hyperlipidemia, peripheral vascular disease, atrial fibrillationwho was admitted to [facility] on 05/[facility]/2016 for Hyponatremia [E87.1] Hyperglycemia [R73.9] AKI (acute kidney injury) ([place]) [N17.9] Septic shock ([place]) [A41.9, R65.21] Acute renal failure, unspecified acute renal failure type [facility]) [N17.9]   Patient was just discharged from [facility] from 5/18 to 5/21. For acute liver injury and vaginal bleeding. She had a I&D of her left foot abscess on 5/6 by Dr. Elvina Mattes. On Clindamycin and levofloxacin.    Medications: Outpatient medications: Prescriptions Prior to Admission  Medication Sig Dispense Refill Last Dose  . apixaban (ELIQUIS) 5 MG TABS tablet Take 1 tablet (5 mg total) by mouth 2 (two) times daily. 60 tablet 0 05/12/2016 at am  . clindamycin (CLEOCIN) 300 MG capsule Take 1 capsule (300 mg total) by mouth 4 (four) times daily. 24 capsule 0 05/26/2016 at am  . digoxin (LANOXIN) 0.25 MG tablet Take 1 tablet (0.25 mg total) by mouth daily.   05/09/2016 at am  . diltiazem (CARDIZEM CD) 180 MG 24 hr capsule Take 1 capsule (180 mg total) by mouth daily.   05/24/2016 at am  . furosemide (LASIX) 40 MG tablet Take 1 tablet (40 mg total) by mouth daily. 30 tablet  05/13/2016 at am  . HYDROcodone-acetaminophen (NORCO) 7.5-325 MG tablet Take 1 tablet by mouth every 6 (six) hours as needed for moderate pain. 30 tablet 0 prn at prn  . Insulin Glargine (LANTUS SOLOSTAR) 100 UNIT/ML Solostar Pen Inject  50 Units into the skin at bedtime. 15 mL 11 05/28/2016 at qhs  . levofloxacin (LEVAQUIN) 500 MG tablet Take 1 tablet (500 mg total) by mouth daily. 6 tablet 0 05/20/2016 at am  . loperamide (IMODIUM A-D) 2 MG tablet Take 2 mg by mouth every 8 (eight) hours as needed for diarrhea or loose stools.   prn at prn  . metoprolol (LOPRESSOR) 50 MG tablet Take 1 tablet (50 mg total) by mouth 2 (two) times daily. 180 tablet 3 05/16/2016 at am  . norethindrone (AYGESTIN) 5 MG tablet Take 1 tablet (5 mg total) by mouth 3 (three) times daily. 30 tablet 0 06/05/2016 at am  . nystatin (MYCOSTATIN/NYSTOP) powder Apply 1 g topically as needed (for rash and redness).   prn at prn  . ondansetron (ZOFRAN) 4 MG tablet Take 1 tablet (4 mg total) by mouth every 6 (six) hours as needed for nausea. 20 tablet 0 prn at prn  . rosuvastatin (CRESTOR) 10 MG tablet Take 1 tablet (10 mg total) by mouth daily after supper. 90 tablet 3 05/28/2016 at pm  . senna-docusate (SENOKOT-S) 8.6-50 MG tablet Take 1 tablet by mouth 2 (two) times daily.   06/02/2016 at am  Current medications: Current Facility-Administered Medications  Medication Dose Route Frequency Provider Last Rate Last Dose  . bisacodyl (DULCOLAX) suppository 10 mg  10 mg Rectal Daily PRN Dorene Sorrow S, NP      . budesonide (PULMICORT) nebulizer solution 0.25 mg  0.25 mg Nebulization Q6H Wilhelmina Mcardle, MD   0.25 mg at 05/30/16 0846  . ceFEPIme (MAXIPIME) 2 g in dextrose 5 % 50 mL IVPB  2 g Intravenous Q24H Merilyn Baba, [place]      . chlorhexidine gluconate (MEDLINE KIT) (PERIDEX) 0.12 % solution 15 mL  15 mL Mouth Rinse BID Dorene Sorrow S, NP   15 mL at 05/30/16 0826  . feeding supplement (PRO-STAT SUGAR FREE 64) liquid 30 mL  30 mL Per Tube BID Mikael Spray, NP   Stopped at 05/30/16 0000  . feeding supplement (VITAL HIGH PROTEIN) liquid 1,000 mL  1,000 mL Per Tube Q24H Mikael Spray, NP   Stopped at 05/30/16 0000  . fentaNYL (SUBLIMAZE) bolus  via infusion 50-100 mcg  50-100 mcg Intravenous Q1H PRN Tukov, Magadalene S, NP      . fentaNYL 2524mg in NS 2539m(1047mml) infusion-PREMIX  0-300 mcg/hr Intravenous Continuous SimWilhelmina McardleD 20 mL/hr at 05/30/16 0843 200 mcg/hr at 05/30/16 0843  . insulin regular (NOVOLIN R,HUMULIN R) 100 Units in sodium chloride 0.9 % 100 mL (1 Units/mL) infusion   Intravenous Continuous Tukov, Magadalene S, NP 8.4 mL/hr at 05/30/16 0814 8.4 Units/hr at 05/30/16 0814  . ipratropium-albuterol (DUONEB) 0.5-2.5 (3) MG/3ML nebulizer solution 3 mL  3 mL Nebulization Q6H SimWilhelmina McardleD   3 mL at 05/30/16 0846  . MEDLINE mouth rinse  15 mL Mouth Rinse 10 times per day TukMikael SprayP   15 mL at 05/30/16 0600  . midazolam (VERSED) injection 2 mg  2 mg Intravenous Q2H PRN SimWilhelmina McardleD      . norepinephrine (LEVOPHED) 16 mg in dextrose 5 % 250 mL (0.064 mg/mL) infusion  0-65 mcg/min Intravenous Titrated Tukov, Magadalene S, NP 54.4 mL/hr at 05/30/16 0900 58 mcg/min at 05/30/16 0900  . ondansetron (ZOFRAN) injection 4 mg  4 mg Intravenous Q6H PRN Gouru, Aruna, MD      . pantoprazole (PROTONIX) injection 40 mg  40 mg Intravenous Q24H Gouru, Aruna, MD   40 mg at 05/22/2016 1800  . sennosides (SENOKOT) 8.8 MG/5ML syrup 5 mL  5 mL Per Tube BID PRN Tukov, Magadalene S, NP      . sodium chloride 0.9 % bolus 1,000 mL  1,000 mL Intravenous Once TukDorene Sorrow NP 1,000 mL/hr at 05/30/16 0344 1,000 mL at 05/30/16 0344  . vasopressin (PITRESSIN) 40 Units in sodium chloride 0.9 % 250 mL (0.16 Units/mL) infusion  0.03 Units/min Intravenous Continuous Tukov, Magadalene S, NP 11.3 mL/hr at 05/30/16 0600 0.03 Units/min at 05/30/16 0600      Allergies: Allergies  Allergen Reactions  . Rofecoxib Other (See Comments)    Reaction: unknown  . Penicillins Rash and Other (See Comments)    Has patient had a PCN reaction causing immediate rash, facial/tongue/throat swelling, SOB or lightheadedness with  hypotension: Unknown Has patient had a PCN reaction causing severe rash involving mucus membranes or skin necrosis: Unknown Has patient had a PCN reaction that required hospitalization: Unknown Has patient had a PCN reaction occurring within the last 10 years: Unknown If all of the above answers are "NO", then may proceed with Cephalosporin use.  Past Medical History: Past Medical History:  Diagnosis Date  . Coronary atherosclerosis    LAD calcifications noted on CT scan in 2016 with negative nuclear stress test.  . DKA (diabetic ketoacidoses) ([place])    a. 05/2009  . Essential hypertension   . History of Bell's palsy   . Hyperlipidemia   . Hypertension   . Hypokalemia   . IDDM (insulin dependent diabetes mellitus) ([place])   . Long-term insulin use ([place])   . Morbid obesity ([place])   . Peripheral vascular disease [facility])      Past Surgical History: Past Surgical History:  Procedure Laterality Date  . CATARACT EXTRACTION W/PHACO Left 02/08/2015   Procedure: CATARACT EXTRACTION PHACO AND INTRAOCULAR LENS PLACEMENT (IOC);  Surgeon: Birder Robson, MD;  Location: [facility] ORS;  Service: Ophthalmology;  Laterality: Left;  [place] 01:33 AP% 15.9 CDE 14.94 flyuid pack lot # [id number]  . CESAREAN SECTION     x2  . EYE SURGERY Right    Cataract Extraction with IOL  . FOREIGN BODY REMOVAL Left 05/13/2016   Procedure: REMOVAL FOREIGN BODY EXTREMITY;  Surgeon: Albertine Patricia, DPM;  Location: [facility] ORS;  Service: Podiatry;  Laterality: Left;  . INCISION AND DRAINAGE Left 05/13/2016   Procedure: INCISION AND DRAINAGE;  Surgeon: Albertine Patricia, DPM;  Location: [facility] ORS;  Service: Podiatry;  Laterality: Left;     Family History: Family History  Problem Relation Age of Onset  . Hypertension Mother   . Hypertension Father   . CAD Father        s/p cabg  . Heart attack Father   . Heart failure Father      Social History: Social History   Social History  . Marital status: Married     Spouse name: N/A  . Number of children: N/A  . Years of education: N/A   Occupational History  . Not on file.   Social History Main Topics  . Smoking status: Never Smoker  . Smokeless tobacco: Never Used  . Alcohol use No  . Drug use: No  . Sexual activity: Yes   Other Topics Concern  . Not on file   Social History Narrative  . No narrative on file     Review of Systems: Review of Systems  Unable to perform ROS: Critical illness    Vital Signs: Blood pressure (!) 88/64, pulse 89, temperature 97.6 F (36.4 C), temperature source Oral, resp. rate 12, height 5' (1.524 m), weight 135.5 kg (298 lb 11.6 oz), SpO2 100 %.  Weight trends: Filed Weights   05/28/2016 1501 05/15/2016 2050 05/30/16 0500  Weight: 136.1 kg (300 lb) 135.3 kg (298 lb 4.5 oz) 135.5 kg (298 lb 11.6 oz)    Physical Exam: General: Critically ill  Head: ETT   Eyes: Anicteric, PERRL  Neck: RIJ central line  Lungs:  PRVC FiO2 50%  Heart: irregular  Abdomen:  Soft, nontender, obese   Extremities: 1+ peripheral edema.  Neurologic: Intubated, sedated  Skin: Left foot incision - clean, dry intact  Access: none     Lab results: Basic Metabolic Panel:  Recent Labs Lab 05/10/2016 1503 05/12/2016 2050 05/30/16 0529  NA 126* 127* 133*  K 4.7 4.8 3.0*  CL 96* 100* 99*  CO2 11* 12* 20*  GLUCOSE 391* 482* 379*  BUN 86* 85* 80*  CREATININE 3.42* 3.39* 3.07*  CALCIUM 7.8* 7.0* 6.5*  MG  --  2.0 1.6*  PHOS  --  10.6* 7.5*    Liver Function Tests:  Recent Labs Lab 05/27/16 0643 05/28/2016 1503 05/30/16 0529  AST 29 34 44*  ALT 89* 30 29  ALKPHOS 121 89 81  BILITOT 1.4* 3.0* 4.0*  PROT 4.7* 5.3* 4.5*  ALBUMIN 1.9* 2.1* 1.8*    Recent Labs Lab 05/30/2016 1503 05/23/2016 2050  LIPASE 16  --   AMYLASE  --  71   No results for input(s): AMMONIA in the last 168 hours.  CBC:  Recent Labs Lab 05/25/16 2252 05/27/16 0643 05/08/2016 1503 05/30/16 0529  WBC 3.6 2.9* 19.8* 10.3  NEUTROABS 2.1  --   14.0*  --   HGB 12.0 11.7* 14.4 13.4  HCT 35.1 34.9* 44.6 40.2  MCV 92.8 92.9 95.5 92.9  PLT 101* 109* 438 455*    Cardiac Enzymes:  Recent Labs Lab 05/25/2016 1503  TROPONINI 0.04*    BNP: Invalid input(s): POCBNP  CBG:  Recent Labs Lab 05/30/16 0433 05/30/16 0536 05/30/16 0640 05/30/16 0812 05/30/16 0915  GLUCAP 405* 438* 351* 271* 228*    Microbiology: Results for orders placed or performed during the hospital encounter of 05/13/2016  Blood Culture (routine x 2)     Status: None (Preliminary result)   Collection Time: 05/19/2016  3:03 PM  Result Value Ref Range Status   Specimen Description BLOOD L AC  Final   Special Requests BOTTLES DRAWN AEROBIC AND ANAEROBIC BCAV  Final   Culture NO GROWTH < 24 HOURS  Final   Report Status PENDING  Incomplete  Blood Culture (routine x 2)     Status: None (Preliminary result)   Collection Time: 05/19/2016  4:42 PM  Result Value Ref Range Status   Specimen Description BLOOD RIGHT NECK  Final   Special Requests   Final    BOTTLES DRAWN AEROBIC AND ANAEROBIC Blood Culture results may not be optimal due to an excessive volume of blood received in culture bottles   Culture NO GROWTH < 24 HOURS  Final   Report Status PENDING  Incomplete  MRSA PCR Screening     Status: None   Collection Time: 05/15/2016  8:35 PM  Result Value Ref Range Status   MRSA by PCR NEGATIVE NEGATIVE Final    Comment:        The GeneXpert MRSA Assay (FDA approved for NASAL specimens only), is one component of a comprehensive MRSA colonization surveillance program. It is not intended to diagnose MRSA infection nor to guide or monitor treatment for MRSA infections.     Coagulation Studies:  Recent Labs  06/02/2016 2129 05/30/16 0529  LABPROT 27.7* 27.9*  INR 2.53 2.55    Urinalysis:  Recent Labs  06/03/2016 1700  COLORURINE AMBER*  LABSPEC 1.017  PHURINE 5.0  GLUCOSEU 50*  HGBUR MODERATE*  BILIRUBINUR NEGATIVE  KETONESUR NEGATIVE  PROTEINUR  30*  NITRITE NEGATIVE  LEUKOCYTESUR NEGATIVE      Imaging: Ct Abdomen Pelvis Wo Contrast  Result Date: 06/01/2016 CLINICAL DATA:  Unresponsive.  Septic shock. EXAM: CT ABDOMEN AND PELVIS WITHOUT CONTRAST TECHNIQUE: Multidetector CT imaging of the abdomen and pelvis was performed following the standard protocol without IV contrast. COMPARISON:  Right upper quadrant and pelvic ultrasounds 05/26/2016 FINDINGS: Lower chest: Partially visualized right lower lobe consolidation with more patchy opacities elsewhere in both lower lobes more inferiorly. Likely trace bilateral pleural effusions, with evaluation limited by streak artifact. Partially visualize coronary artery atherosclerosis. Hepatobiliary: No focal liver abnormality is seen. Tiny stones layer in the gallbladder without evidence of wall thickening or pericholecystic inflammatory change. No biliary  dilatation. Pancreas: Mild diffuse atrophy. Punctate calcifications at the level of the inferior pancreatic head which may be parenchymal (such as from past pancreatitis) or vascular. No pancreatic or biliary ductal dilatation to strongly suggest that these are ductal calculi. No peripancreatic inflammation. Spleen: Unremarkable. Adrenals/Urinary Tract: Unremarkable adrenal glands. No evidence of renal mass, calculi, or hydronephrosis. Bladder decompressed by a Foley catheter. Stomach/Bowel: Enteric tube terminates in the distal stomach. A moderate amount of fluid is present in the stomach. Fluid is present in scattered loops of nondilated small and large bowel including in the rectum. There is no evidence of bowel obstruction, and no gross bowel wall thickening is identified. The appendix is unremarkable. Vascular/Lymphatic: Abdominal aortic atherosclerosis without aneurysm. No enlarged lymph nodes. Reproductive: Small calcification in the uterine fundus, otherwise unremarkable appearance of the uterus and ovaries. Other: No intraperitoneal free fluid. Small  fat containing umbilical hernia. Abdominal wall scarring in the midline inferior to the umbilicus. Musculoskeletal: Advanced thoracic and lumbar spine disc degeneration and advanced lumbar facet arthropathy. IMPRESSION: 1. Bibasilar pulmonary airspace opacities including partially visualized right lower lobe consolidation which may reflect pneumonia. 2. No definite acute abnormality identified in the abdomen or pelvis. 3. Cholelithiasis. 4. Other incidental findings as above. 5.  Aortic Atherosclerosis (ICD10-I70.0). Electronically Signed   By: Logan Bores M.D.   On: 05/17/2016 18:03   Dg Chest 1 View  Result Date: 05/25/2016 CLINICAL DATA:  Central line EXAM: CHEST 1 VIEW COMPARISON:  06/03/2016 FINDINGS: Repositioned endotracheal tube, the tip is approximately 4.2 cm superior to the carina. Esophageal tube tip is below the diaphragm. Right IJ central venous catheter tip faintly visualized over the proximal right atrium. No pneumothorax. Cardiomegaly with mild central congestion. Patchy perihilar and basilar opacity. IMPRESSION: 1. Repositioning of endotracheal tube 2. Right IJ central venous catheter tip overlies proximal right atrium, no pneumothorax 3. Low lung volumes. Slight increased patchy perihilar opacity compared to prior which may reflect increasing atelectasis or vascular congestion. Electronically Signed   By: Donavan Foil M.D.   On: 05/08/2016 17:09   Ct Head Wo Contrast  Result Date: 06/03/2016 CLINICAL DATA:  Unresponsive EXAM: CT HEAD WITHOUT CONTRAST TECHNIQUE: Contiguous axial images were obtained from the base of the skull through the vertex without intravenous contrast. COMPARISON:  None. FINDINGS: Brain: No acute territorial infarction, hemorrhage or intracranial mass is seen. Prominent extra-axial CSF spaces anteriorly likely related to atrophy. Ventricles are nonenlarged. Vascular: No hyperdense vessels. Carotid artery calcification and vertebral artery calcification. Skull: No  fracture or suspicious bone lesion Sinuses/Orbits: Mucosal thickening in the maxillary and ethmoid sinuses. No acute orbital abnormality. Bilateral lens extraction. Other: None IMPRESSION: No CT evidence for acute intracranial abnormality. Electronically Signed   By: Donavan Foil M.D.   On: 05/15/2016 17:52   Dg Chest Port 1 View  Result Date: 05/10/2016 CLINICAL DATA:  ETT placement EXAM: PORTABLE CHEST 1 VIEW COMPARISON:  05/15/2016 FINDINGS: Endotracheal tube tip encroaches right mainstem bronchus orifice. Esophageal tube tip is below the diaphragm but not well visualized. Low lung volumes with subsegmental atelectasis at both bases. Borderline to mild cardiomegaly. No edema or infiltrate. IMPRESSION: Endotracheal tube tip encroaches the right mainstem bronchus orifice. Low lung volumes with subsegmental atelectasis at the bases. Electronically Signed   By: Donavan Foil M.D.   On: 05/18/2016 15:59   Dg Abd Portable 1 View  Result Date: 05/14/2016 CLINICAL DATA:  OG tube placement EXAM: PORTABLE ABDOMEN - 1 VIEW COMPARISON:  None. FINDINGS: Orogastric tube with the  tip projecting over the antrum of the stomach. There is no bowel dilatation to suggest obstruction. There is no evidence of pneumoperitoneum, portal venous gas or pneumatosis. There are no pathologic calcifications along the expected course of the ureters. The osseous structures are unremarkable. IMPRESSION: Orogastric tube with the tip projecting over the antrum of the stomach. Electronically Signed   By: Kathreen Devoid   On: 05/24/2016 15:59      Assessment & Plan: Ms. Len Kluver is a 61 y.o. white female with diabetes mellitus type II insulin dependent, hypertension, coronary artery disease, hyperlipidemia, peripheral vascular disease, atrial fibrillationwho was admitted [facility]  1. Acute renal failure with metabolic acidosis: anuric. Requiring vasopressors. Most likely ATN from sepsis, hypotension, shock.  -  Patient will need renal replacement therapy. Discussed with husband.  - Continue IV fluids and supportive care.   2. Hyponatremia: with hyperglycemia - Monitor with initiation of dialysis.   3. Hypocalcemia: with hyperphosphatemia - IV albumin   4. Sepsis/shock: requiring vasopressors - Empiric vanco and cefepime.   Discussed case with Dr. Alva Garnet.   LOS: 1 Lakaya Tolen 5/23/20189:38 AM

## 2016-05-31 ENCOUNTER — Inpatient Hospital Stay: Payer: BLUE CROSS/BLUE SHIELD

## 2016-05-31 DIAGNOSIS — J9620 Acute and chronic respiratory failure, unspecified whether with hypoxia or hypercapnia: Secondary | ICD-10-CM

## 2016-05-31 DIAGNOSIS — G9340 Encephalopathy, unspecified: Secondary | ICD-10-CM

## 2016-05-31 LAB — GLUCOSE, CAPILLARY
GLUCOSE-CAPILLARY: 202 mg/dL — AB (ref 65–99)
GLUCOSE-CAPILLARY: 125 mg/dL — AB (ref 65–99)
Glucose-Capillary: 232 mg/dL — ABNORMAL HIGH (ref 65–99)
Glucose-Capillary: 243 mg/dL — ABNORMAL HIGH (ref 65–99)
Glucose-Capillary: 227 mg/dL — ABNORMAL HIGH (ref 65–99)
Glucose-Capillary: 195 mg/dL — ABNORMAL HIGH (ref 65–99)
Glucose-Capillary: 179 mg/dL — ABNORMAL HIGH (ref 65–99)
Glucose-Capillary: 180 mg/dL — ABNORMAL HIGH (ref 65–99)
Glucose-Capillary: 135 mg/dL — ABNORMAL HIGH (ref 65–99)
Glucose-Capillary: 122 mg/dL — ABNORMAL HIGH (ref 65–99)
Glucose-Capillary: 129 mg/dL — ABNORMAL HIGH (ref 65–99)
Glucose-Capillary: 129 mg/dL — ABNORMAL HIGH (ref 65–99)
Glucose-Capillary: 136 mg/dL — ABNORMAL HIGH (ref 65–99)

## 2016-05-31 LAB — PHOSPHORUS: Phosphorus: 4.8 mg/dL — ABNORMAL HIGH (ref 2.5–4.6)

## 2016-05-31 LAB — RENAL FUNCTION PANEL
ALBUMIN: 2 g/dL — AB (ref 3.5–5.0)
ALBUMIN: 2 g/dL — AB (ref 3.5–5.0)
ALBUMIN: 2 g/dL — AB (ref 3.5–5.0)
ANION GAP: 10 (ref 5–15)
ANION GAP: 9 (ref 5–15)
ANION GAP: 9 (ref 5–15)
Albumin: 2 g/dL — ABNORMAL LOW (ref 3.5–5.0)
Albumin: 2 g/dL — ABNORMAL LOW (ref 3.5–5.0)
Anion gap: 13 (ref 5–15)
Anion gap: 5 (ref 5–15)
BUN: 63 mg/dL — AB (ref 6–20)
BUN: 49 mg/dL — AB (ref 6–20)
BUN: 46 mg/dL — ABNORMAL HIGH (ref 6–20)
BUN: 43 mg/dL — AB (ref 6–20)
BUN: 39 mg/dL — ABNORMAL HIGH (ref 6–20)
CALCIUM: 7.5 mg/dL — AB (ref 8.9–10.3)
CALCIUM: 8.1 mg/dL — AB (ref 8.9–10.3)
CALCIUM: 8.1 mg/dL — AB (ref 8.9–10.3)
CHLORIDE: 99 mmol/L — AB (ref 101–111)
CHLORIDE: 102 mmol/L (ref 101–111)
CO2: 22 mmol/L (ref 22–32)
CO2: 25 mmol/L (ref 22–32)
CO2: 26 mmol/L (ref 22–32)
CO2: 26 mmol/L (ref 22–32)
CO2: 25 mmol/L (ref 22–32)
CREATININE: 2.74 mg/dL — AB (ref 0.44–1.00)
CREATININE: 2.23 mg/dL — AB (ref 0.44–1.00)
CREATININE: 2.13 mg/dL — AB (ref 0.44–1.00)
Calcium: 7.3 mg/dL — ABNORMAL LOW (ref 8.9–10.3)
Calcium: 8.2 mg/dL — ABNORMAL LOW (ref 8.9–10.3)
Chloride: 102 mmol/L (ref 101–111)
Chloride: 100 mmol/L — ABNORMAL LOW (ref 101–111)
Chloride: 104 mmol/L (ref 101–111)
Creatinine, Ser: 2.09 mg/dL — ABNORMAL HIGH (ref 0.44–1.00)
Creatinine, Ser: 1.96 mg/dL — ABNORMAL HIGH (ref 0.44–1.00)
GFR calc Af Amer: 26 mL/min — ABNORMAL LOW (ref >60)
GFR calc Af Amer: 28 mL/min — ABNORMAL LOW (ref >60)
GFR calc Af Amer: 30 mL/min — ABNORMAL LOW (ref >60)
GFR calc non Af Amer: 17 mL/min — ABNORMAL LOW (ref >60)
GFR calc non Af Amer: 22 mL/min — ABNORMAL LOW (ref >60)
GFR calc non Af Amer: 26 mL/min — ABNORMAL LOW (ref >60)
GFR, EST AFRICAN AMERICAN: 20 mL/min — AB (ref >60)
GFR, EST AFRICAN AMERICAN: 27 mL/min — AB (ref >60)
GFR, EST NON AFRICAN AMERICAN: 24 mL/min — AB (ref >60)
GFR, EST NON AFRICAN AMERICAN: 24 mL/min — AB (ref >60)
GLUCOSE: 218 mg/dL — AB (ref 65–99)
GLUCOSE: 143 mg/dL — AB (ref 65–99)
Glucose, Bld: 257 mg/dL — ABNORMAL HIGH (ref 65–99)
Glucose, Bld: 171 mg/dL — ABNORMAL HIGH (ref 65–99)
Glucose, Bld: 164 mg/dL — ABNORMAL HIGH (ref 65–99)
PHOSPHORUS: 5.7 mg/dL — AB (ref 2.5–4.6)
PHOSPHORUS: 3.9 mg/dL (ref 2.5–4.6)
PHOSPHORUS: 3.5 mg/dL (ref 2.5–4.6)
Phosphorus: 3.6 mg/dL (ref 2.5–4.6)
Phosphorus: 3.3 mg/dL (ref 2.5–4.6)
Potassium: 3.1 mmol/L — ABNORMAL LOW (ref 3.5–5.1)
Potassium: 2.9 mmol/L — ABNORMAL LOW (ref 3.5–5.1)
Potassium: 3 mmol/L — ABNORMAL LOW (ref 3.5–5.1)
Potassium: 3.1 mmol/L — ABNORMAL LOW (ref 3.5–5.1)
Potassium: 3.6 mmol/L (ref 3.5–5.1)
SODIUM: 137 mmol/L (ref 135–145)
SODIUM: 135 mmol/L (ref 135–145)
Sodium: 134 mmol/L — ABNORMAL LOW (ref 135–145)
Sodium: 137 mmol/L (ref 135–145)
Sodium: 134 mmol/L — ABNORMAL LOW (ref 135–145)

## 2016-05-31 LAB — COMPREHENSIVE METABOLIC PANEL
ALT: 26 U/L (ref 14–54)
ANION GAP: 11 (ref 5–15)
AST: 39 U/L (ref 15–41)
Albumin: 1.9 g/dL — ABNORMAL LOW (ref 3.5–5.0)
Alkaline Phosphatase: 82 U/L (ref 38–126)
BUN: 57 mg/dL — ABNORMAL HIGH (ref 6–20)
CALCIUM: 7.4 mg/dL — AB (ref 8.9–10.3)
CHLORIDE: 99 mmol/L — AB (ref 101–111)
CO2: 25 mmol/L (ref 22–32)
Creatinine, Ser: 2.55 mg/dL — ABNORMAL HIGH (ref 0.44–1.00)
GFR calc non Af Amer: 19 mL/min — ABNORMAL LOW (ref >60)
GFR, EST AFRICAN AMERICAN: 22 mL/min — AB (ref >60)
Glucose, Bld: 248 mg/dL — ABNORMAL HIGH (ref 65–99)
Potassium: 3 mmol/L — ABNORMAL LOW (ref 3.5–5.1)
SODIUM: 135 mmol/L (ref 135–145)
Total Bilirubin: 2.9 mg/dL — ABNORMAL HIGH (ref 0.3–1.2)
Total Protein: 4.5 g/dL — ABNORMAL LOW (ref 6.5–8.1)

## 2016-05-31 LAB — CBC
HCT: 34.6 % — ABNORMAL LOW (ref 35.0–47.0)
Hemoglobin: 11.9 g/dL — ABNORMAL LOW (ref 12.0–16.0)
MCH: 31.7 pg (ref 26.0–34.0)
MCHC: 34.3 g/dL (ref 32.0–36.0)
MCV: 92.5 fL (ref 80.0–100.0)
PLATELETS: 151 10*3/uL (ref 150–440)
RBC: 3.74 MIL/uL — AB (ref 3.80–5.20)
RDW: 13.5 % (ref 11.5–14.5)
WBC: 9.3 10*3/uL (ref 3.6–11.0)

## 2016-05-31 LAB — URINE CULTURE: Culture: >=100000 — AB

## 2016-05-31 LAB — MAGNESIUM
MAGNESIUM: 1.6 mg/dL — AB (ref 1.7–2.4)
Magnesium: 1.9 mg/dL (ref 1.7–2.4)
Magnesium: 2.2 mg/dL (ref 1.7–2.4)
Magnesium: 1.9 mg/dL (ref 1.7–2.4)
Magnesium: 1.8 mg/dL (ref 1.7–2.4)

## 2016-05-31 LAB — APTT: APTT: 53 s — AB (ref 24–36)

## 2016-05-31 LAB — HEMOGLOBIN A1C
HEMOGLOBIN A1C: 9 % — AB (ref 4.8–5.6)
Mean Plasma Glucose: 212 mg/dL

## 2016-05-31 LAB — PROCALCITONIN: PROCALCITONIN: 5.03 ng/mL

## 2016-05-31 MED ORDER — MAGNESIUM SULFATE 2 GM/50ML IV SOLN
2.0000 g | Freq: Once | INTRAVENOUS | Status: AC
  Administered 2016-05-31: 2 g via INTRAVENOUS
  Filled 2016-05-31: qty 50

## 2016-05-31 MED ORDER — VITAL HIGH PROTEIN PO LIQD
1000.0000 mL | ORAL | Status: DC
  Administered 2016-05-31: 20:00:00
  Administered 2016-05-31: 1000 mL

## 2016-05-31 MED ORDER — INSULIN ASPART 100 UNIT/ML ~~LOC~~ SOLN
0.0000 [IU] | SUBCUTANEOUS | Status: DC
  Administered 2016-05-31 – 2016-06-01 (×7): 3 [IU] via SUBCUTANEOUS
  Administered 2016-06-01 – 2016-06-02 (×3): 4 [IU] via SUBCUTANEOUS
  Administered 2016-06-02 (×2): 3 [IU] via SUBCUTANEOUS
  Administered 2016-06-02 – 2016-06-03 (×4): 4 [IU] via SUBCUTANEOUS
  Administered 2016-06-03: 7 [IU] via SUBCUTANEOUS
  Administered 2016-06-03 – 2016-06-04 (×6): 4 [IU] via SUBCUTANEOUS
  Administered 2016-06-04: 7 [IU] via SUBCUTANEOUS
  Administered 2016-06-04 – 2016-06-05 (×6): 4 [IU] via SUBCUTANEOUS
  Administered 2016-06-05 – 2016-06-06 (×2): 7 [IU] via SUBCUTANEOUS
  Administered 2016-06-06: 4 [IU] via SUBCUTANEOUS
  Administered 2016-06-06: 7 [IU] via SUBCUTANEOUS
  Filled 2016-05-31: qty 4
  Filled 2016-05-31: qty 7
  Filled 2016-05-31 (×6): qty 4
  Filled 2016-05-31: qty 3
  Filled 2016-05-31: qty 4
  Filled 2016-05-31: qty 3
  Filled 2016-05-31: qty 4
  Filled 2016-05-31: qty 7
  Filled 2016-05-31: qty 4
  Filled 2016-05-31 (×3): qty 3
  Filled 2016-05-31: qty 7
  Filled 2016-05-31: qty 4
  Filled 2016-05-31: qty 3
  Filled 2016-05-31 (×4): qty 4
  Filled 2016-05-31: qty 7
  Filled 2016-05-31 (×2): qty 3
  Filled 2016-05-31 (×4): qty 4
  Filled 2016-05-31: qty 3
  Filled 2016-05-31: qty 4
  Filled 2016-05-31: qty 7

## 2016-05-31 MED ORDER — PUREFLOW DIALYSIS SOLUTION
INTRAVENOUS | Status: DC
  Administered 2016-05-31 – 2016-06-03 (×7): 3 via INTRAVENOUS_CENTRAL
  Administered 2016-06-03 – 2016-06-04 (×2): via INTRAVENOUS_CENTRAL
  Administered 2016-06-04: 3 via INTRAVENOUS_CENTRAL
  Administered 2016-06-04 – 2016-06-05 (×2): via INTRAVENOUS_CENTRAL

## 2016-05-31 MED ORDER — POTASSIUM CHLORIDE 10 MEQ/50ML IV SOLN
10.0000 meq | INTRAVENOUS | Status: AC
  Administered 2016-05-31 (×3): 10 meq via INTRAVENOUS
  Filled 2016-05-31 (×3): qty 50

## 2016-05-31 MED ORDER — PANTOPRAZOLE SODIUM 40 MG PO PACK
40.0000 mg | PACK | Freq: Every day | ORAL | Status: DC
  Administered 2016-05-31 – 2016-06-05 (×6): 40 mg
  Filled 2016-05-31 (×8): qty 20

## 2016-05-31 MED ORDER — SODIUM CHLORIDE 0.9 % IV SOLN
INTRAVENOUS | Status: DC
  Administered 2016-05-31: 1.4 [IU]/h via INTRAVENOUS
  Filled 2016-05-31: qty 1

## 2016-05-31 NOTE — Progress Notes (Signed)
Pharmacy Antibiotic Note/CRRT Medication Adjusment  Laura Kline is a 61 y.o. female with a recent I&D of left foot abscess admitted on 01/03/2017 with sepsis.  Pharmacy has been consulted for vancomycin and cefepime dosing. Patient is beginning CRRT for acute renal failure.   Plan: 1. Will continue vancomycin 1000 mg iv q 24 hours and plan on checking a level 5/25 at 2030. Goal vancomycin level= 15-25 mcg/ml.   Will continue cefepime 2 g iv q 12 hours.   2. Aside from antibiotics, no medications require adjustment for CRRT at present.   Height: 5' (152.4 cm) Weight: (!) 315 lb 7.7 oz (143.1 kg) IBW/kg (Calculated) : 45.5  Temp (24hrs), Avg:98 F (36.7 C), Min:97.8 F (36.6 C), Max:98.4 F (36.9 C)   Recent Labs Lab 05/25/16 2252 05/27/16 0643  June 23, 2016 1503 June 23, 2016 1900  05/30/16 0529 05/30/16 2228 05/31/16 0217 05/31/16 0541 05/31/16 1117  WBC 3.6 2.9*  --  19.8*  --   --  10.3  --   --  9.3  --   CREATININE 2.67* 1.55*  < > 3.42*  --   < > 3.07* 2.97* 2.74* 2.55* 2.23*  LATICACIDVEN 1.6  --   --  4.8* 2.1*  --   --   --   --   --   --   < > = values in this interval not displayed.  Estimated Creatinine Clearance: 34.9 mL/min (A) (by C-G formula based on SCr of 2.23 mg/dL (H)).    Allergies  Allergen Reactions  . Rofecoxib Other (See Comments)    Reaction: unknown  . Penicillins Rash and Other (See Comments)    Has patient had a PCN reaction causing immediate rash, facial/tongue/throat swelling, SOB or lightheadedness with hypotension: Unknown Has patient had a PCN reaction causing severe rash involving mucus membranes or skin necrosis: Unknown Has patient had a PCN reaction that required hospitalization: Unknown Has patient had a PCN reaction occurring within the last 10 years: Unknown If all of the above answers are "NO", then may proceed with Cephalosporin use.     Antimicrobials this admission: vanc 5/22 >>  levofloxacin 5/22 >> 5/23 Cefepime 5/22  >> Metronidazole 5/23 >> Doxycycline >>  Dose adjustments this admission:  Microbiology results: BCx: NGTD TA: GPC, GVR UCx: yeast MRSA PCR: negative  Thank you for allowing pharmacy to be a part of this patient's care.  Luisa HartScott Virlan Kempker, PharmD Clinical Pharmacist  05/31/2016 2:10 PM

## 2016-05-31 NOTE — Progress Notes (Signed)
[facility]  ROUNDING NOTE   Subjective:   CRRT 4K bath Norepinephrine gtt Vasopressin gtt  UF 547 Net +2.6    Objective:  Vital signs in last 24 hours:  Temp:  [97.8 F (36.6 C)-98.4 F (36.9 C)] 97.9 F (36.6 C) (05/24 0815) Pulse Rate:  [32-107] 103 (05/24 1030) Resp:  [10-21] 13 (05/24 1030) BP: (58-135)/(34-120) 103/69 (05/24 1030) SpO2:  [91 %-100 %] 100 % (05/24 1030) FiO2 (%):  [30 %-40 %] 30 % (05/24 0837) Weight:  [143.1 kg (315 lb 7.7 oz)] 143.1 kg (315 lb 7.7 oz) (05/24 0500)  Weight change: 7.021 kg (15 lb 7.7 oz) Filed Weights   06/07/2016 2050 05/30/16 0500 05/31/16 0500  Weight: 135.3 kg (298 lb 4.5 oz) 135.5 kg (298 lb 11.6 oz) (!) 143.1 kg (315 lb 7.7 oz)    Intake/Output: I/O last 3 completed shifts: In: 7030.8 [I.V.:6830.8; IV Piggyback:200] Out: 4854 [Urine:110; Emesis/NG output:900; Other:594]   Intake/Output this shift:  Total I/O In: 492 [I.V.:392; IV Piggyback:100] Out: 129 [Other:129]  Physical Exam: General: Critically ill  Head: ETT NGT  Eyes: Anicteric, PERRL  Neck: RIJ temp HD catheter, left subclavian  Lungs:  Diminished bilaterally, Pressure support FiO2 30%  Heart: irregular  Abdomen:  Soft, nontender, obese  Extremities:  ++ peripheral edema.  Neurologic: Intubated, sedated  Skin: Left foot incision - clean, dry and intact  Access: RIJ temp HD catheter Dr. Alva Garnet 6/27    Basic Metabolic Panel:  Recent Labs Lab 05/20/2016 2050 05/30/16 0529 05/30/16 2228 05/31/16 0217 05/31/16 0541  NA 127* 133* 134* 134* 135  K 4.8 3.0* 3.1* 3.1* 3.0*  CL 100* 99* 99* 99* 99*  CO2 12* 20* 20* 22 25  GLUCOSE 482* 379* 242* 257* 248*  BUN 85* 80* 73* 63* 57*  CREATININE 3.39* 3.07* 2.97* 2.74* 2.55*  CALCIUM 7.0* 6.5* 7.0* 7.3* 7.4*  MG 2.0 1.6* 1.5*  --  1.6*  PHOS 10.6* 7.5* 6.2* 5.7* 4.8*    Liver Function Tests:  Recent Labs Lab 05/25/16 2252 05/27/16 0643 05/20/2016 1503 05/30/16 0529 05/30/16 2228  05/31/16 0217 05/31/16 0541  AST 140* 29 34 44*  --   --  39  ALT 208* 89* 30 29  --   --  26  ALKPHOS 139* 121 89 81  --   --  82  BILITOT 1.5* 1.4* 3.0* 4.0*  --   --  2.9*  PROT 5.7* 4.7* 5.3* 4.5*  --   --  4.5*  ALBUMIN 2.2* 1.9* 2.1* 1.8* 1.6* 2.0* 1.9*    Recent Labs Lab 05/11/2016 1503 05/28/2016 2050  LIPASE 16  --   AMYLASE  --  71   No results for input(s): AMMONIA in the last 168 hours.  CBC:  Recent Labs Lab 05/25/16 2252 05/27/16 0643 05/15/2016 1503 05/30/16 0529 05/31/16 0541  WBC 3.6 2.9* 19.8* 10.3 9.3  NEUTROABS 2.1  --  14.0*  --   --   HGB 12.0 11.7* 14.4 13.4 11.9*  HCT 35.1 34.9* 44.6 40.2 34.6*  MCV 92.8 92.9 95.5 92.9 92.5  PLT 101* 109* 438 455* 151    Cardiac Enzymes:  Recent Labs Lab 05/14/2016 1503  TROPONINI 0.04*    BNP: Invalid input(s): POCBNP  CBG:  Recent Labs Lab 05/30/16 2039 05/30/16 2145 05/30/16 2353 05/31/16 0348 05/31/16 0726  GLUCAP 135* 184* 213* 232* 243*    Microbiology: Results for orders placed or performed during the hospital encounter of 06/07/2016  Blood Culture (  routine x 2)     Status: None (Preliminary result)   Collection Time: 05/24/2016  3:03 PM  Result Value Ref Range Status   Specimen Description BLOOD L AC  Final   Special Requests BOTTLES DRAWN AEROBIC AND ANAEROBIC BCAV  Final   Culture NO GROWTH 2 DAYS  Final   Report Status PENDING  Incomplete  Blood Culture (routine x 2)     Status: None (Preliminary result)   Collection Time: 05/26/2016  4:42 PM  Result Value Ref Range Status   Specimen Description BLOOD RIGHT NECK  Final   Special Requests   Final    BOTTLES DRAWN AEROBIC AND ANAEROBIC Blood Culture results may not be optimal due to an excessive volume of blood received in culture bottles   Culture NO GROWTH 2 DAYS  Final   Report Status PENDING  Incomplete  MRSA PCR Screening     Status: None   Collection Time: 05/30/2016  8:35 PM  Result Value Ref Range Status   MRSA by PCR NEGATIVE  NEGATIVE Final    Comment:        The GeneXpert MRSA Assay (FDA approved for NASAL specimens only), is one component of a comprehensive MRSA colonization surveillance program. It is not intended to diagnose MRSA infection nor to guide or monitor treatment for MRSA infections.   Urine culture     Status: Abnormal   Collection Time: 05/30/16  1:51 AM  Result Value Ref Range Status   Specimen Description URINE, RANDOM  Final   Special Requests NONE  Final   Culture >=100,000 COLONIES/mL YEAST (A)  Final   Report Status 05/31/2016 FINAL  Final  Culture, respiratory (NON-Expectorated)     Status: None (Preliminary result)   Collection Time: 05/30/16  2:45 AM  Result Value Ref Range Status   Specimen Description TRACHEAL ASPIRATE  Final   Special Requests NONE  Final   Gram Stain   Final    FEW WBC PRESENT, PREDOMINANTLY MONONUCLEAR MODERATE YEAST FEW GRAM POSITIVE COCCI RARE GRAM VARIABLE ROD    Culture   Final    CULTURE REINCUBATED FOR BETTER GROWTH Performed at [facility], [place] [address], [place], [place] [address]    Report Status PENDING  Incomplete    Coagulation Studies:  Recent Labs  05/24/2016 2129 05/30/16 0529  LABPROT 27.7* 27.9*  INR 2.53 2.55    Urinalysis:  Recent Labs  06/04/2016 1700  COLORURINE AMBER*  LABSPEC 1.017  PHURINE 5.0  GLUCOSEU 50*  HGBUR MODERATE*  BILIRUBINUR NEGATIVE  KETONESUR NEGATIVE  PROTEINUR 30*  NITRITE NEGATIVE  LEUKOCYTESUR NEGATIVE      Imaging: Ct Abdomen Pelvis Wo Contrast  Result Date: 06/03/2016 CLINICAL DATA:  Unresponsive.  Septic shock. EXAM: CT ABDOMEN AND PELVIS WITHOUT CONTRAST TECHNIQUE: Multidetector CT imaging of the abdomen and pelvis was performed following the standard protocol without IV contrast. COMPARISON:  Right upper quadrant and pelvic ultrasounds 05/26/2016 FINDINGS: Lower chest: Partially visualized right lower lobe consolidation with more patchy opacities elsewhere in both lower  lobes more inferiorly. Likely trace bilateral pleural effusions, with evaluation limited by streak artifact. Partially visualize coronary artery atherosclerosis. Hepatobiliary: No focal liver abnormality is seen. Tiny stones layer in the gallbladder without evidence of wall thickening or pericholecystic inflammatory change. No biliary dilatation. Pancreas: Mild diffuse atrophy. Punctate calcifications at the level of the inferior pancreatic head which may be parenchymal (such as from past pancreatitis) or vascular. No pancreatic or biliary ductal dilatation to strongly suggest  that these are ductal calculi. No peripancreatic inflammation. Spleen: Unremarkable. Adrenals/Urinary Tract: Unremarkable adrenal glands. No evidence of renal mass, calculi, or hydronephrosis. Bladder decompressed by a Foley catheter. Stomach/Bowel: Enteric tube terminates in the distal stomach. A moderate amount of fluid is present in the stomach. Fluid is present in scattered loops of nondilated small and large bowel including in the rectum. There is no evidence of bowel obstruction, and no gross bowel wall thickening is identified. The appendix is unremarkable. Vascular/Lymphatic: Abdominal aortic atherosclerosis without aneurysm. No enlarged lymph nodes. Reproductive: Small calcification in the uterine fundus, otherwise unremarkable appearance of the uterus and ovaries. Other: No intraperitoneal free fluid. Small fat containing umbilical hernia. Abdominal wall scarring in the midline inferior to the umbilicus. Musculoskeletal: Advanced thoracic and lumbar spine disc degeneration and advanced lumbar facet arthropathy. IMPRESSION: 1. Bibasilar pulmonary airspace opacities including partially visualized right lower lobe consolidation which may reflect pneumonia. 2. No definite acute abnormality identified in the abdomen or pelvis. 3. Cholelithiasis. 4. Other incidental findings as above. 5.  Aortic Atherosclerosis (ICD10-I70.0).  Electronically Signed   By: Logan Bores M.D.   On: 05/31/2016 18:03   Dg Chest 1 View  Result Date: 06/04/2016 CLINICAL DATA:  Central line EXAM: CHEST 1 VIEW COMPARISON:  06/01/2016 FINDINGS: Repositioned endotracheal tube, the tip is approximately 4.2 cm superior to the carina. Esophageal tube tip is below the diaphragm. Right IJ central venous catheter tip faintly visualized over the proximal right atrium. No pneumothorax. Cardiomegaly with mild central congestion. Patchy perihilar and basilar opacity. IMPRESSION: 1. Repositioning of endotracheal tube 2. Right IJ central venous catheter tip overlies proximal right atrium, no pneumothorax 3. Low lung volumes. Slight increased patchy perihilar opacity compared to prior which may reflect increasing atelectasis or vascular congestion. Electronically Signed   By: Donavan Foil M.D.   On: 05/15/2016 17:09   Ct Head Wo Contrast  Result Date: 05/24/2016 CLINICAL DATA:  Unresponsive EXAM: CT HEAD WITHOUT CONTRAST TECHNIQUE: Contiguous axial images were obtained from the base of the skull through the vertex without intravenous contrast. COMPARISON:  None. FINDINGS: Brain: No acute territorial infarction, hemorrhage or intracranial mass is seen. Prominent extra-axial CSF spaces anteriorly likely related to atrophy. Ventricles are nonenlarged. Vascular: No hyperdense vessels. Carotid artery calcification and vertebral artery calcification. Skull: No fracture or suspicious bone lesion Sinuses/Orbits: Mucosal thickening in the maxillary and ethmoid sinuses. No acute orbital abnormality. Bilateral lens extraction. Other: None IMPRESSION: No CT evidence for acute intracranial abnormality. Electronically Signed   By: Donavan Foil M.D.   On: 05/24/2016 17:52   US Renal  Result Date: 05/30/2016 CLINICAL DATA:  Acute renal insufficiency. EXAM: RENAL / URINARY TRACT ULTRASOUND COMPLETE COMPARISON:  CT scan 05/11/2016 FINDINGS: Right Kidney: Length: 10.8 cm. Mild renal  cortical thinning but normal echogenicity. No focal lesions, renal calculi or hydronephrosis. Left Kidney: Length: 13.3 cm. Normal renal cortical thickness and echogenicity. No focal lesions, renal calculi or hydronephrosis. Bladder: Decompressed by a Foley catheter. Other: Cholelithiasis. IMPRESSION: Mild renal cortical thinning of the right kidney but normal echogenicity. No hydronephrosis. Electronically Signed   By: Marijo Sanes M.D.   On: 05/30/2016 10:40   Dg Chest Port 1 View  Result Date: 05/31/2016 CLINICAL DATA:  Respiratory failure. EXAM: PORTABLE CHEST 1 VIEW COMPARISON:  05/30/2016. FINDINGS: Endotracheal tube, NG tube, right IJ line, left subclavian line in stable position. Heart size stable. Previously identified linear lucency over the right chest no longer identified. Low lung volumes with basilar atelectasis. IMPRESSION: 1.  Lines and tubes in stable position.  No pneumothorax. 2. Low lung volumes with basilar atelectasis. Chest is stable from prior exam. Electronically Signed   By: Marcello Moores  Register   On: 05/31/2016 06:38   Dg Chest Port 1 View  Result Date: 05/30/2016 CLINICAL DATA:  61 year old female undergoing right IJ catheter exchange EXAM: PORTABLE CHEST 1 VIEW COMPARISON:  Chest x-ray obtained earlier today at 16:11 p.m. FINDINGS: The right IJ central venous catheter has been exchanged for a non tunneled hemodialysis catheter. The catheter tip projects over the superior cavoatrial junction. Stable position of left subclavian approach central venous catheter with the tip also at the superior cavoatrial junction. The patient remains intubated. The tip of the endotracheal tube is 2.5 cm above the carina. A gastric tube is present, the tip lies off the field of view below the diaphragm presumably within the stomach. Stable cardiac and mediastinal contours. A linear vertically oriented lucency in the right mid to upper lung remains present and unchanged. This is of indeterminate clinical  significance. Respiratory volumes remain very low and there is bibasilar atelectasis. No overt pulmonary edema. No acute osseous abnormality. IMPRESSION: 1. The right IJ central venous catheter has been exchanged for a non tunneled hemodialysis catheter the tip of which projects over the superior cavoatrial junction. 2. Other support apparatus in stable and satisfactory position. 3. No significant interval change in the appearance of the lungs with persistent very low volumes and bibasilar atelectasis. Electronically Signed   By: Jacqulynn Cadet M.D.   On: 05/30/2016 17:18   Dg Chest Port 1 View  Result Date: 05/30/2016 CLINICAL DATA:  Status post central line placement EXAM: PORTABLE CHEST 1 VIEW COMPARISON:  05/21/2016 FINDINGS: Cardiac shadow is stable. Endotracheal tube and nasogastric catheter are again seen and stable. Right jugular central line is again seen and stable. A new left subclavian central line is noted with the catheter tip in the proximal superior vena cava. No pneumothorax is noted. No bony abnormality is seen. IMPRESSION: Status post left central venous line without pneumothorax. The remainder the exam is stable from the prior study. Electronically Signed   By: Inez Catalina M.D.   On: 05/30/2016 16:27   Dg Chest Port 1 View  Result Date: 05/21/2016 CLINICAL DATA:  ETT placement EXAM: PORTABLE CHEST 1 VIEW COMPARISON:  05/15/2016 FINDINGS: Endotracheal tube tip encroaches right mainstem bronchus orifice. Esophageal tube tip is below the diaphragm but not well visualized. Low lung volumes with subsegmental atelectasis at both bases. Borderline to mild cardiomegaly. No edema or infiltrate. IMPRESSION: Endotracheal tube tip encroaches the right mainstem bronchus orifice. Low lung volumes with subsegmental atelectasis at the bases. Electronically Signed   By: Donavan Foil M.D.   On: 06/04/2016 15:59   Dg Abd Portable 1 View  Result Date: 05/08/2016 CLINICAL DATA:  OG tube placement  EXAM: PORTABLE ABDOMEN - 1 VIEW COMPARISON:  None. FINDINGS: Orogastric tube with the tip projecting over the antrum of the stomach. There is no bowel dilatation to suggest obstruction. There is no evidence of pneumoperitoneum, portal venous gas or pneumatosis. There are no pathologic calcifications along the expected course of the ureters. The osseous structures are unremarkable. IMPRESSION: Orogastric tube with the tip projecting over the antrum of the stomach. Electronically Signed   By: Kathreen Devoid   On: 06/01/2016 15:59     Medications:   . ceFEPime (MAXIPIME) IV Stopped (05/31/16 0317)  . fentaNYL 300 mcg/hr (05/31/16 0600)  . magnesium sulfate 1 - 4  g bolus IVPB 2 g (05/31/16 1030)  . metronidazole Stopped (05/31/16 0930)  . norepinephrine (LEVOPHED) Adult infusion 38 mcg/min (05/31/16 1043)  . pureflow 3 each (05/31/16 9242)  .  sodium bicarbonate  infusion 1000 mL 50 mL/hr at 05/31/16 0818  . sodium chloride Stopped (05/30/16 1212)  . vancomycin Stopped (05/30/16 2230)  . vasopressin (PITRESSIN) infusion - *FOR SHOCK* 0.03 Units/min (05/31/16 0600)   . budesonide (PULMICORT) nebulizer solution  0.25 mg Nebulization Q6H  . chlorhexidine gluconate (MEDLINE KIT)  15 mL Mouth Rinse BID  . doxycycline  100 mg Oral Q12H  . feeding supplement (VITAL HIGH PROTEIN)  1,000 mL Per Tube Q24H  . heparin subcutaneous  5,000 Units Subcutaneous Q8H  . insulin aspart  1-3 Units Subcutaneous Q4H  . insulin glargine  10 Units Subcutaneous Q24H  . ipratropium-albuterol  3 mL Nebulization Q6H  . mouth rinse  15 mL Mouth Rinse 10 times per day  . pantoprazole (PROTONIX) IV  40 mg Intravenous Q24H  . sodium chloride flush  10-40 mL Intracatheter Q12H   bisacodyl, fentaNYL, heparin, midazolam, [DISCONTINUED] ondansetron **OR** ondansetron (ZOFRAN) IV, sennosides, sodium chloride flush  Assessment/ Plan:  Laura Kline is a 61 y.o. white female with diabetes mellitus type II insulin  dependent, hypertension, coronary artery disease, hyperlipidemia, peripheral vascular disease, atrial fibrillationwho was admitted [facility]  1. Acute renal failure with metabolic acidosis: anuric. Requiring vasopressors (norepinephrine and vasopressin).  Secondary to ATN from sepsis, hypotension, shock.  - Continue CVVHD 4K bath Therapy rate 2000, BFR 350 UF rate 142m/hr  - Discontinue sodium bicarbonate.   2. Hyponatremia: with hyperglycemia. Improved.   3. Hypocalcemia: with hyperphosphatemia - IV albumin   4. Sepsis/shock: requiring vasopressors - Vancomycin, cefepime, metronidazole, and doxycycline. Appreciate ID input.   Discussed case with Dr. SAlva Garnet   LOS: 2 Laura Kline 5/24/201810:56 AM

## 2016-05-31 NOTE — Progress Notes (Signed)
[employer] - [place] at [facility]   PATIENT NAME: Laura Kline    MR#:  [id number]  DATE OF BIRTH:  November 30, 1955  SUBJECTIVE:   Patient here due to septic shock with multiorgan failure. Remains critically ill and now weaned to just one vasopressor.  FiO2 has been decreased and remains Anuric on CRRT.  Follows simple commands on sedation.    REVIEW OF SYSTEMS:    Review of Systems  Unable to perform ROS: Intubated    Nutrition: NPO Tolerating Diet: No   DRUG ALLERGIES:   Allergies  Allergen Reactions  . Rofecoxib Other (See Comments)    Reaction: unknown  . Penicillins Rash and Other (See Comments)    Has patient had a PCN reaction causing immediate rash, facial/tongue/throat swelling, SOB or lightheadedness with hypotension: Unknown Has patient had a PCN reaction causing severe rash involving mucus membranes or skin necrosis: Unknown Has patient had a PCN reaction that required hospitalization: Unknown Has patient had a PCN reaction occurring within the last 10 years: Unknown If all of the above answers are "NO", then may proceed with Cephalosporin use.     VITALS:  Blood pressure (!) 91/43, pulse (!) 103, temperature 98 F (36.7 C), temperature source Oral, resp. rate 16, height 5' (1.524 m), weight (!) 143.1 kg (315 lb 7.7 oz), SpO2 100 %.  PHYSICAL EXAMINATION:   Physical Exam  GENERAL:  61 y.o.-year-old obese patient lying in bed critically ill appearing.  EYES: Pupils equal, round, reactive to light. No scleral icterus.  HEENT: Head atraumatic, normocephalic. Oropharynx and nasopharynx clear. ET and OG tube in place.  NECK:  Supple, no jugular venous distention. No thyroid enlargement, no tenderness.  LUNGS: Normal breath sounds bilaterally, no wheezing, rales, rhonchi. No use of accessory muscles of respiration.  CARDIOVASCULAR: S1, S2 normal. No murmurs, rubs, or gallops.  ABDOMEN: Soft, nontender, nondistended. Bowel sounds present. No  organomegaly or mass.  EXTREMITIES: No cyanosis, clubbing, +1-2 edema b/l.    NEUROLOGIC: sedated & Intubated but follows simple commands.  PSYCHIATRIC: Sedated and intubated.  SKIN: No obvious rash, lesion, LLE ulcer with no acute drainage.  Ulcer located in between the first & 2nd Toes.     LABORATORY PANEL:   CBC  Recent Labs Lab 05/31/16 0541  WBC 9.3  HGB 11.9*  HCT 34.6*  PLT 151   ------------------------------------------------------------------------------------------------------------------  Chemistries   Recent Labs Lab 05/31/16 0541 05/31/16 1117  NA 135 137  K 3.0* 2.9*  CL 99* 102  CO2 25 25  GLUCOSE 248* 218*  BUN 57* 49*  CREATININE 2.55* 2.23*  CALCIUM 7.4* 7.5*  MG 1.6* 1.9  AST 39  --   ALT 26  --   ALKPHOS 82  --   BILITOT 2.9*  --    ------------------------------------------------------------------------------------------------------------------  Cardiac Enzymes  Recent Labs Lab 05/19/2016 1503  TROPONINI 0.04*   ------------------------------------------------------------------------------------------------------------------  RADIOLOGY:  Ct Abdomen Pelvis Wo Contrast  Result Date: 05/09/2016 CLINICAL DATA:  Unresponsive.  Septic shock. EXAM: CT ABDOMEN AND PELVIS WITHOUT CONTRAST TECHNIQUE: Multidetector CT imaging of the abdomen and pelvis was performed following the standard protocol without IV contrast. COMPARISON:  Right upper quadrant and pelvic ultrasounds 05/26/2016 FINDINGS: Lower chest: Partially visualized right lower lobe consolidation with more patchy opacities elsewhere in both lower lobes more inferiorly. Likely trace bilateral pleural effusions, with evaluation limited by streak artifact. Partially visualize coronary artery atherosclerosis. Hepatobiliary: No focal liver abnormality is seen. Tiny stones layer in the gallbladder without evidence  of wall thickening or pericholecystic inflammatory change. No biliary dilatation.  Pancreas: Mild diffuse atrophy. Punctate calcifications at the level of the inferior pancreatic head which may be parenchymal (such as from past pancreatitis) or vascular. No pancreatic or biliary ductal dilatation to strongly suggest that these are ductal calculi. No peripancreatic inflammation. Spleen: Unremarkable. Adrenals/Urinary Tract: Unremarkable adrenal glands. No evidence of renal mass, calculi, or hydronephrosis. Bladder decompressed by a Foley catheter. Stomach/Bowel: Enteric tube terminates in the distal stomach. A moderate amount of fluid is present in the stomach. Fluid is present in scattered loops of nondilated small and large bowel including in the rectum. There is no evidence of bowel obstruction, and no gross bowel wall thickening is identified. The appendix is unremarkable. Vascular/Lymphatic: Abdominal aortic atherosclerosis without aneurysm. No enlarged lymph nodes. Reproductive: Small calcification in the uterine fundus, otherwise unremarkable appearance of the uterus and ovaries. Other: No intraperitoneal free fluid. Small fat containing umbilical hernia. Abdominal wall scarring in the midline inferior to the umbilicus. Musculoskeletal: Advanced thoracic and lumbar spine disc degeneration and advanced lumbar facet arthropathy. IMPRESSION: 1. Bibasilar pulmonary airspace opacities including partially visualized right lower lobe consolidation which may reflect pneumonia. 2. No definite acute abnormality identified in the abdomen or pelvis. 3. Cholelithiasis. 4. Other incidental findings as above. 5.  Aortic Atherosclerosis (ICD10-I70.0). Electronically Signed   By: Sebastian Ache M.D.   On: 06-20-2016 18:03   Dg Chest 1 View  Result Date: 06/20/2016 CLINICAL DATA:  Central line EXAM: CHEST 1 VIEW COMPARISON:  06/20/16 FINDINGS: Repositioned endotracheal tube, the tip is approximately 4.2 cm superior to the carina. Esophageal tube tip is below the diaphragm. Right IJ central venous catheter  tip faintly visualized over the proximal right atrium. No pneumothorax. Cardiomegaly with mild central congestion. Patchy perihilar and basilar opacity. IMPRESSION: 1. Repositioning of endotracheal tube 2. Right IJ central venous catheter tip overlies proximal right atrium, no pneumothorax 3. Low lung volumes. Slight increased patchy perihilar opacity compared to prior which may reflect increasing atelectasis or vascular congestion. Electronically Signed   By: Jasmine Pang M.D.   On: 06/20/16 17:09   Ct Head Wo Contrast  Result Date: 20-Jun-2016 CLINICAL DATA:  Unresponsive EXAM: CT HEAD WITHOUT CONTRAST TECHNIQUE: Contiguous axial images were obtained from the base of the skull through the vertex without intravenous contrast. COMPARISON:  None. FINDINGS: Brain: No acute territorial infarction, hemorrhage or intracranial mass is seen. Prominent extra-axial CSF spaces anteriorly likely related to atrophy. Ventricles are nonenlarged. Vascular: No hyperdense vessels. Carotid artery calcification and vertebral artery calcification. Skull: No fracture or suspicious bone lesion Sinuses/Orbits: Mucosal thickening in the maxillary and ethmoid sinuses. No acute orbital abnormality. Bilateral lens extraction. Other: None IMPRESSION: No CT evidence for acute intracranial abnormality. Electronically Signed   By: Jasmine Pang M.D.   On: Jun 20, 2016 17:52   US Renal  Result Date: 05/30/2016 CLINICAL DATA:  Acute renal insufficiency. EXAM: RENAL / URINARY TRACT ULTRASOUND COMPLETE COMPARISON:  CT scan 20-Jun-2016 FINDINGS: Right Kidney: Length: 10.8 cm. Mild renal cortical thinning but normal echogenicity. No focal lesions, renal calculi or hydronephrosis. Left Kidney: Length: 13.3 cm. Normal renal cortical thickness and echogenicity. No focal lesions, renal calculi or hydronephrosis. Bladder: Decompressed by a Foley catheter. Other: Cholelithiasis. IMPRESSION: Mild renal cortical thinning of the right kidney but normal  echogenicity. No hydronephrosis. Electronically Signed   By: Rudie Meyer M.D.   On: 05/30/2016 10:40   Dg Chest Port 1 View  Result Date: 05/31/2016 CLINICAL DATA:  Respiratory  failure. EXAM: PORTABLE CHEST 1 VIEW COMPARISON:  05/30/2016. FINDINGS: Endotracheal tube, NG tube, right IJ line, left subclavian line in stable position. Heart size stable. Previously identified linear lucency over the right chest no longer identified. Low lung volumes with basilar atelectasis. IMPRESSION: 1. Lines and tubes in stable position.  No pneumothorax. 2. Low lung volumes with basilar atelectasis. Chest is stable from prior exam. Electronically Signed   By: Maisie Fus  Register   On: 05/31/2016 06:38   Dg Chest Port 1 View  Result Date: 05/30/2016 CLINICAL DATA:  61 year old female undergoing right IJ catheter exchange EXAM: PORTABLE CHEST 1 VIEW COMPARISON:  Chest x-ray obtained earlier today at 16:11 p.m. FINDINGS: The right IJ central venous catheter has been exchanged for a non tunneled hemodialysis catheter. The catheter tip projects over the superior cavoatrial junction. Stable position of left subclavian approach central venous catheter with the tip also at the superior cavoatrial junction. The patient remains intubated. The tip of the endotracheal tube is 2.5 cm above the carina. A gastric tube is present, the tip lies off the field of view below the diaphragm presumably within the stomach. Stable cardiac and mediastinal contours. A linear vertically oriented lucency in the right mid to upper lung remains present and unchanged. This is of indeterminate clinical significance. Respiratory volumes remain very low and there is bibasilar atelectasis. No overt pulmonary edema. No acute osseous abnormality. IMPRESSION: 1. The right IJ central venous catheter has been exchanged for a non tunneled hemodialysis catheter the tip of which projects over the superior cavoatrial junction. 2. Other support apparatus in stable and  satisfactory position. 3. No significant interval change in the appearance of the lungs with persistent very low volumes and bibasilar atelectasis. Electronically Signed   By: Malachy Moan M.D.   On: 05/30/2016 17:18   Dg Chest Port 1 View  Result Date: 05/30/2016 CLINICAL DATA:  Status post central line placement EXAM: PORTABLE CHEST 1 VIEW COMPARISON:  May 31, 2016 FINDINGS: Cardiac shadow is stable. Endotracheal tube and nasogastric catheter are again seen and stable. Right jugular central line is again seen and stable. A new left subclavian central line is noted with the catheter tip in the proximal superior vena cava. No pneumothorax is noted. No bony abnormality is seen. IMPRESSION: Status post left central venous line without pneumothorax. The remainder the exam is stable from the prior study. Electronically Signed   By: Alcide Clever M.D.   On: 05/30/2016 16:27   Dg Chest Port 1 View  Result Date: 05/31/2016 CLINICAL DATA:  ETT placement EXAM: PORTABLE CHEST 1 VIEW COMPARISON:  05/15/2016 FINDINGS: Endotracheal tube tip encroaches right mainstem bronchus orifice. Esophageal tube tip is below the diaphragm but not well visualized. Low lung volumes with subsegmental atelectasis at both bases. Borderline to mild cardiomegaly. No edema or infiltrate. IMPRESSION: Endotracheal tube tip encroaches the right mainstem bronchus orifice. Low lung volumes with subsegmental atelectasis at the bases. Electronically Signed   By: Jasmine Pang M.D.   On: 05-31-2016 15:59   Dg Abd Portable 1 View  Result Date: 05-31-2016 CLINICAL DATA:  OG tube placement EXAM: PORTABLE ABDOMEN - 1 VIEW COMPARISON:  None. FINDINGS: Orogastric tube with the tip projecting over the antrum of the stomach. There is no bowel dilatation to suggest obstruction. There is no evidence of pneumoperitoneum, portal venous gas or pneumatosis. There are no pathologic calcifications along the expected course of the ureters. The osseous  structures are unremarkable. IMPRESSION: Orogastric tube with the tip projecting over the  antrum of the stomach. Electronically Signed   By: Elige KoHetal  Patel   On: 2016/12/14 15:59     ASSESSMENT AND PLAN:   61 year old female with past medical history of peripheral vascular disease, morbid obesity, insulin-dependent diabetes, hypertension, hyperlipidemia, essential hypertension, recent admission for left lower extremity cellulitis and acute kidney injury of presents to the hospital due to altered mental status and noted to be in septic shock.  1. Sepsis with septic shock-source unclear but suspected to be some due to left lower extremity ulcer/cellulitis. -Seen by infectious disease and continue IV vancomycin, cefepime and Flagyl and doxycycline added. Follow cultures.  2. Septic shock-hypotension has improved and weaned off one of the vasopressors. Continue Levophed. -continue aggressive IV fluid hydration, vasopressors. Continue empiric antibiotics as mentioned above. Follow hemodynamics.  3. Acute respiratory failure-patient intubated due to obtundation and altered mental status and unable to maintain her airway. -Continue vent support, now on 30% FiO2 and wean as per pulmonary/intensivist. Follow serial ABGs.  4. Oliguric renal failure-secondary to sepsis and septic shock. - seen by nephrology, cont. CRRT as pt. Remains Anuric.  - off bicarbonate drip now  5. Metabolic acidosis-secondary to sepsis and also renal failure. - cont. CRRT and supportive care as mentioned above.  Follow serial ABG's.   6. Diabetes type 2 without complication- BS labile and cont. Insulin gtt.   7. Altered mental status-metabolic encephalopathy secondary to sepsis, septic shock and multiorgan failure. Follow mental status once patient is extubated and her sepsis has improved.  Follows simple commands.   8. Hypokalemia - cont. ICU electrolyte replacement protocol and monitor.   Patient is critically ill with  multiorgan failure. Prognosis is poor.  All the records are reviewed and case discussed with Care Management/Social Worker. Management plans discussed with the patient, family and they are in agreement.  CODE STATUS: Full  DVT Prophylaxis: Hep. SQ  TOTAL TIME TAKING CARE OF THIS PATIENT: 30 minutes.   POSSIBLE D/C unclear, DEPENDING ON CLINICAL CONDITION and progress.   Houston SirenSAINANI,Rajiv Parlato J M.D on 05/31/2016 at 2:09 PM  Between 7am to 6pm - Pager - [phone]  After 6pm go to [email or web address] - Social research officer, governmentpassword EPAS [facility]  [facility] [place] [facility]  Office  [phone]  CC: Primary care physician; Patient, No Pcp Per

## 2016-05-31 NOTE — Progress Notes (Signed)
RN spoke with Dr. Wynelle LinkKolluru and made him aware that patient's most recent potassium is 3.0 and on 2 K+ bath. MD gave order to change dialysis fluid to 4 K+ bath.

## 2016-05-31 NOTE — Progress Notes (Signed)
Nutrition Follow-up  DOCUMENTATION CODES:   Morbid obesity  INTERVENTION:  1. Trickle feeds per MD/NP - VHP started @ 4920mL/hr while patient receiving 2240mcg/min of Levo  NUTRITION DIAGNOSIS:   Inadequate oral intake related to inability to eat as evidenced by NPO status. -ongoing  GOAL:   Provide needs based on ASPEN/SCCM guidelines -not meeting  MONITOR:   I & O's, Labs, Vent status, TF tolerance, Weight trends  REASON FOR ASSESSMENT:   Ventilator    ASSESSMENT:   Today patient was found to be short of breath and  unresponsive at the rehabilitation center. Patient was sent over to the emergency department. she was found to be bradycardiac, Atropine was given and was intubated in the ED  Patient is currently intubated on ventilator support MV: 11.9 L/min Temp (24hrs), Avg:98 F (36.7 C), Min:97.8 F (36.6 C), Max:98.4 F (36.9 C) Propofol: none On CRRT Discussed in Rounds Labs and medications reviewed: CBGs: 243, 232 K 3.0, Mg 1.6, Tbili 2.9 Fentanyl gtt, Levo gtt, Vaso gtt NaHCO3 w/ D5 @ 7150mL/hr --> 204 calories  Diet Order:     Skin:  Wound (see comment) (Pressure injury to sacrum)  Last BM:  05/28/2016  Height:   Ht Readings from Last 1 Encounters:  05/09/2016 5' (1.524 m)    Weight:   Wt Readings from Last 1 Encounters:  05/31/16 (!) 315 lb 7.7 oz (143.1 kg)    Ideal Body Weight:  45.45 kg  BMI:  Body mass index is 61.61 kg/m.  Estimated Nutritional Needs:   Kcal:  1200-1400 calories (26-30 cal/kg IBW)  Protein:  >/= 113 gm (2.5 gm/kg IBW)  Fluid:  Per MD/NP/PA  EDUCATION NEEDS:   Education needs no appropriate at this time  Dionne AnoWilliam M. Averi Kilty, MS, RD LDN Inpatient Clinical Dietitian Pager [phone]

## 2016-05-31 NOTE — Progress Notes (Signed)
Inpatient Diabetes Program Recommendations  AACE/ADA: New Consensus Statement on Inpatient Glycemic Control (2015)  Target Ranges:  Prepandial:   less than 140 mg/dL      Peak postprandial:   less than 180 mg/dL (1-2 hours)      Critically ill patients:  140 - 180 mg/dL   Lab Results  Component Value Date   GLUCAP 243 (H) 05/31/2016   HGBA1C 9.0 (H) 10-16-2016    Review of Glycemic Control  Results for Laura Kline, Laura Kline (MRN [id number]) as of 05/31/2016 10:16  Ref. Range 05/30/2016 20:39 05/30/2016 21:45 05/30/2016 23:53 05/31/2016 03:48 05/31/2016 07:26  Glucose-Capillary Latest Ref Range: 65 - 99 mg/dL 409135 (H) 811184 (H) 914213 (H) 232 (H) 243 (H)    Diabetes history: Type 2 Outpatient Diabetes medications: Lantus 50 units qhs Current orders for Inpatient glycemic control: Lantus 10 units qhs, Novolog 1-3 units q4h  Inpatient Diabetes Program Recommendations:   Please place the patient back on IV insulin- (per protocol/cosign required) Phase 2 ICU Glycemic Control order set since there are 2 subsequent CBG>200.  RN notified.  Susette RacerJulie Rozlyn Yerby, RN, BA, MHA, CDE Diabetes Coordinator Inpatient Diabetes Program  [phone] (Team Pager) [phone] [facility] Office) 05/31/2016 10:27 AM

## 2016-05-31 NOTE — Progress Notes (Signed)
PULMONARY / CRITICAL CARE MEDICINE   Name: Laura RootsSabrina [name] Schertzer MRN: [id number] DOB: 09/02/1955    ADMISSION DATE:  2016/10/16   CONSULTATION DATE:  02018/10/09  REFERRING MD:  Dr.Gouru  Reason: ICU management of septic shock and acute respiratory failure  CHIEF COMPLAINT:  Unresponsive  PT PROFILE: 1964 F with multiple severe medical problems [facility] and readmitted via ED with altered mental status and respiratory arrest. Clinical data and presentation consistent with severe sepsis/septic shock, unclear source  MAJOR EVENTS/TEST RESULTS: 05/22 admitted with severe sepsis/septic shock, intubated.  05/22 CTAP: Bibasilar pulmonary opacities in the visualized portion of the lower lobes. No intra-abdominal pathology noted. 05/22 CT head: No acute abnormalities 05/23 CRRT initiated for anuric renal failure 05/23 ID Service consultation: Antibiotics adjusted 05/23 Podiatry consultation: Left foot thought to be healing appropriately after recent surgery. No evidence of acute infection 05/23 renal US: No hydronephrosis 05/24 vasopressor requirement improving. Cognition intact. Patient wakens and follows commands appropriately (despite fentanyl at 300 mcg/h)  INDWELLING DEVICES:: ETT 05/23 >>  L [place] CVL 05/23 >>  R IJ HD cath 05/23 >>    MICRO DATA: MRSA PCR 05/22 >>  Urine 05/22 >>  Resp  05/22 >>  Blood 05/22 >>   ANTIMICROBIALS:  Vanc 05/22 >>  Cefepime 05/22 >>  Metronidazole 05/23 >>  Doxycycline 05/23 >>   SUBJECTIVE:  Opens eyes to voice. Follows commands consistently. No apparent distress. Supported on pressure control ventilation with inspiratory pressure of 10 cm H2O. Was actually tolerating pressure support ventilation at 5 cm H2O earlier in morning   VITAL SIGNS: BP (!) 150/132   Pulse (!) 41   Temp 98 F (36.7 C) (Oral)   Resp (!) 23   Ht 5' (1.524 m)   Wt (!) 315 lb 7.7 oz (143.1 kg)   SpO2 90%   BMI 61.61 kg/m    HEMODYNAMICS: CVP:  [6 mmHg-9 mmHg] 6 mmHg  VENTILATOR SETTINGS: Vent Mode: PCV FiO2 (%):  [30 %-40 %] 30 % Set Rate:  [12 bmp-15 bmp] 12 bmp Vt Set:  [450 mL] 450 mL PEEP:  [5 cmH20] 5 cmH20 Pressure Support:  [5 cmH20] 5 cmH20  INTAKE / OUTPUT: I/O last 3 completed shifts: In: 7030.8 [I.V.:6830.8; IV Piggyback:200] Out: 1604 [Urine:110; Emesis/NG output:900; Other:594]  PHYSICAL EXAMINATION: General:  Very obese, intubated, RASS -1, not F/C Neuro: PERRLA, EOMI, MAE's, DTRs symmetric HEENT: NCAT, sclerae mildly icteric Cardiovascular:  Mildly tachycardic, irregular, no murmurs noted Lungs: No wheezes, breath sounds diminished dependently Abdomen:  Profoundly obese, diminished bowel sounds, nondistended, no apparent tenderness Extremities: Cool, symmetric pedal edema, post operative changes on left foot, left foot dressed Skin: Intertriginous excoriations  LABS:  BMET  Recent Labs Lab 05/31/16 0541 05/31/16 1117 05/31/16 1440  NA 135 137 137  K 3.0* 2.9* 3.0*  CL 99* 102 102  CO2 25 25 26   BUN 57* 49* 46*  CREATININE 2.55* 2.23* 2.13*  GLUCOSE 248* 218* 171*    Electrolytes  Recent Labs Lab 05/31/16 0541 05/31/16 1117 05/31/16 1440  CALCIUM 7.4* 7.5* 8.1*  MG 1.6* 1.9 2.2  PHOS 4.8* 3.9 3.6    CBC  Recent Labs Lab Oct 26, 2016 1503 05/30/16 0529 05/31/16 0541  WBC 19.8* 10.3 9.3  HGB 14.4 13.4 11.9*  HCT 44.6 40.2 34.6*  PLT 438 455* 151    Coag's  Recent Labs Lab 05/26/16 0613 05/26/16 1427 Oct 26, 2016 2129 05/30/16 0529 05/31/16 0541  APTT 35 56*  --   --  53*  INR 1.52  --  2.53 2.55  --     Sepsis Markers  Recent Labs Lab 05/25/16 2252 05/25/2016 1503 05/28/2016 1900 06/02/2016 2050 05/30/16 0529 05/31/16 0541  LATICACIDVEN 1.6 4.8* 2.1*  --   --   --   PROCALCITON  --   --   --  2.26 4.24 5.03    ABG  Recent Labs Lab 05/12/2016 2050 05/30/16 0152 05/30/16 0431  PHART 7.00* 7.11* 7.21*  PCO2ART 38 44 47  PO2ART 98 80*  107    Liver Enzymes  Recent Labs Lab 06/01/2016 1503 05/30/16 0529  05/31/16 0541 05/31/16 1117 05/31/16 1440  AST 34 44*  --  39  --   --   ALT 30 29  --  26  --   --   ALKPHOS 89 81  --  82  --   --   BILITOT 3.0* 4.0*  --  2.9*  --   --   ALBUMIN 2.1* 1.8*  < > 1.9* 2.0* 2.0*  < > = values in this interval not displayed.  Cardiac Enzymes  Recent Labs Lab 05/12/2016 1503  TROPONINI 0.04*    Glucose  Recent Labs Lab 05/31/16 0726 05/31/16 1110 05/31/16 1207 05/31/16 1303 05/31/16 1407 05/31/16 1503  GLUCAP 243* 227* 202* 195* 179* 180*    CXR: Low lung volumes, no definite infiltrates    ASSESSMENT / PLAN:  PULMONARY A: Ventilator dependent respiratory failure P:   Cont vent support - settings reviewed and/or adjusted Cont vent bundle Daily SBT if/when meets criteria   CARDIOVASCULAR A:  Severe septic shock with lactic acidosis P:  MAP goal > 65 mmHg Vasopressin ordered Titrate norepinephrine to off as tolerated  RENAL A:   AKI, anuric CKD Metabolic acidosis P:   Monitor BMET intermittently Monitor I/Os Correct electrolytes as indicated Renal service following Continue CRRT Increase ultrafiltration rate  GASTROINTESTINAL A:   Morbid obesity Elevated bilirubin P:   SUP: IV PPI Initiate TF protocol Monitor LFTs intermittently  HEMATOLOGIC A:   No acute issues Precipitous drop in platelet count P:  DVT px: SQ heparin Monitor CBC intermittently Transfuse per usual guidelines If platelets continue to drop, will need to stop heparin  INFECTIOUS A:   Severe sepsis with unclear source PCT rising P:   Monitor temp, WBC count Micro and abx as above Continue to monitor PCT  ENDOCRINE A:   Severe hyperglycemia. History of type 2 diabetes P:   Continue insulin infusion  NEUROLOGIC A:   Acute metabolic encephalopathy, resolved ICU/ventilator associated discomfort P:   RASS goal: -1 to -2 Continue fentanyl infusion  and PRN midazolam   FAMILY: Husband updated in detail at bedside  CCM time: 40 mins The above time includes time spent in consultation with patient and/or family members and reviewing care plan on multidisciplinary rounds  Billy Fischer, MD PCCM service Mobile [phone] Pager [phone] 05/31/2016 4:17 PM

## 2016-05-31 NOTE — Progress Notes (Signed)
PHARMACY - CRITICAL CARE PROGRESS NOTE  Pharmacy Consult for Electrolytes  Indication: electrolyte management   Allergies  Allergen Reactions  . Rofecoxib Other (See Comments)    Reaction: unknown  . Penicillins Rash and Other (See Comments)    Has patient had a PCN reaction causing immediate rash, facial/tongue/throat swelling, SOB or lightheadedness with hypotension: Unknown Has patient had a PCN reaction causing severe rash involving mucus membranes or skin necrosis: Unknown Has patient had a PCN reaction that required hospitalization: Unknown Has patient had a PCN reaction occurring within the last 10 years: Unknown If all of the above answers are "NO", then may proceed with Cephalosporin use.     Patient Measurements: Height: 5' (152.4 cm) Weight: (!) 315 lb 7.7 oz (143.1 kg) IBW/kg (Calculated) : 45.5 Adjusted Body Weight:   Vital Signs: Temp: 98.2 F (36.8 C) (05/24 1951) Temp Source: Axillary (05/24 1951) BP: 92/77 (05/24 2130) Pulse Rate: 105 (05/24 2130) Intake/Output from previous day: 05/23 0701 - 05/24 0700 In: 4016.7 [I.V.:3816.7; IV Piggyback:200] Out: 1249 [Urine:55; Emesis/NG output:600] Intake/Output from this shift: Total I/O In: 284 [I.V.:64; NG/GT:20; IV Piggyback:200] Out: 101 [Urine:5; Other:96] Vent settings for last 24 hours: Vent Mode: PCV FiO2 (%):  [30 %-40 %] 30 % Set Rate:  [12 bmp-15 bmp] 12 bmp Vt Set:  [450 mL] 450 mL PEEP:  [5 cmH20] 5 cmH20 Pressure Support:  [5 cmH20] 5 cmH20  Labs:  Recent Labs  05/21/2016 1503  05/13/2016 2129 05/30/16 0529  05/31/16 0541 05/31/16 1117 05/31/16 1440 05/31/16 1830  WBC 19.8*  --   --  10.3  --  9.3  --   --   --   HGB 14.4  --   --  13.4  --  11.9*  --   --   --   HCT 44.6  --   --  40.2  --  34.6*  --   --   --   PLT 438  --   --  455*  --  151  --   --   --   APTT  --   --   --   --   --  53*  --   --   --   INR  --   --  2.53 2.55  --   --   --   --   --   CREATININE 3.42*  < >  --   3.07*  < > 2.55* 2.23* 2.13* 2.09*  MG  --   < >  --  1.6*  < > 1.6* 1.9 2.2 1.9  PHOS  --   < >  --  7.5*  < > 4.8* 3.9 3.6 3.5  ALBUMIN 2.1*  --   --  1.8*  < > 1.9* 2.0* 2.0* 2.0*  PROT 5.3*  --   --  4.5*  --  4.5*  --   --   --   AST 34  --   --  44*  --  39  --   --   --   ALT 30  --   --  29  --  26  --   --   --   ALKPHOS 89  --   --  81  --  82  --   --   --   BILITOT 3.0*  --   --  4.0*  --  2.9*  --   --   --   < > =  values in this interval not displayed. Estimated Creatinine Clearance: 37.2 mL/min (A) (by C-G formula based on SCr of 2.09 mg/dL (H)).   Recent Labs  05/31/16 1820 05/31/16 1914 05/31/16 1953  GLUCAP 129* 129* 125*    Microbiology: Recent Results (from the past 720 hour(s))  Blood Culture (routine x 2)     Status: None   Collection Time: 05/12/16 11:57 AM  Result Value Ref Range Status   Specimen Description BLOOD RIGHT ASSIST CONTROL  Final   Special Requests   Final    BOTTLES DRAWN AEROBIC AND ANAEROBIC Blood Culture adequate volume   Culture NO GROWTH 5 DAYS  Final   Report Status 05/17/2016 FINAL  Final  Blood Culture (routine x 2)     Status: None   Collection Time: 05/12/16 11:57 AM  Result Value Ref Range Status   Specimen Description BLOOD LEFT ARM  Final   Special Requests   Final    BOTTLES DRAWN AEROBIC AND ANAEROBIC Blood Culture results may not be optimal due to an excessive volume of blood received in culture bottles   Culture NO GROWTH 5 DAYS  Final   Report Status 05/17/2016 FINAL  Final  Aerobic/Anaerobic Culture (surgical/deep wound)     Status: None   Collection Time: 05/13/16  8:38 AM  Result Value Ref Range Status   Specimen Description WOUND LEFT FOOT  Final   Special Requests NONE  Final   Gram Stain   Final    MODERATE WBC PRESENT, PREDOMINANTLY PMN ABUNDANT GRAM POSITIVE COCCI IN PAIRS IN CLUSTERS Performed at [facility], [address], [place], [place] [address]    Culture   Final    MODERATE STAPHYLOCOCCUS  AUREUS ABUNDANT PEPTOSTREPTOCOCCUS SPECIES    Report Status 05/20/2016 FINAL  Final   Organism ID, Bacteria STAPHYLOCOCCUS AUREUS  Final      Susceptibility   Staphylococcus aureus - MIC*    CIPROFLOXACIN <=0.5 SENSITIVE Sensitive     ERYTHROMYCIN >=8 RESISTANT Resistant     GENTAMICIN <=0.5 SENSITIVE Sensitive     OXACILLIN <=0.25 SENSITIVE Sensitive     TETRACYCLINE <=1 SENSITIVE Sensitive     VANCOMYCIN <=0.5 SENSITIVE Sensitive     TRIMETH/SULFA <=10 SENSITIVE Sensitive     CLINDAMYCIN <=0.25 SENSITIVE Sensitive     RIFAMPIN <=0.5 SENSITIVE Sensitive     Inducible Clindamycin NEGATIVE Sensitive     * MODERATE STAPHYLOCOCCUS AUREUS  Urine culture     Status: None   Collection Time: 05/13/16  5:39 PM  Result Value Ref Range Status   Specimen Description URINE, CLEAN CATCH  Final   Special Requests NONE  Final   Culture   Final    NO GROWTH Performed at [facility], [address], [place], [place] [address]    Report Status 05/15/2016 FINAL  Final  MRSA PCR Screening     Status: None   Collection Time: 05/26/16  6:40 AM  Result Value Ref Range Status   MRSA by PCR NEGATIVE NEGATIVE Final    Comment:        The GeneXpert MRSA Assay (FDA approved for NASAL specimens only), is one component of a comprehensive MRSA colonization surveillance program. It is not intended to diagnose MRSA infection nor to guide or monitor treatment for MRSA infections.   C difficile quick scan w PCR reflex     Status: None   Collection Time: 05/26/16  1:30 PM  Result Value Ref Range Status   C Diff  antigen NEGATIVE NEGATIVE Final   C Diff toxin NEGATIVE NEGATIVE Final   C Diff interpretation No C. difficile detected.  Final  Gastrointestinal Panel by PCR , Stool     Status: None   Collection Time: 05/26/16  6:19 PM  Result Value Ref Range Status   Campylobacter species NOT DETECTED NOT DETECTED Final   Plesimonas shigelloides NOT DETECTED NOT DETECTED Final   Salmonella species  NOT DETECTED NOT DETECTED Final   Yersinia enterocolitica NOT DETECTED NOT DETECTED Final   Vibrio species NOT DETECTED NOT DETECTED Final   Vibrio cholerae NOT DETECTED NOT DETECTED Final   Enteroaggregative E coli (EAEC) NOT DETECTED NOT DETECTED Final   Enteropathogenic E coli (EPEC) NOT DETECTED NOT DETECTED Final   Enterotoxigenic E coli (ETEC) NOT DETECTED NOT DETECTED Final   Shiga like toxin producing E coli (STEC) NOT DETECTED NOT DETECTED Final   Shigella/Enteroinvasive E coli (EIEC) NOT DETECTED NOT DETECTED Final   Cryptosporidium NOT DETECTED NOT DETECTED Final   Cyclospora cayetanensis NOT DETECTED NOT DETECTED Final   Entamoeba histolytica NOT DETECTED NOT DETECTED Final   Giardia lamblia NOT DETECTED NOT DETECTED Final   Adenovirus F40/41 NOT DETECTED NOT DETECTED Final   Astrovirus NOT DETECTED NOT DETECTED Final   Norovirus GI/GII NOT DETECTED NOT DETECTED Final   Rotavirus A NOT DETECTED NOT DETECTED Final   Sapovirus (I, II, IV, and V) NOT DETECTED NOT DETECTED Final  Blood Culture (routine x 2)     Status: None (Preliminary result)   Collection Time: 06/05/2016  3:03 PM  Result Value Ref Range Status   Specimen Description BLOOD L AC  Final   Special Requests BOTTLES DRAWN AEROBIC AND ANAEROBIC BCAV  Final   Culture NO GROWTH 2 DAYS  Final   Report Status PENDING  Incomplete  Blood Culture (routine x 2)     Status: None (Preliminary result)   Collection Time: 06/05/2016  4:42 PM  Result Value Ref Range Status   Specimen Description BLOOD RIGHT NECK  Final   Special Requests   Final    BOTTLES DRAWN AEROBIC AND ANAEROBIC Blood Culture results may not be optimal due to an excessive volume of blood received in culture bottles   Culture NO GROWTH 2 DAYS  Final   Report Status PENDING  Incomplete  MRSA PCR Screening     Status: None   Collection Time: 06/05/2016  8:35 PM  Result Value Ref Range Status   MRSA by PCR NEGATIVE NEGATIVE Final    Comment:        The  GeneXpert MRSA Assay (FDA approved for NASAL specimens only), is one component of a comprehensive MRSA colonization surveillance program. It is not intended to diagnose MRSA infection nor to guide or monitor treatment for MRSA infections.   Urine culture     Status: Abnormal   Collection Time: 05/30/16  1:51 AM  Result Value Ref Range Status   Specimen Description URINE, RANDOM  Final   Special Requests NONE  Final   Culture >=100,000 COLONIES/mL YEAST (A)  Final   Report Status 05/31/2016 FINAL  Final  Culture, respiratory (NON-Expectorated)     Status: None (Preliminary result)   Collection Time: 05/30/16  2:45 AM  Result Value Ref Range Status   Specimen Description TRACHEAL ASPIRATE  Final   Special Requests NONE  Final   Gram Stain   Final    FEW WBC PRESENT, PREDOMINANTLY MONONUCLEAR MODERATE YEAST FEW GRAM POSITIVE COCCI RARE GRAM VARIABLE ROD  Culture   Final    CULTURE REINCUBATED FOR BETTER GROWTH Performed at [facility], [address] [place], [place] [address]    Report Status PENDING  Incomplete    Medications:  Prescriptions Prior to Admission  Medication Sig Dispense Refill Last Dose  . apixaban (ELIQUIS) 5 MG TABS tablet Take 1 tablet (5 mg total) by mouth 2 (two) times daily. 60 tablet 0 06-26-2016 at am  . clindamycin (CLEOCIN) 300 MG capsule Take 1 capsule (300 mg total) by mouth 4 (four) times daily. 24 capsule 0 26-Jun-2016 at am  . digoxin (LANOXIN) 0.25 MG tablet Take 1 tablet (0.25 mg total) by mouth daily.   2016-06-26 at am  . diltiazem (CARDIZEM CD) 180 MG 24 hr capsule Take 1 capsule (180 mg total) by mouth daily.   06-26-2016 at am  . furosemide (LASIX) 40 MG tablet Take 1 tablet (40 mg total) by mouth daily. 30 tablet  06/26/2016 at am  . HYDROcodone-acetaminophen (NORCO) 7.5-325 MG tablet Take 1 tablet by mouth every 6 (six) hours as needed for moderate pain. 30 tablet 0 prn at prn  . Insulin Glargine (LANTUS SOLOSTAR) 100 UNIT/ML  Solostar Pen Inject 50 Units into the skin at bedtime. 15 mL 11 05/28/2016 at qhs  . levofloxacin (LEVAQUIN) 500 MG tablet Take 1 tablet (500 mg total) by mouth daily. 6 tablet 0 Jun 26, 2016 at am  . loperamide (IMODIUM A-D) 2 MG tablet Take 2 mg by mouth every 8 (eight) hours as needed for diarrhea or loose stools.   prn at prn  . metoprolol (LOPRESSOR) 50 MG tablet Take 1 tablet (50 mg total) by mouth 2 (two) times daily. 180 tablet 3 06/26/2016 at am  . norethindrone (AYGESTIN) 5 MG tablet Take 1 tablet (5 mg total) by mouth 3 (three) times daily. 30 tablet 0 26-Jun-2016 at am  . nystatin (MYCOSTATIN/NYSTOP) powder Apply 1 g topically as needed (for rash and redness).   prn at prn  . ondansetron (ZOFRAN) 4 MG tablet Take 1 tablet (4 mg total) by mouth every 6 (six) hours as needed for nausea. 20 tablet 0 prn at prn  . rosuvastatin (CRESTOR) 10 MG tablet Take 1 tablet (10 mg total) by mouth daily after supper. 90 tablet 3 05/28/2016 at pm  . senna-docusate (SENOKOT-S) 8.6-50 MG tablet Take 1 tablet by mouth 2 (two) times daily.   06-26-2016 at am    Assessment: 5/24: K @ 18:00 = 3.3   Goal of Therapy:  Normalization of electrolytes   Plan:  KCl 10 mEq IV X 3 ordered to be given around 5/24 @ 22:00. Will recheck electrolytes on 5/25 @ 2:00.   Laura Kline D 05/31/2016,9:50 PM

## 2016-05-31 NOTE — Progress Notes (Signed)
Discussed PLT 450s >> 150s with Dr. Nicholos Johnsamachandran with recent h/o heparin drip last admission- Dr. Nicholos Johnsamachandran wants to continue [facility] [employer] for now, hold off on checking HIT panel, and f/u AM CBC. Patient is on CRRT which is known to decrease platelet count.   Luisa HartScott Danay Mckellar, PharmD Clinical Pharmacist

## 2016-06-01 ENCOUNTER — Inpatient Hospital Stay: Payer: BLUE CROSS/BLUE SHIELD

## 2016-06-01 LAB — HEPATIC FUNCTION PANEL
ALT: 29 U/L (ref 14–54)
AST: 47 U/L — ABNORMAL HIGH (ref 15–41)
Albumin: 1.9 g/dL — ABNORMAL LOW (ref 3.5–5.0)
Alkaline Phosphatase: 83 U/L (ref 38–126)
BILIRUBIN DIRECT: 1.7 mg/dL — AB (ref 0.1–0.5)
BILIRUBIN INDIRECT: 1.2 mg/dL — AB (ref 0.3–0.9)
Total Bilirubin: 2.9 mg/dL — ABNORMAL HIGH (ref 0.3–1.2)
Total Protein: 4.4 g/dL — ABNORMAL LOW (ref 6.5–8.1)

## 2016-06-01 LAB — MAGNESIUM
MAGNESIUM: 1.8 mg/dL (ref 1.7–2.4)
MAGNESIUM: 1.8 mg/dL (ref 1.7–2.4)
Magnesium: 1.9 mg/dL (ref 1.7–2.4)
Magnesium: 1.7 mg/dL (ref 1.7–2.4)
Magnesium: 1.8 mg/dL (ref 1.7–2.4)
Magnesium: 1.9 mg/dL (ref 1.7–2.4)

## 2016-06-01 LAB — RENAL FUNCTION PANEL
ALBUMIN: 1.8 g/dL — AB (ref 3.5–5.0)
ALBUMIN: 1.6 g/dL — AB (ref 3.5–5.0)
ALBUMIN: 1.8 g/dL — AB (ref 3.5–5.0)
ANION GAP: 9 (ref 5–15)
ANION GAP: 7 (ref 5–15)
Albumin: 2 g/dL — ABNORMAL LOW (ref 3.5–5.0)
Albumin: 1.7 g/dL — ABNORMAL LOW (ref 3.5–5.0)
Albumin: 1.4 g/dL — ABNORMAL LOW (ref 3.5–5.0)
Anion gap: 6 (ref 5–15)
Anion gap: 8 (ref 5–15)
Anion gap: 7 (ref 5–15)
Anion gap: 8 (ref 5–15)
BUN: 36 mg/dL — ABNORMAL HIGH (ref 6–20)
BUN: 35 mg/dL — AB (ref 6–20)
BUN: 33 mg/dL — ABNORMAL HIGH (ref 6–20)
BUN: 32 mg/dL — ABNORMAL HIGH (ref 6–20)
BUN: 31 mg/dL — ABNORMAL HIGH (ref 6–20)
BUN: 32 mg/dL — AB (ref 6–20)
CALCIUM: 8.3 mg/dL — AB (ref 8.9–10.3)
CALCIUM: 8.3 mg/dL — AB (ref 8.9–10.3)
CHLORIDE: 104 mmol/L (ref 101–111)
CO2: 25 mmol/L (ref 22–32)
CO2: 25 mmol/L (ref 22–32)
CO2: 24 mmol/L (ref 22–32)
CO2: 23 mmol/L (ref 22–32)
CO2: 25 mmol/L (ref 22–32)
CO2: 25 mmol/L (ref 22–32)
CREATININE: 1.79 mg/dL — AB (ref 0.44–1.00)
CREATININE: 1.58 mg/dL — AB (ref 0.44–1.00)
CREATININE: 1.46 mg/dL — AB (ref 0.44–1.00)
Calcium: 8 mg/dL — ABNORMAL LOW (ref 8.9–10.3)
Calcium: 8 mg/dL — ABNORMAL LOW (ref 8.9–10.3)
Calcium: 8.2 mg/dL — ABNORMAL LOW (ref 8.9–10.3)
Calcium: 8.5 mg/dL — ABNORMAL LOW (ref 8.9–10.3)
Chloride: 103 mmol/L (ref 101–111)
Chloride: 106 mmol/L (ref 101–111)
Chloride: 103 mmol/L (ref 101–111)
Chloride: 103 mmol/L (ref 101–111)
Chloride: 105 mmol/L (ref 101–111)
Creatinine, Ser: 1.5 mg/dL — ABNORMAL HIGH (ref 0.44–1.00)
Creatinine, Ser: 1.65 mg/dL — ABNORMAL HIGH (ref 0.44–1.00)
Creatinine, Ser: 1.54 mg/dL — ABNORMAL HIGH (ref 0.44–1.00)
GFR calc Af Amer: 39 mL/min — ABNORMAL LOW (ref >60)
GFR calc Af Amer: 42 mL/min — ABNORMAL LOW
GFR calc Af Amer: 37 mL/min — ABNORMAL LOW (ref >60)
GFR calc Af Amer: 43 mL/min — ABNORMAL LOW (ref >60)
GFR calc non Af Amer: 36 mL/min — ABNORMAL LOW
GFR calc non Af Amer: 32 mL/min — ABNORMAL LOW (ref >60)
GFR calc non Af Amer: 35 mL/min — ABNORMAL LOW (ref >60)
GFR, EST AFRICAN AMERICAN: 34 mL/min — AB (ref >60)
GFR, EST AFRICAN AMERICAN: 41 mL/min — AB (ref >60)
GFR, EST NON AFRICAN AMERICAN: 29 mL/min — AB (ref >60)
GFR, EST NON AFRICAN AMERICAN: 34 mL/min — AB (ref >60)
GFR, EST NON AFRICAN AMERICAN: 37 mL/min — AB (ref >60)
GLUCOSE: 165 mg/dL — AB (ref 65–99)
GLUCOSE: 155 mg/dL — AB (ref 65–99)
Glucose, Bld: 161 mg/dL — ABNORMAL HIGH (ref 65–99)
Glucose, Bld: 158 mg/dL — ABNORMAL HIGH (ref 65–99)
Glucose, Bld: 155 mg/dL — ABNORMAL HIGH (ref 65–99)
Glucose, Bld: 147 mg/dL — ABNORMAL HIGH (ref 65–99)
PHOSPHORUS: 3 mg/dL (ref 2.5–4.6)
PHOSPHORUS: 2.8 mg/dL (ref 2.5–4.6)
PHOSPHORUS: 3 mg/dL (ref 2.5–4.6)
POTASSIUM: 3.6 mmol/L (ref 3.5–5.1)
POTASSIUM: 3.7 mmol/L (ref 3.5–5.1)
Phosphorus: 3.2 mg/dL (ref 2.5–4.6)
Phosphorus: 3 mg/dL (ref 2.5–4.6)
Phosphorus: 3 mg/dL (ref 2.5–4.6)
Potassium: 3.6 mmol/L (ref 3.5–5.1)
Potassium: 3.5 mmol/L (ref 3.5–5.1)
Potassium: 3.4 mmol/L — ABNORMAL LOW (ref 3.5–5.1)
Potassium: 3.8 mmol/L (ref 3.5–5.1)
SODIUM: 136 mmol/L (ref 135–145)
SODIUM: 135 mmol/L (ref 135–145)
Sodium: 135 mmol/L (ref 135–145)
Sodium: 137 mmol/L (ref 135–145)
Sodium: 135 mmol/L (ref 135–145)
Sodium: 138 mmol/L (ref 135–145)

## 2016-06-01 LAB — URINALYSIS, COMPLETE (UACMP) WITH MICROSCOPIC
Glucose, UA: NEGATIVE mg/dL
Ketones, ur: 5 mg/dL — AB
Nitrite: NEGATIVE
Protein, ur: 100 mg/dL — AB
Specific Gravity, Urine: 1.026 (ref 1.005–1.030)
Squamous Epithelial / HPF: NONE SEEN
pH: 5 (ref 5.0–8.0)

## 2016-06-01 LAB — GLUCOSE, CAPILLARY
GLUCOSE-CAPILLARY: 137 mg/dL — AB (ref 65–99)
GLUCOSE-CAPILLARY: 146 mg/dL — AB (ref 65–99)
GLUCOSE-CAPILLARY: 147 mg/dL — AB (ref 65–99)
Glucose-Capillary: 150 mg/dL — ABNORMAL HIGH (ref 65–99)
Glucose-Capillary: 151 mg/dL — ABNORMAL HIGH (ref 65–99)
Glucose-Capillary: 137 mg/dL — ABNORMAL HIGH (ref 65–99)

## 2016-06-01 LAB — HEPATITIS PANEL, ACUTE
Hep A IgM: NEGATIVE
Hep B C IgM: NEGATIVE
Hepatitis B Surface Ag: NEGATIVE

## 2016-06-01 LAB — CBC
HCT: 32.4 % — ABNORMAL LOW (ref 35.0–47.0)
Hemoglobin: 11.3 g/dL — ABNORMAL LOW (ref 12.0–16.0)
MCH: 32.1 pg (ref 26.0–34.0)
MCHC: 34.8 g/dL (ref 32.0–36.0)
MCV: 92.3 fL (ref 80.0–100.0)
Platelets: 110 10*3/uL — ABNORMAL LOW (ref 150–440)
RBC: 3.51 MIL/uL — AB (ref 3.80–5.20)
RDW: 13.7 % (ref 11.5–14.5)
WBC: 4.8 10*3/uL (ref 3.6–11.0)

## 2016-06-01 LAB — C DIFFICILE QUICK SCREEN W PCR REFLEX
C Diff antigen: NEGATIVE
C Diff interpretation: NOT DETECTED
C Diff toxin: NEGATIVE

## 2016-06-01 LAB — BASIC METABOLIC PANEL
ANION GAP: 5 (ref 5–15)
BUN: 36 mg/dL — ABNORMAL HIGH (ref 6–20)
CALCIUM: 8.4 mg/dL — AB (ref 8.9–10.3)
CHLORIDE: 104 mmol/L (ref 101–111)
CO2: 26 mmol/L (ref 22–32)
CREATININE: 1.75 mg/dL — AB (ref 0.44–1.00)
GFR calc non Af Amer: 30 mL/min — ABNORMAL LOW (ref >60)
GFR, EST AFRICAN AMERICAN: 35 mL/min — AB (ref >60)
Glucose, Bld: 160 mg/dL — ABNORMAL HIGH (ref 65–99)
Potassium: 3.6 mmol/L (ref 3.5–5.1)
SODIUM: 135 mmol/L (ref 135–145)

## 2016-06-01 LAB — BASIC METABOLIC PANEL WITH GFR
Anion gap: 8 (ref 5–15)
BUN: 34 mg/dL — ABNORMAL HIGH (ref 6–20)
CO2: 25 mmol/L (ref 22–32)
Calcium: 8.1 mg/dL — ABNORMAL LOW (ref 8.9–10.3)
Chloride: 102 mmol/L (ref 101–111)
Creatinine, Ser: 1.54 mg/dL — ABNORMAL HIGH (ref 0.44–1.00)
GFR calc Af Amer: 41 mL/min — ABNORMAL LOW
GFR calc non Af Amer: 35 mL/min — ABNORMAL LOW
Glucose, Bld: 164 mg/dL — ABNORMAL HIGH (ref 65–99)
Potassium: 3.4 mmol/L — ABNORMAL LOW (ref 3.5–5.1)
Sodium: 135 mmol/L (ref 135–145)

## 2016-06-01 LAB — PREALBUMIN: Prealbumin: <5 mg/dL — ABNORMAL LOW (ref 18–38)

## 2016-06-01 LAB — APTT: aPTT: 70 seconds — ABNORMAL HIGH (ref 24–36)

## 2016-06-01 LAB — PROCALCITONIN: PROCALCITONIN: 4.57 ng/mL

## 2016-06-01 LAB — PHOSPHORUS: Phosphorus: 3.2 mg/dL (ref 2.5–4.6)

## 2016-06-01 LAB — VANCOMYCIN, RANDOM: Vancomycin Rm: 10

## 2016-06-01 MED ORDER — FLUCONAZOLE IN SODIUM CHLORIDE 400-0.9 MG/200ML-% IV SOLN
400.0000 mg | INTRAVENOUS | Status: DC
  Administered 2016-06-01 – 2016-06-04 (×4): 400 mg via INTRAVENOUS
  Filled 2016-06-01 (×5): qty 200

## 2016-06-01 MED ORDER — POTASSIUM CHLORIDE 20 MEQ/15ML (10%) PO SOLN
20.0000 meq | Freq: Once | ORAL | Status: AC
  Administered 2016-06-01: 20 meq
  Filled 2016-06-01: qty 15

## 2016-06-01 MED ORDER — VANCOMYCIN HCL IN DEXTROSE 1-5 GM/200ML-% IV SOLN
1000.0000 mg | Freq: Two times a day (BID) | INTRAVENOUS | Status: DC
  Filled 2016-06-01: qty 200

## 2016-06-01 MED ORDER — VITAL HIGH PROTEIN PO LIQD
1000.0000 mL | ORAL | Status: DC
  Administered 2016-06-01 (×2): 1000 mL

## 2016-06-01 MED ORDER — ANTICOAGULANT SODIUM CITRATE 4% (200MG/5ML) IV SOLN
5.0000 mL | Status: DC | PRN
  Administered 2016-06-05: 1.3 mL via INTRAVENOUS
  Filled 2016-06-01: qty 250

## 2016-06-01 MED ORDER — VITAL HIGH PROTEIN PO LIQD: 1000.0000 mL | ORAL | Status: DC

## 2016-06-01 MED ORDER — SODIUM CHLORIDE 0.9 % IV BOLUS (SEPSIS)
1000.0000 mL | Freq: Once | INTRAVENOUS | Status: AC
  Administered 2016-06-01: 1000 mL via INTRAVENOUS

## 2016-06-01 NOTE — Progress Notes (Signed)
CRRT cartridge changed per machine requirement. Patient tolerating CRRT at this time. Will resume hourly charting at 1500. Will continue to monitor patient.

## 2016-06-01 NOTE — Progress Notes (Signed)
Pharmacy Antibiotic Note/CRRT Medication Adjusment  Laura Kline is a 61 y.o. female with a recent I&D of left foot abscess admitted on 05/19/2016 with sepsis.  Pharmacy has been consulted for vancomycin and cefepime dosing. Patient is beginning CRRT for acute renal failure. Patient is also ordered doxycycline and metronidazole per ID.   Plan: 1. Will continue vancomycin 1000 mg iv q 24 hours and plan on checking a level 5/25 at 2030. Goal vancomycin level= 15-25 mcg/ml.   Will continue cefepime 2 g iv q 12 hours.   2. Aside from antibiotics, no medications require adjustment for CRRT at present.   5/25:  VT @ 20:10 = 10 mcg/mL  Will adjust dose to vancomycin 1 gm IV Q12H to start on 5/26 @ 0800.   Will recheck VT before 3rd new dose on 5/27 @ 0730.   Height: 5' (152.4 cm) Weight: (!) 319 lb 7.1 oz (144.9 kg) IBW/kg (Calculated) : 45.5  Temp (24hrs), Avg:98.1 F (36.7 C), Min:97.5 F (36.4 C), Max:98.6 F (37 C)   Recent Labs Lab 05/25/16 2252 05/27/16 0643  05/19/2016 1503 05/10/2016 1900  05/30/16 0529  05/31/16 0541  06/01/16 0256 06/01/16 0622 06/01/16 1023 06/01/16 1210 06/01/16 1630 06/01/16 2010  WBC 3.6 2.9*  --  19.8*  --   --  10.3  --  9.3  --  4.8  --   --   --   --   --   CREATININE 2.67* 1.55*  < > 3.42*  --   < > 3.07*  < > 2.55*  < > 1.75*  1.79* 1.54*  1.58* 1.50* 1.65* 1.54* 1.46*  LATICACIDVEN 1.6  --   --  4.8* 2.1*  --   --   --   --   --   --   --   --   --   --   --   VANCORANDOM  --   --   --   --   --   --   --   --   --   --   --   --   --   --   --  10  < > = values in this interval not displayed.  Estimated Creatinine Clearance: 53.8 mL/min (A) (by C-G formula based on SCr of 1.46 mg/dL (H)).    Allergies  Allergen Reactions  . Rofecoxib Other (See Comments)    Reaction: unknown  . Penicillins Rash and Other (See Comments)    Has patient had a PCN reaction causing immediate rash, facial/tongue/throat swelling, SOB or lightheadedness  with hypotension: Unknown Has patient had a PCN reaction causing severe rash involving mucus membranes or skin necrosis: Unknown Has patient had a PCN reaction that required hospitalization: Unknown Has patient had a PCN reaction occurring within the last 10 years: Unknown If all of the above answers are "NO", then may proceed with Cephalosporin use.     Antimicrobials this admission: vanc 5/22 >>  levofloxacin 5/22 >> 5/23 Cefepime 5/22 >> Metronidazole 5/23 >> Doxycycline >>  Dose adjustments this admission:  Microbiology results: BCx: NGTD TA: abundant yeast, rare mold UCx: yeast MRSA PCR: negative C diff: sent GI PCR: sent  Thank you for allowing pharmacy to be a part of this patient's care.  Dia Jefferys D Clinical Pharmacist  06/01/2016 10:02 PM

## 2016-06-01 NOTE — Progress Notes (Signed)
[facility]  ROUNDING NOTE   Subjective:   CRRT 4K bath Norepinephrine gtt Vasopressin gtt  UF 2041 net +698  Urine culture with yeast  Objective:  Vital signs in last 24 hours:  Temp:  [97.7 F (36.5 C)-98.6 F (37 C)] 97.7 F (36.5 C) (05/25 0800) Pulse Rate:  [41-145] 108 (05/25 0830) Resp:  [12-27] 21 (05/25 0830) BP: (69-150)/(25-132) 99/54 (05/25 0830) SpO2:  [90 %-100 %] 100 % (05/25 0830) FiO2 (%):  [30 %] 30 % (05/25 0800) Weight:  [144.9 kg (319 lb 7.1 oz)] 144.9 kg (319 lb 7.1 oz) (05/25 0500)  Weight change: 1.8 kg (3 lb 15.5 oz) Filed Weights   05/30/16 0500 05/31/16 0500 06/01/16 0500  Weight: 135.5 kg (298 lb 11.6 oz) (!) 143.1 kg (315 lb 7.7 oz) (!) 144.9 kg (319 lb 7.1 oz)    Intake/Output: I/O last 3 completed shifts: In: 4461.8 [I.V.:3391.8; Other:30; NG/GT:440; IV HOZYYQMGN:003] Out: 7048 [Urine:85; Emesis/NG output:300; GQBVQ:9450]   Intake/Output this shift:  Total I/O In: 360.5 [I.V.:118.5; NG/GT:42; IV Piggyback:200] Out: 102 [Urine:5; Other:97]  Physical Exam: General: Critically ill  Head: ETT NGT  Eyes: Anicteric, PERRL  Neck: RIJ temp HD catheter, left subclavian  Lungs:  Diminished bilaterally, Pressure support FiO2 30%  Heart: irregular  Abdomen:  Soft, nontender, obese  Extremities:  + peripheral edema.  Neurologic: Intubated, sedated  Skin: Left foot incision - clean, dry and intact  Access: RIJ temp HD catheter Dr. Alva Garnet 3/88    Basic Metabolic Panel:  Recent Labs Lab 05/31/16 1440 05/31/16 1830 05/31/16 2227 06/01/16 0256 06/01/16 0622  NA 137 135 134* 135  135 135  136  K 3.0* 3.1* 3.6 3.6  3.6 3.4*  3.5  CL 102 100* 104 104  104 102  103  CO2 26 26 25 26  25 25  25   GLUCOSE 171* 143* 164* 160*  161* 164*  165*  BUN 46* 43* 39* 36*  36* 34*  35*  CREATININE 2.13* 2.09* 1.96* 1.75*  1.79* 1.54*  1.58*  CALCIUM 8.1* 8.1* 8.2* 8.4*  8.3* 8.1*  8.0*  MG 2.2 1.9 1.8 1.9 1.8  PHOS  3.6 3.5 3.3 3.2  3.2 3.0    Liver Function Tests:  Recent Labs Lab 05/27/16 0643 05/16/2016 1503 05/30/16 0529  05/31/16 0541  05/31/16 1440 05/31/16 1830 05/31/16 2227 06/01/16 0256 06/01/16 0622  AST 29 34 44*  --  39  --   --   --   --  47*  --   ALT 89* 30 29  --  26  --   --   --   --  29  --   ALKPHOS 121 89 81  --  82  --   --   --   --  83  --   BILITOT 1.4* 3.0* 4.0*  --  2.9*  --   --   --   --  2.9*  --   PROT 4.7* 5.3* 4.5*  --  4.5*  --   --   --   --  4.4*  --   ALBUMIN 1.9* 2.1* 1.8*  < > 1.9*  < > 2.0* 2.0* 2.0* 1.9*  2.0* 1.8*  < > = values in this interval not displayed.  Recent Labs Lab 06/02/2016 1503 05/28/2016 2050  LIPASE 16  --   AMYLASE  --  71   No results for input(s): AMMONIA in the last 168 hours.  CBC:  Recent  Labs Lab 05/25/16 2252 05/27/16 6256 06/03/2016 1503 05/30/16 0529 05/31/16 0541 06/01/16 0256  WBC 3.6 2.9* 19.8* 10.3 9.3 4.8  NEUTROABS 2.1  --  14.0*  --   --   --   HGB 12.0 11.7* 14.4 13.4 11.9* 11.3*  HCT 35.1 34.9* 44.6 40.2 34.6* 32.4*  MCV 92.8 92.9 95.5 92.9 92.5 92.3  PLT 101* 109* 438 455* 151 110*    Cardiac Enzymes:  Recent Labs Lab 05/18/2016 1503  TROPONINI 0.04*    BNP: Invalid input(s): POCBNP  CBG:  Recent Labs Lab 05/31/16 1914 05/31/16 1953 05/31/16 2330 06/01/16 0332 06/01/16 0736  GLUCAP 129* 125* 136* 150* 147*    Microbiology: Results for orders placed or performed during the hospital encounter of 06/03/2016  Blood Culture (routine x 2)     Status: None (Preliminary result)   Collection Time: 05/18/2016  3:03 PM  Result Value Ref Range Status   Specimen Description BLOOD L AC  Final   Special Requests BOTTLES DRAWN AEROBIC AND ANAEROBIC BCAV  Final   Culture NO GROWTH 2 DAYS  Final   Report Status PENDING  Incomplete  Blood Culture (routine x 2)     Status: None (Preliminary result)   Collection Time: 05/14/2016  4:42 PM  Result Value Ref Range Status   Specimen Description BLOOD  RIGHT NECK  Final   Special Requests   Final    BOTTLES DRAWN AEROBIC AND ANAEROBIC Blood Culture results may not be optimal due to an excessive volume of blood received in culture bottles   Culture NO GROWTH 2 DAYS  Final   Report Status PENDING  Incomplete  MRSA PCR Screening     Status: None   Collection Time: 05/19/2016  8:35 PM  Result Value Ref Range Status   MRSA by PCR NEGATIVE NEGATIVE Final    Comment:        The GeneXpert MRSA Assay (FDA approved for NASAL specimens only), is one component of a comprehensive MRSA colonization surveillance program. It is not intended to diagnose MRSA infection nor to guide or monitor treatment for MRSA infections.   Urine culture     Status: Abnormal   Collection Time: 05/30/16  1:51 AM  Result Value Ref Range Status   Specimen Description URINE, RANDOM  Final   Special Requests NONE  Final   Culture >=100,000 COLONIES/mL YEAST (A)  Final   Report Status 05/31/2016 FINAL  Final  Culture, respiratory (NON-Expectorated)     Status: None (Preliminary result)   Collection Time: 05/30/16  2:45 AM  Result Value Ref Range Status   Specimen Description TRACHEAL ASPIRATE  Final   Special Requests NONE  Final   Gram Stain   Final    FEW WBC PRESENT, PREDOMINANTLY MONONUCLEAR MODERATE YEAST FEW GRAM POSITIVE COCCI RARE GRAM VARIABLE ROD    Culture   Final    CULTURE REINCUBATED FOR BETTER GROWTH Performed at [facility], [place] [address] [place], [place] [address]    Report Status PENDING  Incomplete    Coagulation Studies:  Recent Labs  05/09/2016 2129 05/30/16 0529  LABPROT 27.7* 27.9*  INR 2.53 2.55    Urinalysis:  Recent Labs  05/10/2016 1700  COLORURINE AMBER*  LABSPEC 1.017  PHURINE 5.0  GLUCOSEU 50*  HGBUR MODERATE*  BILIRUBINUR NEGATIVE  KETONESUR NEGATIVE  PROTEINUR 30*  NITRITE NEGATIVE  LEUKOCYTESUR NEGATIVE      Imaging: US Renal  Result Date: 05/30/2016 CLINICAL DATA:  Acute renal insufficiency.  EXAM: RENAL / URINARY TRACT ULTRASOUND COMPLETE COMPARISON:  CT scan 05/27/2016 FINDINGS: Right Kidney: Length: 10.8 cm. Mild renal cortical thinning but normal echogenicity. No focal lesions, renal calculi or hydronephrosis. Left Kidney: Length: 13.3 cm. Normal renal cortical thickness and echogenicity. No focal lesions, renal calculi or hydronephrosis. Bladder: Decompressed by a Foley catheter. Other: Cholelithiasis. IMPRESSION: Mild renal cortical thinning of the right kidney but normal echogenicity. No hydronephrosis. Electronically Signed   By: Marijo Sanes M.D.   On: 05/30/2016 10:40   Dg Chest Port 1 View  Result Date: 06/01/2016 CLINICAL DATA:  Respiratory failure . EXAM: PORTABLE CHEST 1 VIEW COMPARISON:  05/31/2016 . FINDINGS: Endotracheal tube 1.5 cm above the lower portion of the carina. Proximal repositioning of approximately 2 cm suggested. NG tube and bilateral central lines in stable position. Heart size stable. Low lung volumes with mild basilar atelectasis. No pneumothorax . IMPRESSION: 1. Endotracheal tube 1.5 cm above the lower portion of the carina. Proximal repositioning of approximately 2 cm should be considered. NG tube and bilateral central lines in stable position. 2. Low lung volumes with mild bibasilar atelectasis. These results will be called to the ordering clinician or representative by the Radiologist Assistant, and communication documented in the PACS or zVision Dashboard. Electronically Signed   By: Marcello Moores  Register   On: 06/01/2016 06:58   Dg Chest Port 1 View  Result Date: 05/31/2016 CLINICAL DATA:  Respiratory failure. EXAM: PORTABLE CHEST 1 VIEW COMPARISON:  05/30/2016. FINDINGS: Endotracheal tube, NG tube, right IJ line, left subclavian line in stable position. Heart size stable. Previously identified linear lucency over the right chest no longer identified. Low lung volumes with basilar atelectasis. IMPRESSION: 1. Lines and tubes in stable position.  No pneumothorax.  2. Low lung volumes with basilar atelectasis. Chest is stable from prior exam. Electronically Signed   By: Marcello Moores  Register   On: 05/31/2016 06:38   Dg Chest Port 1 View  Result Date: 05/30/2016 CLINICAL DATA:  61 year old female undergoing right IJ catheter exchange EXAM: PORTABLE CHEST 1 VIEW COMPARISON:  Chest x-ray obtained earlier today at 16:11 p.m. FINDINGS: The right IJ central venous catheter has been exchanged for a non tunneled hemodialysis catheter. The catheter tip projects over the superior cavoatrial junction. Stable position of left subclavian approach central venous catheter with the tip also at the superior cavoatrial junction. The patient remains intubated. The tip of the endotracheal tube is 2.5 cm above the carina. A gastric tube is present, the tip lies off the field of view below the diaphragm presumably within the stomach. Stable cardiac and mediastinal contours. A linear vertically oriented lucency in the right mid to upper lung remains present and unchanged. This is of indeterminate clinical significance. Respiratory volumes remain very low and there is bibasilar atelectasis. No overt pulmonary edema. No acute osseous abnormality. IMPRESSION: 1. The right IJ central venous catheter has been exchanged for a non tunneled hemodialysis catheter the tip of which projects over the superior cavoatrial junction. 2. Other support apparatus in stable and satisfactory position. 3. No significant interval change in the appearance of the lungs with persistent very low volumes and bibasilar atelectasis. Electronically Signed   By: Jacqulynn Cadet M.D.   On: 05/30/2016 17:18   Dg Chest Port 1 View  Result Date: 05/30/2016 CLINICAL DATA:  Status post central line placement EXAM: PORTABLE CHEST 1 VIEW COMPARISON:  06/03/2016 FINDINGS: Cardiac shadow is stable. Endotracheal tube and nasogastric catheter are again seen and stable. Right jugular central line  is again seen and stable. A new left  subclavian central line is noted with the catheter tip in the proximal superior vena cava. No pneumothorax is noted. No bony abnormality is seen. IMPRESSION: Status post left central venous line without pneumothorax. The remainder the exam is stable from the prior study. Electronically Signed   By: Inez Catalina M.D.   On: 05/30/2016 16:27     Medications:   . ceFEPime (MAXIPIME) IV Stopped (06/01/16 0324)  . fentaNYL 80 mcg/hr (06/01/16 0806)  . metronidazole 500 mg (06/01/16 0821)  . norepinephrine (LEVOPHED) Adult infusion 25 mcg/min (06/01/16 0806)  . pureflow 3 each (06/01/16 9622)  . sodium chloride Stopped (05/30/16 1212)  . vancomycin Stopped (05/31/16 2122)  . vasopressin (PITRESSIN) infusion - *FOR SHOCK* 0.03 Units/min (05/31/16 2304)   . budesonide (PULMICORT) nebulizer solution  0.25 mg Nebulization Q6H  . chlorhexidine gluconate (MEDLINE KIT)  15 mL Mouth Rinse BID  . doxycycline  100 mg Oral Q12H  . feeding supplement (VITAL HIGH PROTEIN)  1,000 mL Per Tube Q24H  . heparin subcutaneous  5,000 Units Subcutaneous Q8H  . insulin aspart  0-20 Units Subcutaneous Q4H  . ipratropium-albuterol  3 mL Nebulization Q6H  . mouth rinse  15 mL Mouth Rinse 10 times per day  . pantoprazole sodium  40 mg Per Tube Q1200  . potassium chloride  20 mEq Per Tube Once  . sodium chloride flush  10-40 mL Intracatheter Q12H   bisacodyl, fentaNYL, heparin, midazolam, [DISCONTINUED] ondansetron **OR** ondansetron (ZOFRAN) IV, sennosides, sodium chloride flush  Assessment/ Plan:  Ms. Laura Kline is a 61 y.o. white female with diabetes mellitus type II insulin dependent, hypertension, coronary artery disease, hyperlipidemia, peripheral vascular disease, atrial fibrillationwho was [facility]  1. Acute renal failure with metabolic acidosis: anuric. Requiring vasopressors (norepinephrine and vasopressin). Baseline 1.1 on 05/18/16 Secondary to ATN from sepsis, hypotension,  shock.  - Continue CVVHD 4K bath Therapy rate 2000, BFR 350 UF rate 165m/hr   2. Sepsis/shock: requiring vasopressors - Vancomycin, cefepime, metronidazole, and doxycycline. Appreciate ID input.   Discussed case with Dr. CJefferson Fuel  LOS: [place]5/25/20188:38 AM

## 2016-06-01 NOTE — Progress Notes (Signed)
Pharmacy Antibiotic Note/CRRT Medication Adjusment  Laura Kline is a 61 y.o. female with a recent I&D of left foot abscess admitted on 05/14/2016 with sepsis.  Pharmacy has been consulted for vancomycin and cefepime dosing. Patient is beginning CRRT for acute renal failure. Patient is also ordered doxycycline and metronidazole per ID.   Plan: 1. Will continue vancomycin 1000 mg iv q 24 hours and plan on checking a level 5/25 at 2030. Goal vancomycin level= 15-25 mcg/ml.   Will continue cefepime 2 g iv q 12 hours.   2. Aside from antibiotics, no medications require adjustment for CRRT at present.   Height: 5' (152.4 cm) Weight: (!) 319 lb 7.1 oz (144.9 kg) IBW/kg (Calculated) : 45.5  Temp (24hrs), Avg:98.2 F (36.8 C), Min:97.7 F (36.5 C), Max:98.6 F (37 C)   Recent Labs Lab 05/25/16 2252 05/27/16 0643  05/25/2016 1503 06/05/2016 1900  05/30/16 0529  05/31/16 0541  05/31/16 1830 05/31/16 2227 06/01/16 0256 06/01/16 0622 06/01/16 1023  WBC 3.6 2.9*  --  19.8*  --   --  10.3  --  9.3  --   --   --  4.8  --   --   CREATININE 2.67* 1.55*  < > 3.42*  --   < > 3.07*  < > 2.55*  < > 2.09* 1.96* 1.75*  1.79* 1.54*  1.58* 1.50*  LATICACIDVEN 1.6  --   --  4.8* 2.1*  --   --   --   --   --   --   --   --   --   --   < > = values in this interval not displayed.  Estimated Creatinine Clearance: 52.4 mL/min (A) (by C-G formula based on SCr of 1.5 mg/dL (H)).    Allergies  Allergen Reactions  . Rofecoxib Other (See Comments)    Reaction: unknown  . Penicillins Rash and Other (See Comments)    Has patient had a PCN reaction causing immediate rash, facial/tongue/throat swelling, SOB or lightheadedness with hypotension: Unknown Has patient had a PCN reaction causing severe rash involving mucus membranes or skin necrosis: Unknown Has patient had a PCN reaction that required hospitalization: Unknown Has patient had a PCN reaction occurring within the last 10 years: Unknown If  all of the above answers are "NO", then may proceed with Cephalosporin use.     Antimicrobials this admission: vanc 5/22 >>  levofloxacin 5/22 >> 5/23 Cefepime 5/22 >> Metronidazole 5/23 >> Doxycycline >>  Dose adjustments this admission:  Microbiology results: BCx: NGTD TA: abundant yeast, rare mold UCx: yeast MRSA PCR: negative C diff: sent GI PCR: sent  Thank you for allowing pharmacy to be a part of this patient's care.  Luisa HartScott Maurilio Puryear, PharmD Clinical Pharmacist  06/01/2016 12:05 PM

## 2016-06-01 NOTE — Plan of Care (Signed)
Problem: Pain Managment: Goal: General experience of comfort will improve Outcome: Progressing Patient has been able to express that experience of pain is not present, assessed multiple times this shift and documented.  Problem: Skin Integrity: Goal: Risk for impaired skin integrity will decrease Outcome: Progressing Patient has been turned every two hours to prevent skin breakdown.  Problem: Fluid Volume: Goal: Ability to maintain a balanced intake and output will improve Outcome: Not Progressing Patient has had minimal urine output this shift. Requiring CRRT, tolerated CRRT this shift.  Problem: Nutrition: Goal: Adequate nutrition will be maintained Outcome: Progressing Patient has been able to tolerate an increase in continuous tube feedings this shift.  Problem: Respiratory: Goal: Ability to maintain a clear airway and adequate ventilation will improve Outcome: Progressing Patient was able to maintain adequate ventilation on 8/4 mode on ventilator and tolerated several hours this shift.

## 2016-06-01 NOTE — Progress Notes (Signed)
At beginning of shift per NP turned Insulin drip off and placed patient on every 4 hour sliding scale insulin coverage.  Patients heart rate increased to 150-160's approximately 0315. Notified NP Maggie and received verbal order to obtain a current CVP reading and infuse 1L fluid bolus if CVP less than 12. Obtained CVP reading of 4.  Infused 1L of Normal Saline. Heart rate has lowered into low 100's. Still in AFIB. Closely monitoring lung sounds, see flowsheets. Will continue to assess and monitor.

## 2016-06-01 NOTE — Progress Notes (Signed)
Pharmacy Antibiotic Note/CRRT Medication Adjusment  Laura Kline is a 61 y.o. female with a recent I&D of left foot abscess admitted on 05/16/2016 with sepsis.  Pharmacy has been consulted for fluconazole dosing for UTI. Patient is beginning CRRT for acute renal failure. Patient is currently on Vancomycin, Cefepime, Doxycycline, and Metronidazole.  Plan: 1. Will order Fluconazole 400mg  IV q24h.  Height: 5' (152.4 cm) Weight: (!) 319 lb 7.1 oz (144.9 kg) IBW/kg (Calculated) : 45.5  Temp (24hrs), Avg:98.1 F (36.7 C), Min:97.5 F (36.4 C), Max:98.6 F (37 C)   Recent Labs Lab 05/25/16 2252 05/27/16 0643  06/03/2016 1503 05/14/2016 1900  05/30/16 0529  05/31/16 0541  05/31/16 2227 06/01/16 0256 06/01/16 0622 06/01/16 1023 06/01/16 1210  WBC 3.6 2.9*  --  19.8*  --   --  10.3  --  9.3  --   --  4.8  --   --   --   CREATININE 2.67* 1.55*  < > 3.42*  --   < > 3.07*  < > 2.55*  < > 1.96* 1.75*  1.79* 1.54*  1.58* 1.50* 1.65*  LATICACIDVEN 1.6  --   --  4.8* 2.1*  --   --   --   --   --   --   --   --   --   --   < > = values in this interval not displayed.  Estimated Creatinine Clearance: 47.6 mL/min (A) (by C-G formula based on SCr of 1.65 mg/dL (H)).    Allergies  Allergen Reactions  . Rofecoxib Other (See Comments)    Reaction: unknown  . Penicillins Rash and Other (See Comments)    Has patient had a PCN reaction causing immediate rash, facial/tongue/throat swelling, SOB or lightheadedness with hypotension: Unknown Has patient had a PCN reaction causing severe rash involving mucus membranes or skin necrosis: Unknown Has patient had a PCN reaction that required hospitalization: Unknown Has patient had a PCN reaction occurring within the last 10 years: Unknown If all of the above answers are "NO", then may proceed with Cephalosporin use.     Antimicrobials this admission: vanc 5/22 >>  levofloxacin 5/22 >> 5/23 Cefepime 5/22 >> Metronidazole 5/23 >> Doxycycline  >> Fluconazole 5/25 >>  Dose adjustments this admission:  Microbiology results: BCx: NGTD TA: abundant yeast, rare mold UCx: yeast MRSA PCR: negative C diff: sent GI PCR: sent  Thank you for allowing pharmacy to be a part of this patient's care.  Clovia CuffLisa Janiel Crisostomo, PharmD, BCPS 06/01/2016 5:16 PM

## 2016-06-01 NOTE — Progress Notes (Signed)
Nutrition Follow-up  DOCUMENTATION CODES:   Morbid obesity  INTERVENTION:  1. Increase VHP to goal rate of 3155mL/hr  NUTRITION DIAGNOSIS:   Inadequate oral intake related to inability to eat as evidenced by NPO status. -ongoing  GOAL:   Provide needs based on ASPEN/SCCM guidelines -progressing  MONITOR:   I & O's, Labs, Vent status, TF tolerance, Weight trends  ASSESSMENT:   Today patient was found to be short of breath and  unresponsive at the rehabilitation center. Patient was sent over to the emergency department. she was found to be bradycardiac, Atropine was given and was intubated in the ED  Patient is currently intubated on ventilator support MV: 12.5 L/min Temp (24hrs), Avg:98.2 F (36.8 C), Min:97.7 F (36.5 C), Max:98.6 F (37 C) Propofol: None On CRRT d/t AKI Labs and medications reviewed: CBGs: WNL K 3.4, Tbili 2.9 Fentanyl gtt, Levo gtt, Vaso gtt  Diet Order:     Skin:  Wound (see comment) (Pressure injury to sacrum)  Last BM:  05/28/2016  Height:   Ht Readings from Last 1 Encounters:  17-Jun-2016 5' (1.524 m)    Weight:   Wt Readings from Last 1 Encounters:  06/01/16 (!) 319 lb 7.1 oz (144.9 kg)    Ideal Body Weight:  45.45 kg  BMI:  Body mass index is 62.39 kg/m.  Estimated Nutritional Needs:   Kcal:  1200-1400 calories (26-30 cal/kg IBW)  Protein:  >/= 113 gm (2.5 gm/kg IBW)  Fluid:  Per MD/NP/PA  EDUCATION NEEDS:   Education needs no appropriate at this time  Dionne AnoWilliam M. Braylee Lal, MS, RD LDN Inpatient Clinical Dietitian Pager [phone]

## 2016-06-01 NOTE — Progress Notes (Signed)
[facility] INFECTIOUS DISEASE PROGRESS NOTE Date of Admission:  05/17/2016     ID: Laura Kline is a 61 y.o. female with sepsis. Active Problems:   Septic shock (HCC)   Subjective: Still on pressors. Nurse reports thick urine from foley.  Diarrhea and has rectal tube. Nurse reports vaginal discharge.   ROS  Eleven systems are reviewed and negative except per hpi  Medications:  Antibiotics Given (last 72 hours)    Date/Time Action Medication Dose Rate   06/02/2016 1656 New Bag/Given   vancomycin (VANCOCIN) IVPB 1000 mg/200 mL premix 1,000 mg 200 mL/hr   05/27/2016 2121 New Bag/Given   vancomycin (VANCOCIN) IVPB 1000 mg/200 mL premix 1,000 mg 200 mL/hr   05/11/2016 2207 New Bag/Given   levofloxacin (LEVAQUIN) IVPB 750 mg 750 mg 100 mL/hr   05/30/16 1619 New Bag/Given   ceFEPIme (MAXIPIME) 2 g in dextrose 5 % 50 mL IVPB 2 g 100 mL/hr   05/30/16 1739 New Bag/Given   metroNIDAZOLE (FLAGYL) IVPB 500 mg 500 mg 100 mL/hr   05/30/16 2130 New Bag/Given   vancomycin (VANCOCIN) IVPB 1000 mg/200 mL premix 1,000 mg 200 mL/hr   05/30/16 2213 Given   doxycycline (VIBRA-TABS) tablet 100 mg 100 mg    05/31/16 0045 New Bag/Given   metroNIDAZOLE (FLAGYL) IVPB 500 mg 500 mg 100 mL/hr   05/31/16 0247 New Bag/Given   ceFEPIme (MAXIPIME) 2 g in dextrose 5 % 50 mL IVPB 2 g 100 mL/hr   05/31/16 0830 New Bag/Given   metroNIDAZOLE (FLAGYL) IVPB 500 mg 500 mg 100 mL/hr   05/31/16 1045 Given   doxycycline (VIBRA-TABS) tablet 100 mg 100 mg    05/31/16 1421 New Bag/Given   ceFEPIme (MAXIPIME) 2 g in dextrose 5 % 50 mL IVPB 2 g 100 mL/hr   05/31/16 1624 New Bag/Given   metroNIDAZOLE (FLAGYL) IVPB 500 mg 500 mg 100 mL/hr   05/31/16 2022 New Bag/Given   vancomycin (VANCOCIN) IVPB 1000 mg/200 mL premix 1,000 mg 200 mL/hr   05/31/16 2214 Given   doxycycline (VIBRA-TABS) tablet 100 mg 100 mg    06/01/16 0102 New Bag/Given   metroNIDAZOLE (FLAGYL) IVPB 500 mg 500 mg 100 mL/hr   06/01/16 0254 New  Bag/Given   ceFEPIme (MAXIPIME) 2 g in dextrose 5 % 50 mL IVPB 2 g 100 mL/hr   06/01/16 6734 New Bag/Given   metroNIDAZOLE (FLAGYL) IVPB 500 mg 500 mg 100 mL/hr   06/01/16 1009 Given   doxycycline (VIBRA-TABS) tablet 100 mg 100 mg    06/01/16 1409 New Bag/Given   ceFEPIme (MAXIPIME) 2 g in dextrose 5 % 50 mL IVPB 2 g 100 mL/hr     . budesonide (PULMICORT) nebulizer solution  0.25 mg Nebulization Q6H  . chlorhexidine gluconate (MEDLINE KIT)  15 mL Mouth Rinse BID  . doxycycline  100 mg Oral Q12H  . insulin aspart  0-20 Units Subcutaneous Q4H  . ipratropium-albuterol  3 mL Nebulization Q6H  . mouth rinse  15 mL Mouth Rinse 10 times per day  . pantoprazole sodium  40 mg Per Tube Q1200  . sodium chloride flush  10-40 mL Intracatheter Q12H    Objective: Vital signs in last 24 hours: Temp:  [97.5 F (36.4 C)-98.6 F (37 C)] 97.5 F (36.4 C) (05/25 1600) Pulse Rate:  [100-145] 104 (05/25 1600) Resp:  [15-27] 22 (05/25 1600) BP: (69-134)/(21-102) 89/51 (05/25 1600) SpO2:  [97 %-100 %] 100 % (05/25 1600) FiO2 (%):  [30 %] 30 % (05/25 1600)  Weight:  [144.9 kg (319 lb 7.1 oz)] 144.9 kg (319 lb 7.1 oz) (05/25 0500) Constitutional:  moridly obese, intubated sedated. R neck IJ line wnl  HENT: [place]/AT, PERRLA, no scleral icterus Mouth/Throat: ETT in place Cardiovascular: Normal rate, regular rhythm and normal heart sounds.tachy Pulmonary/Chest: mechanical BS, rhonci hil  Neck = supple, no nuchal rigidity Abdominal: Soft. Obese, large pannus  nds are normal.  exhibits no distension. There is no tenderness.  Lymphadenopathy: no cervical adenopathy. No axillary adenopathy Neurological:sedated Ext L foot with inciison site from recent I and D with no edema, erythema nor drainage  Skin: + brusing  Psychiatric: intubated   Lab Results  Recent Labs  05/31/16 0541  06/01/16 0256  06/01/16 1023 06/01/16 1210  WBC 9.3  --  4.8  --   --   --   HGB 11.9*  --  11.3*  --   --   --   HCT  34.6*  --  32.4*  --   --   --   NA 135  < > 135  135  < > 137 135  K 3.0*  < > 3.6  3.6  < > 3.4* 3.6  CL 99*  < > 104  104  < > 106 103  CO2 25  < > 26  25  < > 24 23  BUN 57*  < > 36*  36*  < > 33* 32*  CREATININE 2.55*  < > 1.75*  1.79*  < > 1.50* 1.65*  < > = values in this interval not displayed.  Microbiology: Results for orders placed or performed during the hospital encounter of 05/16/2016  Blood Culture (routine x 2)     Status: None (Preliminary result)   Collection Time: 05/28/2016  3:03 PM  Result Value Ref Range Status   Specimen Description BLOOD L AC  Final   Special Requests BOTTLES DRAWN AEROBIC AND ANAEROBIC BCAV  Final   Culture NO GROWTH 3 DAYS  Final   Report Status PENDING  Incomplete  Blood Culture (routine x 2)     Status: None (Preliminary result)   Collection Time: 05/15/2016  4:42 PM  Result Value Ref Range Status   Specimen Description BLOOD RIGHT NECK  Final   Special Requests   Final    BOTTLES DRAWN AEROBIC AND ANAEROBIC Blood Culture results may not be optimal due to an excessive volume of blood received in culture bottles   Culture NO GROWTH 3 DAYS  Final   Report Status PENDING  Incomplete  MRSA PCR Screening     Status: None   Collection Time: 06/02/2016  8:35 PM  Result Value Ref Range Status   MRSA by PCR NEGATIVE NEGATIVE Final    Comment:        The GeneXpert MRSA Assay (FDA approved for NASAL specimens only), is one component of a comprehensive MRSA colonization surveillance program. It is not intended to diagnose MRSA infection nor to guide or monitor treatment for MRSA infections.   Urine culture     Status: Abnormal   Collection Time: 05/30/16  1:51 AM  Result Value Ref Range Status   Specimen Description URINE, RANDOM  Final   Special Requests NONE  Final   Culture >=100,000 COLONIES/mL YEAST (A)  Final   Report Status 05/31/2016 FINAL  Final  Culture, respiratory (NON-Expectorated)     Status: None (Preliminary result)    Collection Time: 05/30/16  2:45 AM  Result Value Ref Range Status  Specimen Description TRACHEAL ASPIRATE  Final   Special Requests NONE  Final   Gram Stain   Final    FEW WBC PRESENT, PREDOMINANTLY MONONUCLEAR MODERATE YEAST FEW GRAM POSITIVE COCCI RARE GRAM VARIABLE ROD    Culture   Final    ABUNDANT YEAST RARE FUNGUS (MOLD) ISOLATED, PROBABLE CONTAMINANT/COLONIZER (SAPROPHYTE). CONTACT MICROBIOLOGY IF FURTHER IDENTIFICATION REQUIRED [phone]. CULTURE REINCUBATED FOR BETTER GROWTH Performed at [facility], [place] [address], [place], [place] [address]    Report Status PENDING  Incomplete  C difficile quick scan w PCR reflex     Status: None   Collection Time: 06/01/16 12:18 PM  Result Value Ref Range Status   C Diff antigen NEGATIVE NEGATIVE Final   C Diff toxin NEGATIVE NEGATIVE Final   C Diff interpretation No C. difficile detected.  Final    Studies/Results: Dg Chest Port 1 View  Result Date: 06/01/2016 CLINICAL DATA:  Respiratory failure . EXAM: PORTABLE CHEST 1 VIEW COMPARISON:  05/31/2016 . FINDINGS: Endotracheal tube 1.5 cm above the lower portion of the carina. Proximal repositioning of approximately 2 cm suggested. NG tube and bilateral central lines in stable position. Heart size stable. Low lung volumes with mild basilar atelectasis. No pneumothorax . IMPRESSION: 1. Endotracheal tube 1.5 cm above the lower portion of the carina. Proximal repositioning of approximately 2 cm should be considered. NG tube and bilateral central lines in stable position. 2. Low lung volumes with mild bibasilar atelectasis. These results will be called to the ordering clinician or representative by the Radiologist Assistant, and communication documented in the PACS or zVision Dashboard. Electronically Signed   By: Marcello Moores  Register   On: 06/01/2016 06:58   Dg Chest Port 1 View  Result Date: 05/31/2016 CLINICAL DATA:  Respiratory failure. EXAM: PORTABLE CHEST 1 VIEW COMPARISON:   05/30/2016. FINDINGS: Endotracheal tube, NG tube, right IJ line, left subclavian line in stable position. Heart size stable. Previously identified linear lucency over the right chest no longer identified. Low lung volumes with basilar atelectasis. IMPRESSION: 1. Lines and tubes in stable position.  No pneumothorax. 2. Low lung volumes with basilar atelectasis. Chest is stable from prior exam. Electronically Signed   By: Marcello Moores  Register   On: 05/31/2016 06:38   Dg Chest Port 1 View  Result Date: 05/30/2016 CLINICAL DATA:  61 year old female undergoing right IJ catheter exchange EXAM: PORTABLE CHEST 1 VIEW COMPARISON:  Chest x-ray obtained earlier today at 16:11 p.m. FINDINGS: The right IJ central venous catheter has been exchanged for a non tunneled hemodialysis catheter. The catheter tip projects over the superior cavoatrial junction. Stable position of left subclavian approach central venous catheter with the tip also at the superior cavoatrial junction. The patient remains intubated. The tip of the endotracheal tube is 2.5 cm above the carina. A gastric tube is present, the tip lies off the field of view below the diaphragm presumably within the stomach. Stable cardiac and mediastinal contours. A linear vertically oriented lucency in the right mid to upper lung remains present and unchanged. This is of indeterminate clinical significance. Respiratory volumes remain very low and there is bibasilar atelectasis. No overt pulmonary edema. No acute osseous abnormality. IMPRESSION: 1. The right IJ central venous catheter has been exchanged for a non tunneled hemodialysis catheter the tip of which projects over the superior cavoatrial junction. 2. Other support apparatus in stable and satisfactory position. 3. No significant interval change in the appearance of the lungs with persistent very low volumes and bibasilar atelectasis.  Electronically Signed   By: Jacqulynn Cadet M.D.   On: 05/30/2016 17:18     Assessment/Plan: Laura Kline is a 61 y.o. female admitted with unresponsive, increasing SOB and found hypotensive, wbc elevated, no on pressors, intubated. Recently had I and d of R foot abscess with MSSA on cx but the foot appears relatively uninfected at this time. ALso recent vaginal bleeding, enlarged endometrial stripe on USS and elevated AST ALT. Currently ALT AST Nml but T bili elevated. Has thrombocytopenia. CT abd unrevealing. Has ARF. No clear source of sepsis.  The foot does not appear overwhelmingly infected.  [place] has PNA with aspiration component. Sputum cx pending.  Stool c dif neg, bcx neg, Urine with > 100 K yeast. MRSA PCR neg.   Recommendations Await sputum cx Cont cefepime and add flagyl for aspiration.  Can dc vanco due to negative MRSA PCR and cultures negative.   Would add fluconazole  Thank you very much for the consult. Will follow with you.  [place], Iraida Cragin P   06/01/2016, 4:41 PM

## 2016-06-01 NOTE — Progress Notes (Signed)
PHARMACY - CRITICAL CARE PROGRESS NOTE  Pharmacy Consult for Electrolytes  Indication: electrolyte management   Allergies  Allergen Reactions  . Rofecoxib Other (See Comments)    Reaction: unknown  . Penicillins Rash and Other (See Comments)    Has patient had a PCN reaction causing immediate rash, facial/tongue/throat swelling, SOB or lightheadedness with hypotension: Unknown Has patient had a PCN reaction causing severe rash involving mucus membranes or skin necrosis: Unknown Has patient had a PCN reaction that required hospitalization: Unknown Has patient had a PCN reaction occurring within the last 10 years: Unknown If all of the above answers are "NO", then may proceed with Cephalosporin use.     Patient Measurements: Height: 5' (152.4 cm) Weight: (!) 315 lb 7.7 oz (143.1 kg) IBW/kg (Calculated) : 45.5 Adjusted Body Weight:   Vital Signs: Temp: 98.6 F (37 C) (05/24 2337) Temp Source: Axillary (05/24 2337) BP: 97/50 (05/25 0400) Pulse Rate: 100 (05/25 0400) Intake/Output from previous day: 05/24 0701 - 05/25 0700 In: 2566.7 [I.V.:1536.7; NG/GT:400; IV Piggyback:600] Out: 1812 [Urine:57; Emesis/NG output:50] Intake/Output from this shift: Total I/O In: 953 [I.V.:513; Other:10; NG/GT:180; IV Piggyback:250] Out: 794 [Urine:22; Other:772] Vent settings for last 24 hours: Vent Mode: PCV FiO2 (%):  [30 %] 30 % Set Rate:  [12 bmp] 12 bmp PEEP:  [5 cmH20] 5 cmH20 Pressure Support:  [5 cmH20] 5 cmH20  Labs:  Recent Labs  03/31/16 1503  03/31/16 2129 05/30/16 0529  05/31/16 0541  05/31/16 1830 05/31/16 2227 06/01/16 0256  WBC 19.8*  --   --  10.3  --  9.3  --   --   --  4.8  HGB 14.4  --   --  13.4  --  11.9*  --   --   --  11.3*  HCT 44.6  --   --  40.2  --  34.6*  --   --   --  32.4*  PLT 438  --   --  455*  --  151  --   --   --  110*  APTT  --   --   --   --   --  53*  --   --   --  70*  INR  --   --  2.53 2.55  --   --   --   --   --   --   CREATININE  3.42*  < >  --  3.07*  < > 2.55*  < > 2.09* 1.96* 1.79*  MG  --   < >  --  1.6*  < > 1.6*  < > 1.9 1.8 1.9  PHOS  --   < >  --  7.5*  < > 4.8*  < > 3.5 3.3 3.2  ALBUMIN 2.1*  --   --  1.8*  < > 1.9*  < > 2.0* 2.0* 2.0*  PROT 5.3*  --   --  4.5*  --  4.5*  --   --   --   --   AST 34  --   --  44*  --  39  --   --   --   --   ALT 30  --   --  29  --  26  --   --   --   --   ALKPHOS 89  --   --  81  --  82  --   --   --   --  BILITOT 3.0*  --   --  4.0*  --  2.9*  --   --   --   --   < > = values in this interval not displayed. Estimated Creatinine Clearance: 43.5 mL/min (A) (by C-G formula based on SCr of 1.79 mg/dL (H)).   Recent Labs  05/31/16 1953 05/31/16 2330 06/01/16 0332  GLUCAP 125* 136* 150*    Microbiology: Recent Results (from the past 720 hour(s))  Blood Culture (routine x 2)     Status: None   Collection Time: 05/12/16 11:57 AM  Result Value Ref Range Status   Specimen Description BLOOD RIGHT ASSIST CONTROL  Final   Special Requests   Final    BOTTLES DRAWN AEROBIC AND ANAEROBIC Blood Culture adequate volume   Culture NO GROWTH 5 DAYS  Final   Report Status 05/17/2016 FINAL  Final  Blood Culture (routine x 2)     Status: None   Collection Time: 05/12/16 11:57 AM  Result Value Ref Range Status   Specimen Description BLOOD LEFT ARM  Final   Special Requests   Final    BOTTLES DRAWN AEROBIC AND ANAEROBIC Blood Culture results may not be optimal due to an excessive volume of blood received in culture bottles   Culture NO GROWTH 5 DAYS  Final   Report Status 05/17/2016 FINAL  Final  Aerobic/Anaerobic Culture (surgical/deep wound)     Status: None   Collection Time: 05/13/16  8:38 AM  Result Value Ref Range Status   Specimen Description WOUND LEFT FOOT  Final   Special Requests NONE  Final   Gram Stain   Final    MODERATE WBC PRESENT, PREDOMINANTLY PMN ABUNDANT GRAM POSITIVE COCCI IN PAIRS IN CLUSTERS Performed at [facility], [address],  [place], [place] [address]    Culture   Final    MODERATE STAPHYLOCOCCUS AUREUS ABUNDANT PEPTOSTREPTOCOCCUS SPECIES    Report Status 05/20/2016 FINAL  Final   Organism ID, Bacteria STAPHYLOCOCCUS AUREUS  Final      Susceptibility   Staphylococcus aureus - MIC*    CIPROFLOXACIN <=0.5 SENSITIVE Sensitive     ERYTHROMYCIN >=8 RESISTANT Resistant     GENTAMICIN <=0.5 SENSITIVE Sensitive     OXACILLIN <=0.25 SENSITIVE Sensitive     TETRACYCLINE <=1 SENSITIVE Sensitive     VANCOMYCIN <=0.5 SENSITIVE Sensitive     TRIMETH/SULFA <=10 SENSITIVE Sensitive     CLINDAMYCIN <=0.25 SENSITIVE Sensitive     RIFAMPIN <=0.5 SENSITIVE Sensitive     Inducible Clindamycin NEGATIVE Sensitive     * MODERATE STAPHYLOCOCCUS AUREUS  Urine culture     Status: None   Collection Time: 05/13/16  5:39 PM  Result Value Ref Range Status   Specimen Description URINE, CLEAN CATCH  Final   Special Requests NONE  Final   Culture   Final    NO GROWTH Performed at [facility], [address] [place], [place] [address]    Report Status 05/15/2016 FINAL  Final  MRSA PCR Screening     Status: None   Collection Time: 05/26/16  6:40 AM  Result Value Ref Range Status   MRSA by PCR NEGATIVE NEGATIVE Final    Comment:        The GeneXpert MRSA Assay (FDA approved for NASAL specimens only), is one component of a comprehensive MRSA colonization surveillance program. It is not intended to diagnose MRSA infection nor to guide or monitor treatment for MRSA infections.   C difficile  quick scan w PCR reflex     Status: None   Collection Time: 05/26/16  1:30 PM  Result Value Ref Range Status   C Diff antigen NEGATIVE NEGATIVE Final   C Diff toxin NEGATIVE NEGATIVE Final   C Diff interpretation No C. difficile detected.  Final  Gastrointestinal Panel by PCR , Stool     Status: None   Collection Time: 05/26/16  6:19 PM  Result Value Ref Range Status   Campylobacter species NOT DETECTED NOT DETECTED Final    Plesimonas shigelloides NOT DETECTED NOT DETECTED Final   Salmonella species NOT DETECTED NOT DETECTED Final   Yersinia enterocolitica NOT DETECTED NOT DETECTED Final   Vibrio species NOT DETECTED NOT DETECTED Final   Vibrio cholerae NOT DETECTED NOT DETECTED Final   Enteroaggregative E coli (EAEC) NOT DETECTED NOT DETECTED Final   Enteropathogenic E coli (EPEC) NOT DETECTED NOT DETECTED Final   Enterotoxigenic E coli (ETEC) NOT DETECTED NOT DETECTED Final   Shiga like toxin producing E coli (STEC) NOT DETECTED NOT DETECTED Final   Shigella/Enteroinvasive E coli (EIEC) NOT DETECTED NOT DETECTED Final   Cryptosporidium NOT DETECTED NOT DETECTED Final   Cyclospora cayetanensis NOT DETECTED NOT DETECTED Final   Entamoeba histolytica NOT DETECTED NOT DETECTED Final   Giardia lamblia NOT DETECTED NOT DETECTED Final   Adenovirus F40/41 NOT DETECTED NOT DETECTED Final   Astrovirus NOT DETECTED NOT DETECTED Final   Norovirus GI/GII NOT DETECTED NOT DETECTED Final   Rotavirus A NOT DETECTED NOT DETECTED Final   Sapovirus (I, II, IV, and V) NOT DETECTED NOT DETECTED Final  Blood Culture (routine x 2)     Status: None (Preliminary result)   Collection Time: 06-21-16  3:03 PM  Result Value Ref Range Status   Specimen Description BLOOD L AC  Final   Special Requests BOTTLES DRAWN AEROBIC AND ANAEROBIC BCAV  Final   Culture NO GROWTH 2 DAYS  Final   Report Status PENDING  Incomplete  Blood Culture (routine x 2)     Status: None (Preliminary result)   Collection Time: 06/21/2016  4:42 PM  Result Value Ref Range Status   Specimen Description BLOOD RIGHT NECK  Final   Special Requests   Final    BOTTLES DRAWN AEROBIC AND ANAEROBIC Blood Culture results may not be optimal due to an excessive volume of blood received in culture bottles   Culture NO GROWTH 2 DAYS  Final   Report Status PENDING  Incomplete  MRSA PCR Screening     Status: None   Collection Time: Jun 21, 2016  8:35 PM  Result Value Ref Range  Status   MRSA by PCR NEGATIVE NEGATIVE Final    Comment:        The GeneXpert MRSA Assay (FDA approved for NASAL specimens only), is one component of a comprehensive MRSA colonization surveillance program. It is not intended to diagnose MRSA infection nor to guide or monitor treatment for MRSA infections.   Urine culture     Status: Abnormal   Collection Time: 05/30/16  1:51 AM  Result Value Ref Range Status   Specimen Description URINE, RANDOM  Final   Special Requests NONE  Final   Culture >=100,000 COLONIES/mL YEAST (A)  Final   Report Status 05/31/2016 FINAL  Final  Culture, respiratory (NON-Expectorated)     Status: None (Preliminary result)   Collection Time: 05/30/16  2:45 AM  Result Value Ref Range Status   Specimen Description TRACHEAL ASPIRATE  Final   Special  Requests NONE  Final   Gram Stain   Final    FEW WBC PRESENT, PREDOMINANTLY MONONUCLEAR MODERATE YEAST FEW GRAM POSITIVE COCCI RARE GRAM VARIABLE ROD    Culture   Final    CULTURE REINCUBATED FOR BETTER GROWTH Performed at [facility], [address], [place], [place] [address]    Report Status PENDING  Incomplete    Medications:  Prescriptions Prior to Admission  Medication Sig Dispense Refill Last Dose  . apixaban (ELIQUIS) 5 MG TABS tablet Take 1 tablet (5 mg total) by mouth 2 (two) times daily. 60 tablet 0 06/07/2016 at am  . clindamycin (CLEOCIN) 300 MG capsule Take 1 capsule (300 mg total) by mouth 4 (four) times daily. 24 capsule 0 06/04/2016 at am  . digoxin (LANOXIN) 0.25 MG tablet Take 1 tablet (0.25 mg total) by mouth daily.   05/18/2016 at am  . diltiazem (CARDIZEM CD) 180 MG 24 hr capsule Take 1 capsule (180 mg total) by mouth daily.   05/27/2016 at am  . furosemide (LASIX) 40 MG tablet Take 1 tablet (40 mg total) by mouth daily. 30 tablet  05/13/2016 at am  . HYDROcodone-acetaminophen (NORCO) 7.5-325 MG tablet Take 1 tablet by mouth every 6 (six) hours as needed for moderate pain. 30  tablet 0 prn at prn  . Insulin Glargine (LANTUS SOLOSTAR) 100 UNIT/ML Solostar Pen Inject 50 Units into the skin at bedtime. 15 mL 11 05/28/2016 at qhs  . levofloxacin (LEVAQUIN) 500 MG tablet Take 1 tablet (500 mg total) by mouth daily. 6 tablet 0 05/10/2016 at am  . loperamide (IMODIUM A-D) 2 MG tablet Take 2 mg by mouth every 8 (eight) hours as needed for diarrhea or loose stools.   prn at prn  . metoprolol (LOPRESSOR) 50 MG tablet Take 1 tablet (50 mg total) by mouth 2 (two) times daily. 180 tablet 3 06/01/2016 at am  . norethindrone (AYGESTIN) 5 MG tablet Take 1 tablet (5 mg total) by mouth 3 (three) times daily. 30 tablet 0 05/15/2016 at am  . nystatin (MYCOSTATIN/NYSTOP) powder Apply 1 g topically as needed (for rash and redness).   prn at prn  . ondansetron (ZOFRAN) 4 MG tablet Take 1 tablet (4 mg total) by mouth every 6 (six) hours as needed for nausea. 20 tablet 0 prn at prn  . rosuvastatin (CRESTOR) 10 MG tablet Take 1 tablet (10 mg total) by mouth daily after supper. 90 tablet 3 05/28/2016 at pm  . senna-docusate (SENOKOT-S) 8.6-50 MG tablet Take 1 tablet by mouth 2 (two) times daily.   05/26/2016 at am    Assessment: 5/24: K @ 18:00 = 3.3   Goal of Therapy:  Normalization of electrolytes   Plan:  KCl 10 mEq IV X 3 ordered to be given around 5/24 @ 22:00. Will recheck electrolytes on 5/25 @ 2:00.   5/25 @ 0256 K 3.6, Mg 1.9. No further supplementation warranted at this time. Will follow up w/ BMP during am labs.  Thomasene Ripple, PharmD, BCPS Clinical Pharmacist 06/01/2016

## 2016-06-01 NOTE — Progress Notes (Signed)
Notified MD Conforti that AM CXR revealed that ET tube needed to be advanced 2 cm for optimal placement.   0830 Laura Kline, RT notified of CXR results and per MD Conforti to proceed with advancement. ET advanced 2 cm. Now measuring at 24 cm at the lip.  Will continue to monitor patient.

## 2016-06-01 NOTE — Progress Notes (Signed)
[facility] - [place] at [facility]   PATIENT NAME: Laura Kline    MR#:  [id number]  DATE OF BIRTH:  14-Aug-1955  SUBJECTIVE:   Patient here due to septic shock with multiorgan failure. Remains critically ill On CRRT, 2 vasopressors. Follows commands and is awake on the ventilator.  REVIEW OF SYSTEMS:    Review of Systems  Unable to perform ROS: Intubated    Nutrition: Tube feeds Tolerating Diet: Tube feeds & yes   DRUG ALLERGIES:   Allergies  Allergen Reactions  . Rofecoxib Other (See Comments)    Reaction: unknown  . Penicillins Rash and Other (See Comments)    Has patient had a PCN reaction causing immediate rash, facial/tongue/throat swelling, SOB or lightheadedness with hypotension: Unknown Has patient had a PCN reaction causing severe rash involving mucus membranes or skin necrosis: Unknown Has patient had a PCN reaction that required hospitalization: Unknown Has patient had a PCN reaction occurring within the last 10 years: Unknown If all of the above answers are "NO", then may proceed with Cephalosporin use.     VITALS:  Blood pressure (!) 72/56, pulse (!) 109, temperature 97.7 F (36.5 C), temperature source Oral, resp. rate (!) 24, height 5' (1.524 m), weight (!) 144.9 kg (319 lb 7.1 oz), SpO2 99 %.  PHYSICAL EXAMINATION:   Physical Exam  GENERAL:  61 y.o.-year-old obese patient lying in bed critically ill appearing.  EYES: Pupils equal, round, reactive to light. No scleral icterus.  HEENT: Head atraumatic, normocephalic. Oropharynx and nasopharynx clear. ET and OG tube in place.  NECK:  Supple, no jugular venous distention. No thyroid enlargement, no tenderness.  LUNGS: Normal breath sounds bilaterally, no wheezing, rales, rhonchi. No use of accessory muscles of respiration.  CARDIOVASCULAR: S1, S2 normal. No murmurs, rubs, or gallops.  ABDOMEN: Soft, nontender, nondistended. Bowel sounds present. No organomegaly or mass.  EXTREMITIES: No  cyanosis, clubbing, +1-2 edema b/l.    NEUROLOGIC: sedated & Intubated but follows simple commands.  PSYCHIATRIC: Sedated and intubated.  SKIN: No obvious rash, lesion, LLE ulcer with no acute drainage.  Ulcer located in between the first & 2nd Toes.     LABORATORY PANEL:   CBC  Recent Labs Lab 06/01/16 0256  WBC 4.8  HGB 11.3*  HCT 32.4*  PLT 110*   ------------------------------------------------------------------------------------------------------------------  Chemistries   Recent Labs Lab 06/01/16 0256  06/01/16 1210  NA 135  135  < > 135  K 3.6  3.6  < > 3.6  CL 104  104  < > 103  CO2 26  25  < > 23  GLUCOSE 160*  161*  < > 155*  BUN 36*  36*  < > 32*  CREATININE 1.75*  1.79*  < > 1.65*  CALCIUM 8.4*  8.3*  < > 8.2*  MG 1.9  < > 1.8  AST 47*  --   --   ALT 29  --   --   ALKPHOS 83  --   --   BILITOT 2.9*  --   --   < > = values in this interval not displayed. ------------------------------------------------------------------------------------------------------------------  Cardiac Enzymes  Recent Labs Lab 06-22-2016 1503  TROPONINI 0.04*   ------------------------------------------------------------------------------------------------------------------  RADIOLOGY:  Dg Chest Port 1 View  Result Date: 06/01/2016 CLINICAL DATA:  Respiratory failure . EXAM: PORTABLE CHEST 1 VIEW COMPARISON:  05/31/2016 . FINDINGS: Endotracheal tube 1.5 cm above the lower portion of the carina. Proximal repositioning of approximately 2 cm suggested. NG tube  and bilateral central lines in stable position. Heart size stable. Low lung volumes with mild basilar atelectasis. No pneumothorax . IMPRESSION: 1. Endotracheal tube 1.5 cm above the lower portion of the carina. Proximal repositioning of approximately 2 cm should be considered. NG tube and bilateral central lines in stable position. 2. Low lung volumes with mild bibasilar atelectasis. These results will be called to the  ordering clinician or representative by the Radiologist Assistant, and communication documented in the PACS or zVision Dashboard. Electronically Signed   By: Maisie Fushomas  Register   On: 06/01/2016 06:58   Dg Chest Port 1 View  Result Date: 05/31/2016 CLINICAL DATA:  Respiratory failure. EXAM: PORTABLE CHEST 1 VIEW COMPARISON:  05/30/2016. FINDINGS: Endotracheal tube, NG tube, right IJ line, left subclavian line in stable position. Heart size stable. Previously identified linear lucency over the right chest no longer identified. Low lung volumes with basilar atelectasis. IMPRESSION: 1. Lines and tubes in stable position.  No pneumothorax. 2. Low lung volumes with basilar atelectasis. Chest is stable from prior exam. Electronically Signed   By: Maisie Fushomas  Register   On: 05/31/2016 06:38   Dg Chest Port 1 View  Result Date: 05/30/2016 CLINICAL DATA:  61 year old female undergoing right IJ catheter exchange EXAM: PORTABLE CHEST 1 VIEW COMPARISON:  Chest x-ray obtained earlier today at 16:11 p.m. FINDINGS: The right IJ central venous catheter has been exchanged for a non tunneled hemodialysis catheter. The catheter tip projects over the superior cavoatrial junction. Stable position of left subclavian approach central venous catheter with the tip also at the superior cavoatrial junction. The patient remains intubated. The tip of the endotracheal tube is 2.5 cm above the carina. A gastric tube is present, the tip lies off the field of view below the diaphragm presumably within the stomach. Stable cardiac and mediastinal contours. A linear vertically oriented lucency in the right mid to upper lung remains present and unchanged. This is of indeterminate clinical significance. Respiratory volumes remain very low and there is bibasilar atelectasis. No overt pulmonary edema. No acute osseous abnormality. IMPRESSION: 1. The right IJ central venous catheter has been exchanged for a non tunneled hemodialysis catheter the tip of  which projects over the superior cavoatrial junction. 2. Other support apparatus in stable and satisfactory position. 3. No significant interval change in the appearance of the lungs with persistent very low volumes and bibasilar atelectasis. Electronically Signed   By: Malachy MoanHeath  McCullough M.D.   On: 05/30/2016 17:18   Dg Chest Port 1 View  Result Date: 05/30/2016 CLINICAL DATA:  Status post central line placement EXAM: PORTABLE CHEST 1 VIEW COMPARISON:  Nov 18, 2016 FINDINGS: Cardiac shadow is stable. Endotracheal tube and nasogastric catheter are again seen and stable. Right jugular central line is again seen and stable. A new left subclavian central line is noted with the catheter tip in the proximal superior vena cava. No pneumothorax is noted. No bony abnormality is seen. IMPRESSION: Status post left central venous line without pneumothorax. The remainder the exam is stable from the prior study. Electronically Signed   By: Alcide CleverMark  Lukens M.D.   On: 05/30/2016 16:27     ASSESSMENT AND PLAN:   61 year old female with past medical history of peripheral vascular disease, morbid obesity, insulin-dependent diabetes, hypertension, hyperlipidemia, essential hypertension, recent admission for left lower extremity cellulitis and acute kidney injury of presents to the hospital due to altered mental status and noted to be in septic shock.  1. Sepsis with septic shock-source unclear but suspected to be some  due to left lower extremity ulcer/cellulitis. -Seen by infectious disease and continue IV vancomycin, cefepime and Flagyl and doxycycline and cultures so far (-).  - Urine growing Yeast but unlikely has clinical significance.   2. Septic shock- now back on 2 vasopressors with Levophed, Vasopressin. -continue aggressive IV fluid hydration, vasopressors. Continue empiric antibiotics as mentioned above.  - wean pressors with goal MAP of 55.  3. Acute respiratory failure-patient intubated due to obtundation and  altered mental status and unable to maintain her airway. -Continue vent support, now on 30% FiO2 and wean as per pulmonary/intensivist. Follow serial ABGs.  4. Oliguric renal failure-secondary to sepsis and septic shock. - seen by nephrology, cont. CRRT as pt. Remains Anuric.   5. Metabolic acidosis-secondary to sepsis and also renal failure. - cont. CRRT and supportive care as mentioned above.  Follow serial ABG's.   6. Diabetes type 2 without complication- BS labile and cont. Insulin gtt.   7. Altered mental status-metabolic encephalopathy secondary to sepsis, septic shock and multiorgan failure. Follow mental status once patient is extubated and her sepsis has improved.  Follows simple commands.   8. Hypokalemia - improved w/ supplementation.   Patient is critically ill with multiorgan failure. Prognosis is poor. ?? Palliative Care consult.   All the records are reviewed and case discussed with Care Management/Social Worker. Management plans discussed with the patient, family and they are in agreement.  CODE STATUS: Full  DVT Prophylaxis: Hep. SQ  TOTAL TIME TAKING CARE OF THIS PATIENT: 30 minutes.   POSSIBLE D/C unclear, DEPENDING ON CLINICAL CONDITION and progress.   Houston Siren M.D on 06/01/2016 at 2:18 PM  Between 7am to 6pm - Pager - [phone]  After 6pm go to [email or web address] - Social research officer, government  [facility] [place] [facility]  Office  [phone]  CC: Primary care physician; Patient, No Pcp Per

## 2016-06-02 ENCOUNTER — Inpatient Hospital Stay: Payer: BLUE CROSS/BLUE SHIELD

## 2016-06-02 LAB — RENAL FUNCTION PANEL
ALBUMIN: 2 g/dL — AB (ref 3.5–5.0)
ALBUMIN: 2.3 g/dL — AB (ref 3.5–5.0)
ALBUMIN: 2.4 g/dL — AB (ref 3.5–5.0)
ANION GAP: 8 (ref 5–15)
ANION GAP: 7 (ref 5–15)
ANION GAP: 8 (ref 5–15)
Albumin: 1.7 g/dL — ABNORMAL LOW (ref 3.5–5.0)
Albumin: 1.9 g/dL — ABNORMAL LOW (ref 3.5–5.0)
Anion gap: 8 (ref 5–15)
Anion gap: 10 (ref 5–15)
BUN: 29 mg/dL — ABNORMAL HIGH (ref 6–20)
BUN: 27 mg/dL — ABNORMAL HIGH (ref 6–20)
BUN: 28 mg/dL — ABNORMAL HIGH (ref 6–20)
BUN: 28 mg/dL — AB (ref 6–20)
BUN: 26 mg/dL — ABNORMAL HIGH (ref 6–20)
CALCIUM: 8.8 mg/dL — AB (ref 8.9–10.3)
CHLORIDE: 103 mmol/L (ref 101–111)
CHLORIDE: 105 mmol/L (ref 101–111)
CHLORIDE: 104 mmol/L (ref 101–111)
CO2: 25 mmol/L (ref 22–32)
CO2: 23 mmol/L (ref 22–32)
CO2: 25 mmol/L (ref 22–32)
CO2: 24 mmol/L (ref 22–32)
CO2: 24 mmol/L (ref 22–32)
CREATININE: 1.26 mg/dL — AB (ref 0.44–1.00)
CREATININE: 1.24 mg/dL — AB (ref 0.44–1.00)
CREATININE: 1.13 mg/dL — AB (ref 0.44–1.00)
Calcium: 8.4 mg/dL — ABNORMAL LOW (ref 8.9–10.3)
Calcium: 8.5 mg/dL — ABNORMAL LOW (ref 8.9–10.3)
Calcium: 8.5 mg/dL — ABNORMAL LOW (ref 8.9–10.3)
Calcium: 8.8 mg/dL — ABNORMAL LOW (ref 8.9–10.3)
Chloride: 107 mmol/L (ref 101–111)
Chloride: 103 mmol/L (ref 101–111)
Creatinine, Ser: 1.37 mg/dL — ABNORMAL HIGH (ref 0.44–1.00)
Creatinine, Ser: 1.34 mg/dL — ABNORMAL HIGH (ref 0.44–1.00)
GFR calc Af Amer: 47 mL/min — ABNORMAL LOW (ref >60)
GFR calc non Af Amer: 40 mL/min — ABNORMAL LOW (ref >60)
GFR calc non Af Amer: 45 mL/min — ABNORMAL LOW (ref >60)
GFR calc non Af Amer: 51 mL/min — ABNORMAL LOW (ref >60)
GFR, EST AFRICAN AMERICAN: 48 mL/min — AB (ref >60)
GFR, EST AFRICAN AMERICAN: 52 mL/min — AB (ref >60)
GFR, EST AFRICAN AMERICAN: 53 mL/min — AB (ref >60)
GFR, EST AFRICAN AMERICAN: 59 mL/min — AB (ref >60)
GFR, EST NON AFRICAN AMERICAN: 41 mL/min — AB (ref >60)
GFR, EST NON AFRICAN AMERICAN: 46 mL/min — AB (ref >60)
GLUCOSE: 204 mg/dL — AB (ref 65–99)
GLUCOSE: 151 mg/dL — AB (ref 65–99)
GLUCOSE: 149 mg/dL — AB (ref 65–99)
Glucose, Bld: 151 mg/dL — ABNORMAL HIGH (ref 65–99)
Glucose, Bld: 165 mg/dL — ABNORMAL HIGH (ref 65–99)
PHOSPHORUS: 2.4 mg/dL — AB (ref 2.5–4.6)
PHOSPHORUS: 2.6 mg/dL (ref 2.5–4.6)
PHOSPHORUS: 2.7 mg/dL (ref 2.5–4.6)
PHOSPHORUS: 2.7 mg/dL (ref 2.5–4.6)
POTASSIUM: 4.1 mmol/L (ref 3.5–5.1)
POTASSIUM: 3.7 mmol/L (ref 3.5–5.1)
POTASSIUM: 3.9 mmol/L (ref 3.5–5.1)
Phosphorus: 2.5 mg/dL (ref 2.5–4.6)
Potassium: 4 mmol/L (ref 3.5–5.1)
Potassium: 4 mmol/L (ref 3.5–5.1)
SODIUM: 136 mmol/L (ref 135–145)
Sodium: 137 mmol/L (ref 135–145)
Sodium: 139 mmol/L (ref 135–145)
Sodium: 136 mmol/L (ref 135–145)
Sodium: 136 mmol/L (ref 135–145)

## 2016-06-02 LAB — LACTIC ACID, PLASMA: LACTIC ACID, VENOUS: 3.3 mmol/L — AB (ref 0.5–1.9)

## 2016-06-02 LAB — CULTURE, RESPIRATORY

## 2016-06-02 LAB — GLUCOSE, CAPILLARY
GLUCOSE-CAPILLARY: 142 mg/dL — AB (ref 65–99)
GLUCOSE-CAPILLARY: 166 mg/dL — AB (ref 65–99)
Glucose-Capillary: 133 mg/dL — ABNORMAL HIGH (ref 65–99)
Glucose-Capillary: 163 mg/dL — ABNORMAL HIGH (ref 65–99)
Glucose-Capillary: 176 mg/dL — ABNORMAL HIGH (ref 65–99)
Glucose-Capillary: 180 mg/dL — ABNORMAL HIGH (ref 65–99)

## 2016-06-02 LAB — CBC
HCT: 30.6 % — ABNORMAL LOW (ref 35.0–47.0)
Hemoglobin: 10.5 g/dL — ABNORMAL LOW (ref 12.0–16.0)
MCH: 31.1 pg (ref 26.0–34.0)
MCHC: 34.4 g/dL (ref 32.0–36.0)
MCV: 90.3 fL (ref 80.0–100.0)
PLATELETS: 68 10*3/uL — AB (ref 150–440)
RBC: 3.39 MIL/uL — ABNORMAL LOW (ref 3.80–5.20)
RDW: 13.9 % (ref 11.5–14.5)
WBC: 4.5 10*3/uL (ref 3.6–11.0)

## 2016-06-02 LAB — URINE CULTURE

## 2016-06-02 LAB — PHOSPHORUS: PHOSPHORUS: 2.5 mg/dL (ref 2.5–4.6)

## 2016-06-02 LAB — BLOOD GAS, ARTERIAL
ACID-BASE DEFICIT: 0.7 mmol/L (ref 0.0–2.0)
Bicarbonate: 21.8 mmol/L (ref 20.0–28.0)
FIO2: 0.3
LHR: 12 {breaths}/min
O2 SAT: 99 %
PCO2 ART: 28 mmHg — AB (ref 32.0–48.0)
PEEP/CPAP: 5 cmH2O
PH ART: 7.5 — AB (ref 7.350–7.450)
Patient temperature: 37
Pressure control: 10 cmH2O
pO2, Arterial: 121 mmHg — ABNORMAL HIGH (ref 83.0–108.0)

## 2016-06-02 LAB — BASIC METABOLIC PANEL
Anion gap: 8 (ref 5–15)
BUN: 28 mg/dL — AB (ref 6–20)
CO2: 24 mmol/L (ref 22–32)
CREATININE: 1.27 mg/dL — AB (ref 0.44–1.00)
Calcium: 8.5 mg/dL — ABNORMAL LOW (ref 8.9–10.3)
Chloride: 105 mmol/L (ref 101–111)
GFR calc Af Amer: 51 mL/min — ABNORMAL LOW (ref >60)
GFR, EST NON AFRICAN AMERICAN: 44 mL/min — AB (ref >60)
GLUCOSE: 150 mg/dL — AB (ref 65–99)
Potassium: 4 mmol/L (ref 3.5–5.1)
SODIUM: 137 mmol/L (ref 135–145)

## 2016-06-02 LAB — CULTURE, RESPIRATORY W GRAM STAIN

## 2016-06-02 LAB — HEPARIN INDUCED PLATELET AB (HIT ANTIBODY): HEPARIN INDUCED PLT AB: 0.116 {OD_unit} (ref 0.000–0.400)

## 2016-06-02 LAB — MAGNESIUM
MAGNESIUM: 1.9 mg/dL (ref 1.7–2.4)
Magnesium: 1.9 mg/dL (ref 1.7–2.4)
Magnesium: 1.9 mg/dL (ref 1.7–2.4)
Magnesium: 2.1 mg/dL (ref 1.7–2.4)
Magnesium: 1.9 mg/dL (ref 1.7–2.4)

## 2016-06-02 LAB — PROCALCITONIN: PROCALCITONIN: 5.41 ng/mL

## 2016-06-02 LAB — APTT: aPTT: 80 seconds — ABNORMAL HIGH (ref 24–36)

## 2016-06-02 MED ORDER — MAGNESIUM SULFATE IN D5W 1-5 GM/100ML-% IV SOLN
1.0000 g | Freq: Once | INTRAVENOUS | Status: AC
  Administered 2016-06-02: 1 g via INTRAVENOUS
  Filled 2016-06-02: qty 100

## 2016-06-02 MED ORDER — METOCLOPRAMIDE HCL 5 MG/ML IJ SOLN
10.0000 mg | Freq: Three times a day (TID) | INTRAMUSCULAR | Status: DC
  Administered 2016-06-02 – 2016-06-05 (×9): 10 mg via INTRAVENOUS
  Filled 2016-06-02 (×9): qty 2

## 2016-06-02 MED ORDER — IOPAMIDOL (ISOVUE-300) INJECTION 61%
15.0000 mL | INTRAVENOUS | Status: AC
  Administered 2016-06-02 (×2): 15 mL via ORAL

## 2016-06-02 MED ORDER — PRO-STAT SUGAR FREE PO LIQD
30.0000 mL | Freq: Two times a day (BID) | ORAL | Status: DC
  Administered 2016-06-02 – 2016-06-05 (×7): 30 mL via ORAL

## 2016-06-02 MED ORDER — ALBUMIN HUMAN 25 % IV SOLN
12.5000 g | Freq: Once | INTRAVENOUS | Status: AC
  Administered 2016-06-02: 12.5 g via INTRAVENOUS
  Filled 2016-06-02: qty 50

## 2016-06-02 MED ORDER — METOPROLOL TARTRATE 5 MG/5ML IV SOLN
2.5000 mg | INTRAVENOUS | Status: DC | PRN
  Administered 2016-06-06: 5 mg via INTRAVENOUS
  Filled 2016-06-02 (×2): qty 5

## 2016-06-02 MED ORDER — POTASSIUM CHLORIDE 10 MEQ/50ML IV SOLN
10.0000 meq | Freq: Once | INTRAVENOUS | Status: AC
  Administered 2016-06-02: 10 meq via INTRAVENOUS
  Filled 2016-06-02: qty 50

## 2016-06-02 MED ORDER — ALBUMIN HUMAN 5 % IV SOLN
12.5000 g | Freq: Once | INTRAVENOUS | Status: DC
  Filled 2016-06-02: qty 250

## 2016-06-02 MED ORDER — DEXTROSE-NACL 5-0.45 % IV SOLN
INTRAVENOUS | Status: DC
  Administered 2016-06-02 – 2016-06-05 (×3): via INTRAVENOUS

## 2016-06-02 MED ORDER — ALBUMIN HUMAN 25 % IV SOLN
25.0000 g | Freq: Four times a day (QID) | INTRAVENOUS | Status: AC
Start: 2016-06-02 — End: 2016-06-05
  Administered 2016-06-02 – 2016-06-05 (×12): 25 g via INTRAVENOUS
  Filled 2016-06-02 (×12): qty 100

## 2016-06-02 NOTE — Progress Notes (Signed)
[facility]  ROUNDING NOTE   Subjective:   CRRT 4K bath Norepinephrine gtt Vasopressin gtt  UF 2105 (2041) net -46.7 (+698)  Started on fluconazole.  IV albumin given yesterday.   Lactic acid elevated. With more stool. CT abd/pelvis ordered.   Objective:  Vital signs in last 24 hours:  Temp:  [97.5 F (36.4 C)-98.6 F (37 C)] 98.1 F (36.7 C) (05/26 0720) Pulse Rate:  [98-125] 101 (05/26 0800) Resp:  [19-27] 25 (05/26 0800) BP: (65-129)/(21-90) 96/60 (05/26 0800) SpO2:  [99 %-100 %] 100 % (05/26 0824) FiO2 (%):  [28 %-30 %] 28 % (05/26 0824) Weight:  [145.6 kg (320 lb 15.8 oz)] 145.6 kg (320 lb 15.8 oz) (05/26 0400)  Weight change: 0.7 kg (1 lb 8.7 oz) Filed Weights   05/31/16 0500 06/01/16 0500 06/02/16 0400  Weight: (!) 143.1 kg (315 lb 7.7 oz) (!) 144.9 kg (319 lb 7.1 oz) (!) 145.6 kg (320 lb 15.8 oz)    Intake/Output: I/O last 3 completed shifts: In: 3699.2 [I.V.:1578.2; Other:30; ZY/SA:6301; IV SWFUXNATF:573] Out: 2202 [Urine:68; RKYHC:6237; Stool:500]   Intake/Output this shift:  Total I/O In: 176.6 [I.V.:66.6; NG/GT:110] Out: 96 [Other:96]  Physical Exam: General: Critically ill  Head: ETT NGT  Eyes: Anicteric, PERRL  Neck: RIJ temp HD catheter, left subclavian  Lungs:  Diminished bilaterally, Pressure control FiO2 28%  Heart: irregular  Abdomen:  Soft, nontender, obese  Extremities:  + peripheral edema.  Neurologic: Intubated, sedated  Skin: Left foot incision - clean, dry and intact  Access: RIJ temp HD catheter Dr. Alva Garnet 6/28    Basic Metabolic Panel:  Recent Labs Lab 06/01/16 1210 06/01/16 1630 06/01/16 2010 06/02/16 0049 06/02/16 0455 06/02/16 0456  NA 135 135 138 137  --  139  K 3.6 3.7 3.8 3.7  --  3.9  CL 103 103 105 105  --  107  CO2 23 25 25 24   --  25  GLUCOSE 155* 155* 147* 151*  --  151*  BUN 32* 31* 32* 29*  --  28*  CREATININE 1.65* 1.54* 1.46* 1.37*  --  1.34*  CALCIUM 8.2* 8.3* 8.5* 8.4*  --  8.5*   MG 1.8 1.8 1.9 1.9 1.9  --   PHOS 3.0 2.8 3.0 2.7 2.5 2.7    Liver Function Tests:  Recent Labs Lab 05/27/16 0643 05/13/2016 1503 05/30/16 0529  05/31/16 0541  06/01/16 0256  06/01/16 1210 06/01/16 1630 06/01/16 2010 06/02/16 0049 06/02/16 0456  AST 29 34 44*  --  39  --  47*  --   --   --   --   --   --   ALT 89* 30 29  --  26  --  29  --   --   --   --   --   --   ALKPHOS 121 89 81  --  82  --  83  --   --   --   --   --   --   BILITOT 1.4* 3.0* 4.0*  --  2.9*  --  2.9*  --   --   --   --   --   --   PROT 4.7* 5.3* 4.5*  --  4.5*  --  4.4*  --   --   --   --   --   --   ALBUMIN 1.9* 2.1* 1.8*  < > 1.9*  < > 1.9*  2.0*  < > 1.4* 1.6*  1.8* 1.7* 2.0*  < > = values in this interval not displayed.  Recent Labs Lab 05/19/2016 1503 05/28/2016 2050  LIPASE 16  --   AMYLASE  --  71   No results for input(s): AMMONIA in the last 168 hours.  CBC:  Recent Labs Lab 05/27/16 0643 05/25/2016 1503 05/30/16 0529 05/31/16 0541 06/01/16 0256  WBC 2.9* 19.8* 10.3 9.3 4.8  NEUTROABS  --  14.0*  --   --   --   HGB 11.7* 14.4 13.4 11.9* 11.3*  HCT 34.9* 44.6 40.2 34.6* 32.4*  MCV 92.9 95.5 92.9 92.5 92.3  PLT 109* 438 455* 151 110*    Cardiac Enzymes:  Recent Labs Lab 05/26/2016 1503  TROPONINI 0.04*    BNP: Invalid input(s): POCBNP  CBG:  Recent Labs Lab 06/01/16 1623 06/01/16 2005 06/01/16 2346 06/02/16 0357 06/02/16 0739  GLUCAP 151* 137* 146* 142* 133*    Microbiology: Results for orders placed or performed during the hospital encounter of 05/22/2016  Blood Culture (routine x 2)     Status: None (Preliminary result)   Collection Time: 05/18/2016  3:03 PM  Result Value Ref Range Status   Specimen Description BLOOD L AC  Final   Special Requests BOTTLES DRAWN AEROBIC AND ANAEROBIC BCAV  Final   Culture NO GROWTH 3 DAYS  Final   Report Status PENDING  Incomplete  Blood Culture (routine x 2)     Status: None (Preliminary result)   Collection Time: 05/31/2016  4:42  PM  Result Value Ref Range Status   Specimen Description BLOOD RIGHT NECK  Final   Special Requests   Final    BOTTLES DRAWN AEROBIC AND ANAEROBIC Blood Culture results may not be optimal due to an excessive volume of blood received in culture bottles   Culture NO GROWTH 3 DAYS  Final   Report Status PENDING  Incomplete  MRSA PCR Screening     Status: None   Collection Time: 06/02/2016  8:35 PM  Result Value Ref Range Status   MRSA by PCR NEGATIVE NEGATIVE Final    Comment:        The GeneXpert MRSA Assay (FDA approved for NASAL specimens only), is one component of a comprehensive MRSA colonization surveillance program. It is not intended to diagnose MRSA infection nor to guide or monitor treatment for MRSA infections.   Urine culture     Status: Abnormal   Collection Time: 05/30/16  1:51 AM  Result Value Ref Range Status   Specimen Description URINE, RANDOM  Final   Special Requests NONE  Final   Culture >=100,000 COLONIES/mL YEAST (A)  Final   Report Status 05/31/2016 FINAL  Final  Culture, respiratory (NON-Expectorated)     Status: None (Preliminary result)   Collection Time: 05/30/16  2:45 AM  Result Value Ref Range Status   Specimen Description TRACHEAL ASPIRATE  Final   Special Requests NONE  Final   Gram Stain   Final    FEW WBC PRESENT, PREDOMINANTLY MONONUCLEAR MODERATE YEAST FEW GRAM POSITIVE COCCI RARE GRAM VARIABLE ROD    Culture   Final    ABUNDANT YEAST RARE FUNGUS (MOLD) ISOLATED, PROBABLE CONTAMINANT/COLONIZER (SAPROPHYTE). CONTACT MICROBIOLOGY IF FURTHER IDENTIFICATION REQUIRED [phone]. CULTURE REINCUBATED FOR BETTER GROWTH Performed at [facility], [place] [address], [place], [place] [address]    Report Status PENDING  Incomplete  C difficile quick scan w PCR reflex     Status: None   Collection Time: 06/01/16 12:18 PM  Result  Value Ref Range Status   C Diff antigen NEGATIVE NEGATIVE Final   C Diff toxin NEGATIVE NEGATIVE Final   C Diff  interpretation No C. difficile detected.  Final    Coagulation Studies: No results for input(s): LABPROT, INR in the last 72 hours.  Urinalysis:  Recent Labs  06/01/16 1218  COLORURINE AMBER*  LABSPEC 1.026  PHURINE 5.0  GLUCOSEU NEGATIVE  HGBUR LARGE*  BILIRUBINUR SMALL*  KETONESUR 5*  PROTEINUR 100*  NITRITE NEGATIVE  LEUKOCYTESUR SMALL*      Imaging: Dg Chest Port 1 View  Result Date: 06/01/2016 CLINICAL DATA:  Respiratory failure . EXAM: PORTABLE CHEST 1 VIEW COMPARISON:  05/31/2016 . FINDINGS: Endotracheal tube 1.5 cm above the lower portion of the carina. Proximal repositioning of approximately 2 cm suggested. NG tube and bilateral central lines in stable position. Heart size stable. Low lung volumes with mild basilar atelectasis. No pneumothorax . IMPRESSION: 1. Endotracheal tube 1.5 cm above the lower portion of the carina. Proximal repositioning of approximately 2 cm should be considered. NG tube and bilateral central lines in stable position. 2. Low lung volumes with mild bibasilar atelectasis. These results will be called to the ordering clinician or representative by the Radiologist Assistant, and communication documented in the PACS or zVision Dashboard. Electronically Signed   By: Marcello Moores  Register   On: 06/01/2016 06:58     Medications:   . anticoagulant sodium citrate    . ceFEPime (MAXIPIME) IV Stopped (06/02/16 0303)  . feeding supplement (VITAL HIGH PROTEIN) 1,000 mL (06/01/16 1915)  . fentaNYL 100 mcg/hr (06/01/16 1940)  . fluconazole (DIFLUCAN) IV Stopped (06/01/16 2011)  . metronidazole 500 mg (06/02/16 0809)  . norepinephrine (LEVOPHED) Adult infusion 12 mcg/min (06/02/16 0645)  . pureflow 3 each (06/02/16 0511)  . sodium chloride Stopped (05/30/16 1212)  . vasopressin (PITRESSIN) infusion - *FOR SHOCK* 0.03 Units/min (06/01/16 2248)   . budesonide (PULMICORT) nebulizer solution  0.25 mg Nebulization Q6H  . chlorhexidine gluconate (MEDLINE KIT)  15  mL Mouth Rinse BID  . doxycycline  100 mg Oral Q12H  . feeding supplement (PRO-STAT SUGAR FREE 64)  30 mL Oral BID  . insulin aspart  0-20 Units Subcutaneous Q4H  . iopamidol  15 mL Oral Q1 Hr x 2  . ipratropium-albuterol  3 mL Nebulization Q6H  . mouth rinse  15 mL Mouth Rinse 10 times per day  . pantoprazole sodium  40 mg Per Tube Q1200  . sodium chloride flush  10-40 mL Intracatheter Q12H   anticoagulant sodium citrate, bisacodyl, fentaNYL, metoprolol tartrate, midazolam, [DISCONTINUED] ondansetron **OR** ondansetron (ZOFRAN) IV, sennosides, sodium chloride flush  Assessment/ Plan:  Ms. Laura Kline is a 61 y.o. white female with diabetes mellitus type II insulin dependent, hypertension, coronary artery disease, hyperlipidemia, peripheral vascular disease, atrial fibrillation who was admitted to [facility] on 05/09/2016  1. Acute renal failure with metabolic acidosis: anuric. Requiring vasopressors (norepinephrine and vasopressin). Baseline 1.1 on 05/18/16 Secondary to ATN from sepsis, hypotension, shock.  - Continue CVVHD 4K bath Therapy rate 2000, BFR 350 UF rate 13m/hr  - Restarted IV albumin  2. Sepsis/shock: requiring vasopressors - fluconazole, doxycycline, and cefepime.   3. Acute Respiratory failure requiring mechanical ventilation - Pressure control. FiO2 28%  Discussed case with Dr. CJefferson Fuel  L[place]: [place]5/26/20188:45 AM

## 2016-06-02 NOTE — Progress Notes (Addendum)
PHARMACY - CRITICAL CARE PROGRESS NOTE  Pharmacy Consult for Electrolytes  Indication: electrolyte management, pt on CRRT   Allergies  Allergen Reactions  . Rofecoxib Other (See Comments)    Reaction: unknown  . Penicillins Rash and Other (See Comments)    Has patient had a PCN reaction causing immediate rash, facial/tongue/throat swelling, SOB or lightheadedness with hypotension: Unknown Has patient had a PCN reaction causing severe rash involving mucus membranes or skin necrosis: Unknown Has patient had a PCN reaction that required hospitalization: Unknown Has patient had a PCN reaction occurring within the last 10 years: Unknown If all of the above answers are "NO", then may proceed with Cephalosporin use.     Patient Measurements: Height: 5' (152.4 cm) Weight: (!) 320 lb 15.8 oz (145.6 kg) IBW/kg (Calculated) : 45.5 Adjusted Body Weight:   Vital Signs: Temp: 97.7 F (36.5 C) (05/26 1200) Temp Source: Oral (05/26 1200) BP: 99/49 (05/26 1500) Pulse Rate: 106 (05/26 1500) Intake/Output from previous day: 05/25 0701 - 05/26 0700 In: 2596.3 [I.V.:955.3; NG/GT:1021; IV Piggyback:600] Out: 2643 [Urine:38; Stool:500] Intake/Output from this shift: Total I/O In: 905.8 [I.V.:248.3; NG/GT:357.5; IV Piggyback:300] Out: 2616 [Emesis/NG output:1860; Other:756] Vent settings for last 24 hours: Vent Mode: PCV FiO2 (%):  [28 %-35 %] 28 % Set Rate:  [12 bmp] 12 bmp PEEP:  [5 cmH20] 5 cmH20  Labs:  Recent Labs  05/31/16 0541  06/01/16 0256  06/02/16 0455 06/02/16 0456 06/02/16 0816 06/02/16 1428  WBC 9.3  --  4.8  --   --   --  4.5  --   HGB 11.9*  --  11.3*  --   --   --  10.5*  --   HCT 34.6*  --  32.4*  --   --   --  30.6*  --   PLT 151  --  110*  --   --   --  68*  --   APTT 53*  --  70*  --  80*  --   --   --   CREATININE 2.55*  < > 1.75*  1.79*  < >  --  1.34* 1.26*  1.27* 1.24*  MG 1.6*  < > 1.9  < > 1.9  --  1.9 2.1  PHOS 4.8*  < > 3.2  3.2  < > 2.5 2.7 2.5  2.6  ALBUMIN 1.9*  < > 1.9*  2.0*  < >  --  2.0* 1.9* 2.3*  PROT 4.5*  --  4.4*  --   --   --   --   --   AST 39  --  47*  --   --   --   --   --   ALT 26  --  29  --   --   --   --   --   ALKPHOS 82  --  83  --   --   --   --   --   BILITOT 2.9*  --  2.9*  --   --   --   --   --   BILIDIR  --   --  1.7*  --   --   --   --   --   IBILI  --   --  1.2*  --   --   --   --   --   < > = values in this interval not displayed. Estimated Creatinine Clearance: 63.5 mL/min (A) (by C-G formula  based on SCr of 1.24 mg/dL (H)).   Recent Labs  06/02/16 0357 06/02/16 0739 06/02/16 1157  GLUCAP 142* 133* 166*    Medications:  Prescriptions Prior to Admission  Medication Sig Dispense Refill Last Dose  . apixaban (ELIQUIS) 5 MG TABS tablet Take 1 tablet (5 mg total) by mouth 2 (two) times daily. 60 tablet 0 01-31-2016 at am  . clindamycin (CLEOCIN) 300 MG capsule Take 1 capsule (300 mg total) by mouth 4 (four) times daily. 24 capsule 0 01-31-2016 at am  . digoxin (LANOXIN) 0.25 MG tablet Take 1 tablet (0.25 mg total) by mouth daily.   01-31-2016 at am  . diltiazem (CARDIZEM CD) 180 MG 24 hr capsule Take 1 capsule (180 mg total) by mouth daily.   01-31-2016 at am  . furosemide (LASIX) 40 MG tablet Take 1 tablet (40 mg total) by mouth daily. 30 tablet  01-31-2016 at am  . HYDROcodone-acetaminophen (NORCO) 7.5-325 MG tablet Take 1 tablet by mouth every 6 (six) hours as needed for moderate pain. 30 tablet 0 prn at prn  . Insulin Glargine (LANTUS SOLOSTAR) 100 UNIT/ML Solostar Pen Inject 50 Units into the skin at bedtime. 15 mL 11 05/28/2016 at qhs  . levofloxacin (LEVAQUIN) 500 MG tablet Take 1 tablet (500 mg total) by mouth daily. 6 tablet 0 01-31-2016 at am  . loperamide (IMODIUM A-D) 2 MG tablet Take 2 mg by mouth every 8 (eight) hours as needed for diarrhea or loose stools.   prn at prn  . metoprolol (LOPRESSOR) 50 MG tablet Take 1 tablet (50 mg total) by mouth 2 (two) times daily. 180 tablet 3 01-31-2016 at  am  . norethindrone (AYGESTIN) 5 MG tablet Take 1 tablet (5 mg total) by mouth 3 (three) times daily. 30 tablet 0 01-31-2016 at am  . nystatin (MYCOSTATIN/NYSTOP) powder Apply 1 g topically as needed (for rash and redness).   prn at prn  . ondansetron (ZOFRAN) 4 MG tablet Take 1 tablet (4 mg total) by mouth every 6 (six) hours as needed for nausea. 20 tablet 0 prn at prn  . rosuvastatin (CRESTOR) 10 MG tablet Take 1 tablet (10 mg total) by mouth daily after supper. 90 tablet 3 05/28/2016 at pm  . senna-docusate (SENOKOT-S) 8.6-50 MG tablet Take 1 tablet by mouth 2 (two) times daily.   01-31-2016 at am    Assessment: 5/24: K @ 18:00 = 3.3   Goal of Therapy:  Normalization of electrolytes   Plan:  KCl 10 mEq IV X 3 ordered to be given around 5/24 @ 22:00. Will recheck electrolytes on 5/25 @ 2:00.   5/25 @ 0256 K 3.6, Mg 1.9. No further supplementation warranted at this time. Will follow up w/ BMP during am labs.  5/26 0816:  K 4.0,  Mag 1.9  Phos 2.5. Electrolytes WNL, No supplementation needed. F/u with next labs (currently ordered Q6h)  5/26 1428: Electrolytes WNL. No supplementation needed. F/u with next labs (currently ordered Q6h per Nephrology). Patient on CRRT  5/27 02:30 K+ 4.0, Mg 1.9, PO4 2.3. Will hold off replacing PO4 for now since patient is on renal replacement therapy.    Fulton ReekMatt Annaliyah Willig, PharmD, BCPS  06/03/16 4:08 AM

## 2016-06-02 NOTE — Progress Notes (Signed)
Had family meeting with husband, daughter and son present. Discussed currently condition, diagnosis and prognosis. Plan of care.  At this time, patient to remain full code. However family agrees that if no chance of meaningful recovery, will proceed with end of life planning.  Consult Cardiology  Consult Palliative care.   Lamont DowdyKOLLURU, Ruthmary Occhipinti

## 2016-06-02 NOTE — Progress Notes (Signed)
CRRT reconnected to patient, therapy started. Will continue to monitor patient.

## 2016-06-02 NOTE — Progress Notes (Signed)
CRRT temporarily disconnected from patient to transport to CT. Will continue to monitor patient.

## 2016-06-02 NOTE — Progress Notes (Signed)
PHARMACY - CRITICAL CARE PROGRESS NOTE  Pharmacy Consult for Electrolytes  Indication: electrolyte management   Allergies  Allergen Reactions  . Rofecoxib Other (See Comments)    Reaction: unknown  . Penicillins Rash and Other (See Comments)    Has patient had a PCN reaction causing immediate rash, facial/tongue/throat swelling, SOB or lightheadedness with hypotension: Unknown Has patient had a PCN reaction causing severe rash involving mucus membranes or skin necrosis: Unknown Has patient had a PCN reaction that required hospitalization: Unknown Has patient had a PCN reaction occurring within the last 10 years: Unknown If all of the above answers are "NO", then may proceed with Cephalosporin use.     Patient Measurements: Height: 5' (152.4 cm) Weight: (!) 320 lb 15.8 oz (145.6 kg) IBW/kg (Calculated) : 45.5 Adjusted Body Weight:   Vital Signs: Temp: 98.1 F (36.7 C) (05/26 0720) Temp Source: Oral (05/26 0720) BP: 98/36 (05/26 0900) Pulse Rate: 103 (05/26 0900) Intake/Output from previous day: 05/25 0701 - 05/26 0700 In: 2596.3 [I.V.:955.3; NG/GT:1021; IV Piggyback:600] Out: 2643 [Urine:38; Stool:500] Intake/Output from this shift: Total I/O In: 376.6 [I.V.:66.6; NG/GT:110; IV Piggyback:200] Out: 96 [Other:96] Vent settings for last 24 hours: Vent Mode: PCV FiO2 (%):  [28 %-30 %] 28 % Set Rate:  [12 bmp] 12 bmp PEEP:  [5 cmH20] 5 cmH20 Pressure Support:  [8 cmH20] 8 cmH20  Labs:  Recent Labs  05/31/16 0541  06/01/16 0256  06/02/16 0049 06/02/16 0455 06/02/16 0456 06/02/16 0816  WBC 9.3  --  4.8  --   --   --   --  4.5  HGB 11.9*  --  11.3*  --   --   --   --  10.5*  HCT 34.6*  --  32.4*  --   --   --   --  30.6*  PLT 151  --  110*  --   --   --   --  68*  APTT 53*  --  70*  --   --  80*  --   --   CREATININE 2.55*  < > 1.75*  1.79*  < > 1.37*  --  1.34* 1.26*  1.27*  MG 1.6*  < > 1.9  < > 1.9 1.9  --  1.9  PHOS 4.8*  < > 3.2  3.2  < > 2.7 2.5 2.7 2.5   ALBUMIN 1.9*  < > 1.9*  2.0*  < > 1.7*  --  2.0* 1.9*  PROT 4.5*  --  4.4*  --   --   --   --   --   AST 39  --  47*  --   --   --   --   --   ALT 26  --  29  --   --   --   --   --   ALKPHOS 82  --  83  --   --   --   --   --   BILITOT 2.9*  --  2.9*  --   --   --   --   --   BILIDIR  --   --  1.7*  --   --   --   --   --   IBILI  --   --  1.2*  --   --   --   --   --   < > = values in this interval not displayed. Estimated Creatinine Clearance: 62.5 mL/min (A) (by  C-G formula based on SCr of 1.26 mg/dL (H)).   Recent Labs  06/01/16 2346 06/02/16 0357 06/02/16 0739  GLUCAP 146* 142* 133*    Microbiology: Recent Results (from the past 720 hour(s))  Blood Culture (routine x 2)     Status: None   Collection Time: 05/12/16 11:57 AM  Result Value Ref Range Status   Specimen Description BLOOD RIGHT ASSIST CONTROL  Final   Special Requests   Final    BOTTLES DRAWN AEROBIC AND ANAEROBIC Blood Culture adequate volume   Culture NO GROWTH 5 DAYS  Final   Report Status 05/17/2016 FINAL  Final  Blood Culture (routine x 2)     Status: None   Collection Time: 05/12/16 11:57 AM  Result Value Ref Range Status   Specimen Description BLOOD LEFT ARM  Final   Special Requests   Final    BOTTLES DRAWN AEROBIC AND ANAEROBIC Blood Culture results may not be optimal due to an excessive volume of blood received in culture bottles   Culture NO GROWTH 5 DAYS  Final   Report Status 05/17/2016 FINAL  Final  Aerobic/Anaerobic Culture (surgical/deep wound)     Status: None   Collection Time: 05/13/16  8:38 AM  Result Value Ref Range Status   Specimen Description WOUND LEFT FOOT  Final   Special Requests NONE  Final   Gram Stain   Final    MODERATE WBC PRESENT, PREDOMINANTLY PMN ABUNDANT GRAM POSITIVE COCCI IN PAIRS IN CLUSTERS Performed at [facility], [address] [place], [place] [address]    Culture   Final    MODERATE STAPHYLOCOCCUS AUREUS ABUNDANT PEPTOSTREPTOCOCCUS SPECIES     Report Status 05/20/2016 FINAL  Final   Organism ID, Bacteria STAPHYLOCOCCUS AUREUS  Final      Susceptibility   Staphylococcus aureus - MIC*    CIPROFLOXACIN <=0.5 SENSITIVE Sensitive     ERYTHROMYCIN >=8 RESISTANT Resistant     GENTAMICIN <=0.5 SENSITIVE Sensitive     OXACILLIN <=0.25 SENSITIVE Sensitive     TETRACYCLINE <=1 SENSITIVE Sensitive     VANCOMYCIN <=0.5 SENSITIVE Sensitive     TRIMETH/SULFA <=10 SENSITIVE Sensitive     CLINDAMYCIN <=0.25 SENSITIVE Sensitive     RIFAMPIN <=0.5 SENSITIVE Sensitive     Inducible Clindamycin NEGATIVE Sensitive     * MODERATE STAPHYLOCOCCUS AUREUS  Urine culture     Status: None   Collection Time: 05/13/16  5:39 PM  Result Value Ref Range Status   Specimen Description URINE, CLEAN CATCH  Final   Special Requests NONE  Final   Culture   Final    NO GROWTH Performed at [facility], [address] [place], [place] [address]    Report Status 05/15/2016 FINAL  Final  MRSA PCR Screening     Status: None   Collection Time: 05/26/16  6:40 AM  Result Value Ref Range Status   MRSA by PCR NEGATIVE NEGATIVE Final    Comment:        The GeneXpert MRSA Assay (FDA approved for NASAL specimens only), is one component of a comprehensive MRSA colonization surveillance program. It is not intended to diagnose MRSA infection nor to guide or monitor treatment for MRSA infections.   C difficile quick scan w PCR reflex     Status: None   Collection Time: 05/26/16  1:30 PM  Result Value Ref Range Status   C Diff antigen NEGATIVE NEGATIVE Final   C Diff toxin NEGATIVE NEGATIVE Final  C Diff interpretation No C. difficile detected.  Final  Gastrointestinal Panel by PCR , Stool     Status: None   Collection Time: 05/26/16  6:19 PM  Result Value Ref Range Status   Campylobacter species NOT DETECTED NOT DETECTED Final   Plesimonas shigelloides NOT DETECTED NOT DETECTED Final   Salmonella species NOT DETECTED NOT DETECTED Final   Yersinia  enterocolitica NOT DETECTED NOT DETECTED Final   Vibrio species NOT DETECTED NOT DETECTED Final   Vibrio cholerae NOT DETECTED NOT DETECTED Final   Enteroaggregative E coli (EAEC) NOT DETECTED NOT DETECTED Final   Enteropathogenic E coli (EPEC) NOT DETECTED NOT DETECTED Final   Enterotoxigenic E coli (ETEC) NOT DETECTED NOT DETECTED Final   Shiga like toxin producing E coli (STEC) NOT DETECTED NOT DETECTED Final   Shigella/Enteroinvasive E coli (EIEC) NOT DETECTED NOT DETECTED Final   Cryptosporidium NOT DETECTED NOT DETECTED Final   Cyclospora cayetanensis NOT DETECTED NOT DETECTED Final   Entamoeba histolytica NOT DETECTED NOT DETECTED Final   Giardia lamblia NOT DETECTED NOT DETECTED Final   Adenovirus F40/41 NOT DETECTED NOT DETECTED Final   Astrovirus NOT DETECTED NOT DETECTED Final   Norovirus GI/GII NOT DETECTED NOT DETECTED Final   Rotavirus A NOT DETECTED NOT DETECTED Final   Sapovirus (I, II, IV, and V) NOT DETECTED NOT DETECTED Final  Blood Culture (routine x 2)     Status: None (Preliminary result)   Collection Time: 2016-06-10  3:03 PM  Result Value Ref Range Status   Specimen Description BLOOD L AC  Final   Special Requests BOTTLES DRAWN AEROBIC AND ANAEROBIC BCAV  Final   Culture NO GROWTH 3 DAYS  Final   Report Status PENDING  Incomplete  Blood Culture (routine x 2)     Status: None (Preliminary result)   Collection Time: 06/10/2016  4:42 PM  Result Value Ref Range Status   Specimen Description BLOOD RIGHT NECK  Final   Special Requests   Final    BOTTLES DRAWN AEROBIC AND ANAEROBIC Blood Culture results may not be optimal due to an excessive volume of blood received in culture bottles   Culture NO GROWTH 3 DAYS  Final   Report Status PENDING  Incomplete  MRSA PCR Screening     Status: None   Collection Time: 2016-06-10  8:35 PM  Result Value Ref Range Status   MRSA by PCR NEGATIVE NEGATIVE Final    Comment:        The GeneXpert MRSA Assay (FDA approved for NASAL  specimens only), is one component of a comprehensive MRSA colonization surveillance program. It is not intended to diagnose MRSA infection nor to guide or monitor treatment for MRSA infections.   Urine culture     Status: Abnormal   Collection Time: 05/30/16  1:51 AM  Result Value Ref Range Status   Specimen Description URINE, RANDOM  Final   Special Requests NONE  Final   Culture >=100,000 COLONIES/mL YEAST (A)  Final   Report Status 05/31/2016 FINAL  Final  Culture, respiratory (NON-Expectorated)     Status: None (Preliminary result)   Collection Time: 05/30/16  2:45 AM  Result Value Ref Range Status   Specimen Description TRACHEAL ASPIRATE  Final   Special Requests NONE  Final   Gram Stain   Final    FEW WBC PRESENT, PREDOMINANTLY MONONUCLEAR MODERATE YEAST FEW GRAM POSITIVE COCCI RARE GRAM VARIABLE ROD    Culture   Final    ABUNDANT YEAST RARE FUNGUS (  MOLD) ISOLATED, PROBABLE CONTAMINANT/COLONIZER (SAPROPHYTE). CONTACT MICROBIOLOGY IF FURTHER IDENTIFICATION REQUIRED [phone]. CULTURE REINCUBATED FOR BETTER GROWTH Performed at [facility], [address] [place], [place] [address]    Report Status PENDING  Incomplete  C difficile quick scan w PCR reflex     Status: None   Collection Time: 06/01/16 12:18 PM  Result Value Ref Range Status   C Diff antigen NEGATIVE NEGATIVE Final   C Diff toxin NEGATIVE NEGATIVE Final   C Diff interpretation No C. difficile detected.  Final    Medications:  Prescriptions Prior to Admission  Medication Sig Dispense Refill Last Dose  . apixaban (ELIQUIS) 5 MG TABS tablet Take 1 tablet (5 mg total) by mouth 2 (two) times daily. 60 tablet 0 05/23/2016 at am  . clindamycin (CLEOCIN) 300 MG capsule Take 1 capsule (300 mg total) by mouth 4 (four) times daily. 24 capsule 0 05/28/2016 at am  . digoxin (LANOXIN) 0.25 MG tablet Take 1 tablet (0.25 mg total) by mouth daily.   05/27/2016 at am  . diltiazem (CARDIZEM CD) 180 MG 24 hr  capsule Take 1 capsule (180 mg total) by mouth daily.   05/20/2016 at am  . furosemide (LASIX) 40 MG tablet Take 1 tablet (40 mg total) by mouth daily. 30 tablet  05/19/2016 at am  . HYDROcodone-acetaminophen (NORCO) 7.5-325 MG tablet Take 1 tablet by mouth every 6 (six) hours as needed for moderate pain. 30 tablet 0 prn at prn  . Insulin Glargine (LANTUS SOLOSTAR) 100 UNIT/ML Solostar Pen Inject 50 Units into the skin at bedtime. 15 mL 11 05/28/2016 at qhs  . levofloxacin (LEVAQUIN) 500 MG tablet Take 1 tablet (500 mg total) by mouth daily. 6 tablet 0 05/08/2016 at am  . loperamide (IMODIUM A-D) 2 MG tablet Take 2 mg by mouth every 8 (eight) hours as needed for diarrhea or loose stools.   prn at prn  . metoprolol (LOPRESSOR) 50 MG tablet Take 1 tablet (50 mg total) by mouth 2 (two) times daily. 180 tablet 3 05/23/2016 at am  . norethindrone (AYGESTIN) 5 MG tablet Take 1 tablet (5 mg total) by mouth 3 (three) times daily. 30 tablet 0 05/15/2016 at am  . nystatin (MYCOSTATIN/NYSTOP) powder Apply 1 g topically as needed (for rash and redness).   prn at prn  . ondansetron (ZOFRAN) 4 MG tablet Take 1 tablet (4 mg total) by mouth every 6 (six) hours as needed for nausea. 20 tablet 0 prn at prn  . rosuvastatin (CRESTOR) 10 MG tablet Take 1 tablet (10 mg total) by mouth daily after supper. 90 tablet 3 05/28/2016 at pm  . senna-docusate (SENOKOT-S) 8.6-50 MG tablet Take 1 tablet by mouth 2 (two) times daily.   05/08/2016 at am    Assessment: 5/24: K @ 18:00 = 3.3   Goal of Therapy:  Normalization of electrolytes   Plan:  KCl 10 mEq IV X 3 ordered to be given around 5/24 @ 22:00. Will recheck electrolytes on 5/25 @ 2:00.   5/25 @ 0256 K 3.6, Mg 1.9. No further supplementation warranted at this time. Will follow up w/ BMP during am labs.  5/26 0816:  K 4.0,  Mag 1.9  Phos 2.5. Electrolytes WNL, No supplementation needed. F/u with next labs (currently ordered Q6h)  Bari Mantis PharmD Clinical  Pharmacist 06/02/2016

## 2016-06-02 NOTE — Progress Notes (Signed)
While in patient room, observation of cardiac monitor appeared sustaining ventricular rhythm of VTach.  Patient sustained rhythm for approximately 2 minutes. Patient transitioned back to baseline rhythm of Afib. Notified E-Link of rhythm change via Elink button. Pola CornELink Nurse and MD camera-in. Received no new orders at this time. Patient vital signs stable. Still requiring two vasopressors. Will continue to monitor and assess patient.

## 2016-06-02 NOTE — Progress Notes (Addendum)
[facility] Critical Care Medicine Progess Note    SYNOPSIS  17 F with multiple severe medical problems recently discharged from [facility] to SNF and readmitted via ED with altered mental status and respiratory arrest. Clinical data and presentation consistent with severe sepsis/septic shock, unclear source   ASSESSMENT/PLAN   Ventilator dependent respiratory failure. Presently on pressure control ventilation with 5 of PEEP, set rate of 12 and FiO2 of 28%. We'll check a morning chest x-ray and arterial blood gas. We'll hold on spontaneous awakening and breathing trial after patient has her CT scan of abdomen and pelvis.  Septic shock. Presently on cefepime and doxycycline. CVP has been running 5-8. Patient has a warming anasarca hesitant to give more fluid resuscitation. Received albumin yesterday. We'll continue to wean norepinephrine as tolerated  Copious amounts of liquid stool. Some abdominal tenderness noted. Lactic acid has increased to 3.3. Will send for abdominal/pelvic CT.  Renal insufficiency. BUN/creatinine is 28/1.34. Presently on CRRT  Hyperglycemia. On coverage    Critical care time 45 minutes, respiratory failure, septic shock, change in abdominal exam  VENTILATOR SETTINGS: Vent Mode: PCV FiO2 (%):  [28 %-30 %] 28 % Set Rate:  [12 bmp] 12 bmp PEEP:  [5 cmH20] 5 cmH20 Pressure Support:  [8 cmH20] 8 cmH20  HEMODYNAMICS: CVP:  [5 mmHg-14 mmHg] 5 mmHg  INTAKE / OUTPUT:  Intake/Output Summary (Last 24 hours) at 06/02/16 0758 Last data filed at 06/02/16 0700  Gross per 24 hour  Intake          2596.31 ml  Output             2643 ml  Net           -46.69 ml    Name: Laura Kline MRN: [id number] DOB: [birth year]    ADMISSION DATE:  06-24-16  SUBJECTIVE: No significant change in last 24 hours. Patient has had copious amounts of stool liquidy, C. difficile studies are negative.  Pt currently on the ventilator, can not provide history or review of systems.     VITAL SIGNS: Temp:  [97.5 F (36.4 C)-98.6 F (37 C)] 98.1 F (36.7 C) (05/26 0720) Pulse Rate:  [98-125] 108 (05/26 0720) Resp:  [19-27] 25 (05/26 0720) BP: (65-129)/(21-90) 96/55 (05/26 0700) SpO2:  [99 %-100 %] 100 % (05/26 0720) FiO2 (%):  [28 %-30 %] 28 % (05/26 0720) Weight:  [145.6 kg (320 lb 15.8 oz)] 145.6 kg (320 lb 15.8 oz) (05/26 0400)   PHYSICAL EXAMINATION: Physical Examination:   VS: BP (!) 96/55   Pulse (!) 108   Temp 98.1 F (36.7 C) (Oral)   Resp (!) 25   Ht 5' (1.524 m)   Wt (!) 145.6 kg (320 lb 15.8 oz)   SpO2 100%   BMI 62.69 kg/m   General Appearance: Sedated, orally intubated, on CRRT, left subclavian line in place, on vasopressin at 0.03 and norepinephrine at 12 g Pulmonary: Coarse breath sounds appreciated with basilar crackles noted  CardiovascularNormal S1,S2.  No m/r/g.   Abdomen: Positive bowel sounds appreciated, mild tenderness with deep palpation noted diffusely Skin:   Lower extremity edema is appreciated with resolving cellulitis in the left lower family, left foot ulcer bandaged and dressed Extremities: Anasarca noted with diffuse edema    LABORATORY PANEL:   CBC  Recent Labs Lab 06/01/16 0256  WBC 4.8  HGB 11.3*  HCT 32.4*  PLT 110*    Chemistries   Recent Labs Lab 06/01/16 0256  06/02/16 0455 06/02/16 0456  NA 135  135  < >  --  139  K 3.6  3.6  < >  --  3.9  CL 104  104  < >  --  107  CO2 26  25  < >  --  25  GLUCOSE 160*  161*  < >  --  151*  BUN 36*  36*  < >  --  28*  CREATININE 1.75*  1.79*  < >  --  1.34*  CALCIUM 8.4*  8.3*  < >  --  8.5*  MG 1.9  < > 1.9  --   PHOS 3.2  3.2  < > 2.5 2.7  AST 47*  --   --   --   ALT 29  --   --   --   ALKPHOS 83  --   --   --   BILITOT 2.9*  --   --   --   < > = values in this interval not displayed.   Recent Labs Lab 06/01/16 1203 06/01/16 1623 06/01/16 2005 06/01/16 2346 06/02/16 0357 06/02/16 0739  GLUCAP 137* 151* 137* 146* 142* 133*     Recent Labs Lab 05/30/16 0152 05/30/16 0431 06/02/16 0320  PHART 7.11* 7.21* 7.50*  PCO2ART 44 47 28*  PO2ART 80* 107 121*    Recent Labs Lab 05/30/16 0529  05/31/16 0541  06/01/16 0256  06/01/16 2010 06/02/16 0049 06/02/16 0456  AST 44*  --  39  --  47*  --   --   --   --   ALT 29  --  26  --  29  --   --   --   --   ALKPHOS 81  --  82  --  83  --   --   --   --   BILITOT 4.0*  --  2.9*  --  2.9*  --   --   --   --   ALBUMIN 1.8*  < > 1.9*  < > 1.9*  2.0*  < > 1.8* 1.7* 2.0*  < > = values in this interval not displayed.  Cardiac Enzymes  Recent Labs Lab 03/16/16 1503  TROPONINI 0.04*    RADIOLOGY:  Dg Chest Port 1 View  Result Date: 06/01/2016 CLINICAL DATA:  Respiratory failure . EXAM: PORTABLE CHEST 1 VIEW COMPARISON:  05/31/2016 . FINDINGS: Endotracheal tube 1.5 cm above the lower portion of the carina. Proximal repositioning of approximately 2 cm suggested. NG tube and bilateral central lines in stable position. Heart size stable. Low lung volumes with mild basilar atelectasis. No pneumothorax . IMPRESSION: 1. Endotracheal tube 1.5 cm above the lower portion of the carina. Proximal repositioning of approximately 2 cm should be considered. NG tube and bilateral central lines in stable position. 2. Low lung volumes with mild bibasilar atelectasis. These results will be called to the ordering clinician or representative by the Radiologist Assistant, and communication documented in the PACS or zVision Dashboard. Electronically Signed   By: Maisie Fushomas  Register   On: 06/01/2016 06:58      Tora KindredJohn Lastacia Solum, DO [facility] Office Number: [phone]   06/02/2016

## 2016-06-02 NOTE — Progress Notes (Signed)
Pharmacy Antibiotic Note/CRRT Medication Adjusment  Laura Kline is a 61 y.o. female with a recent I&D of left foot abscess admitted on 05/20/2016 with sepsis.  Pharmacy has been consulted for vancomycin and cefepime and Fluconazole dosing.   Patient is beginning CRRT for acute renal failure. Patient is also ordered doxycycline and metronidazole per ID.   Vancomycin d/c 5/25 per ID.  Plan: 1. ABX: Will continue cefepime 2 g iv q 12 hours for CRRT Will continue Fluconazole 400 mg IV Q24h  2. Aside from antibiotics, no medications require adjustment for CRRT at present.      Height: 5' (152.4 cm) Weight: (!) 320 lb 15.8 oz (145.6 kg) IBW/kg (Calculated) : 45.5  Temp (24hrs), Avg:98.1 F (36.7 C), Min:97.5 F (36.4 C), Max:98.6 F (37 C)   Recent Labs Lab 06/03/2016 1503 05/16/2016 1900  05/30/16 0529  05/31/16 0541  06/01/16 0256  06/01/16 1630 06/01/16 2010 06/02/16 0049 06/02/16 0430 06/02/16 0456 06/02/16 0816  WBC 19.8*  --   --  10.3  --  9.3  --  4.8  --   --   --   --   --   --  4.5  CREATININE 3.42*  --   < > 3.07*  < > 2.55*  < > 1.75*  1.79*  < > 1.54* 1.46* 1.37*  --  1.34* 1.26*  1.27*  LATICACIDVEN 4.8* 2.1*  --   --   --   --   --   --   --   --   --   --  3.3*  --   --   VANCORANDOM  --   --   --   --   --   --   --   --   --   --  10  --   --   --   --   < > = values in this interval not displayed.  Estimated Creatinine Clearance: 62.5 mL/min (A) (by C-G formula based on SCr of 1.26 mg/dL (H)).    Allergies  Allergen Reactions  . Rofecoxib Other (See Comments)    Reaction: unknown  . Penicillins Rash and Other (See Comments)    Has patient had a PCN reaction causing immediate rash, facial/tongue/throat swelling, SOB or lightheadedness with hypotension: Unknown Has patient had a PCN reaction causing severe rash involving mucus membranes or skin necrosis: Unknown Has patient had a PCN reaction that required hospitalization: Unknown Has patient  had a PCN reaction occurring within the last 10 years: Unknown If all of the above answers are "NO", then may proceed with Cephalosporin use.     Antimicrobials this admission: vanc 5/22 >>  levofloxacin 5/22 >> 5/23 Cefepime 5/22 >> Metronidazole 5/23 >> Doxycycline >>  Dose adjustments this admission:  Microbiology results: BCx: NGTD TA: abundant yeast, rare mold UCx: yeast MRSA PCR: negative C diff: sent GI PCR: sent  Thank you for allowing pharmacy to be a part of this patient's care.  Marchel Foote A Clinical Pharmacist  06/02/2016 10:48 AM

## 2016-06-02 NOTE — Progress Notes (Signed)
Wake-up assessment deferred per MD Conforti at bedside. No plan to extubate today, patient remains on lowest dose of Fentanyl possible to remain pain free. Will continue to monitor patient.

## 2016-06-02 NOTE — Progress Notes (Signed)
Notified MD Conforti that OG residuals were greater than 500. Ordered to stop tube feedings at this time. Will continue to monitor patient.

## 2016-06-02 NOTE — Consult Note (Signed)
[facility] Cardiology  CARDIOLOGY CONSULT NOTE  Patient ID: Laura Kline MRN: [id number] DOB/AGE: 07/27/1955 61 y.o.  Admit date: 05/28/2016 Referring Physician [facility] Primary Physician  Primary Cardiologist  Reason for Consultation Wide-complex tachycardia  HPI: 61 year old female referred for evaluation of nonsustained wide complex tachycardia. The patient was admitted on 05/14/2016 with left lower extremity cellulitis, and septic shock. The patient's had a complicated hospital course, with ventilator-dependent respiratory failure, septic shock on pressor therapy, acute on chronic renal failure. The patient has known history of chronic atrial fibrillation, on Eliquis for stroke prevention. The patient remains in atrial fibrillation with variable rate. Last evening, patient had 9 beat run of wide complex tachycardia, with QRS interval less than 0.12 seconds, possible nonsustained ventricular tachycardia  versus atrial fibrillation with aberrancy.  Review of systems complete and found to be negative unless listed above     Past Medical History:  Diagnosis Date  . Coronary atherosclerosis    LAD calcifications noted on CT scan in 2016 with negative nuclear stress test.  . DKA (diabetic ketoacidoses) (HCC)    a. 05/2009  . Essential hypertension   . History of Bell's palsy   . Hyperlipidemia   . Hypertension   . Hypokalemia   . IDDM (insulin dependent diabetes mellitus) (HCC)   . Long-term insulin use (HCC)   . Morbid obesity (HCC)   . Peripheral vascular disease [facility])     Past Surgical History:  Procedure Laterality Date  . CATARACT EXTRACTION W/PHACO Left 02/08/2015   Procedure: CATARACT EXTRACTION PHACO AND INTRAOCULAR LENS PLACEMENT (IOC);  Surgeon: Galen Manila, MD;  Location: [facility] ORS;  Service: Ophthalmology;  Laterality: Left;  [place] 01:33 AP% 15.9 CDE 14.94 flyuid pack lot # [id number]  . CESAREAN SECTION     x2  . EYE SURGERY Right    Cataract Extraction with IOL  .  FOREIGN BODY REMOVAL Left 05/13/2016   Procedure: REMOVAL FOREIGN BODY EXTREMITY;  Surgeon: Recardo Evangelist, DPM;  Location: [facility] ORS;  Service: Podiatry;  Laterality: Left;  . INCISION AND DRAINAGE Left 05/13/2016   Procedure: INCISION AND DRAINAGE;  Surgeon: Recardo Evangelist, DPM;  Location: [facility] ORS;  Service: Podiatry;  Laterality: Left;    Prescriptions Prior to Admission  Medication Sig Dispense Refill Last Dose  . apixaban (ELIQUIS) 5 MG TABS tablet Take 1 tablet (5 mg total) by mouth 2 (two) times daily. 60 tablet 0 05/30/2016 at am  . clindamycin (CLEOCIN) 300 MG capsule Take 1 capsule (300 mg total) by mouth 4 (four) times daily. 24 capsule 0 05/21/2016 at am  . digoxin (LANOXIN) 0.25 MG tablet Take 1 tablet (0.25 mg total) by mouth daily.   06/04/2016 at am  . diltiazem (CARDIZEM CD) 180 MG 24 hr capsule Take 1 capsule (180 mg total) by mouth daily.   05/21/2016 at am  . furosemide (LASIX) 40 MG tablet Take 1 tablet (40 mg total) by mouth daily. 30 tablet  06/04/2016 at am  . HYDROcodone-acetaminophen (NORCO) 7.5-325 MG tablet Take 1 tablet by mouth every 6 (six) hours as needed for moderate pain. 30 tablet 0 prn at prn  . Insulin Glargine (LANTUS SOLOSTAR) 100 UNIT/ML Solostar Pen Inject 50 Units into the skin at bedtime. 15 mL 11 05/28/2016 at qhs  . levofloxacin (LEVAQUIN) 500 MG tablet Take 1 tablet (500 mg total) by mouth daily. 6 tablet 0 05/16/2016 at am  . loperamide (IMODIUM A-D) 2 MG tablet Take 2 mg by mouth every 8 (eight) hours as needed for  diarrhea or loose stools.   prn at prn  . metoprolol (LOPRESSOR) 50 MG tablet Take 1 tablet (50 mg total) by mouth 2 (two) times daily. 180 tablet 3 05/23/2016 at am  . norethindrone (AYGESTIN) 5 MG tablet Take 1 tablet (5 mg total) by mouth 3 (three) times daily. 30 tablet 0 05/30/2016 at am  . nystatin (MYCOSTATIN/NYSTOP) powder Apply 1 g topically as needed (for rash and redness).   prn at prn  . ondansetron (ZOFRAN) 4 MG tablet Take 1 tablet (4  mg total) by mouth every 6 (six) hours as needed for nausea. 20 tablet 0 prn at prn  . rosuvastatin (CRESTOR) 10 MG tablet Take 1 tablet (10 mg total) by mouth daily after supper. 90 tablet 3 05/28/2016 at pm  . senna-docusate (SENOKOT-S) 8.6-50 MG tablet Take 1 tablet by mouth 2 (two) times daily.   05/13/2016 at am   Social History   Social History  . Marital status: Married    Spouse name: N/A  . Number of children: N/A  . Years of education: N/A   Occupational History  . Not on file.   Social History Main Topics  . Smoking status: Never Smoker  . Smokeless tobacco: Never Used  . Alcohol use No  . Drug use: No  . Sexual activity: Yes   Other Topics Concern  . Not on file   Social History Narrative  . No narrative on file    Family History  Problem Relation Age of Onset  . Hypertension Mother   . Hypertension Father   . CAD Father        s/p cabg  . Heart attack Father   . Heart failure Father       Review of systems complete and found to be negative unless listed above      PHYSICAL EXAM  General: Well developed, well nourished, in no acute distress HEENT:  Normocephalic and atramatic Neck:  No JVD.  Lungs: Clear bilaterally to auscultation and percussion. Heart: HRRR . Normal S1 and S2 without gallops or murmurs.  Abdomen: Bowel sounds are positive, abdomen soft and non-tender  Msk:  Back normal, normal gait. Normal strength and tone for age. Extremities: No clubbing, cyanosis or edema.   Neuro: Alert and oriented X 3. Psych:  Good affect, responds appropriately  Labs:   Lab Results  Component Value Date   WBC 4.5 06/02/2016   HGB 10.5 (L) 06/02/2016   HCT 30.6 (L) 06/02/2016   MCV 90.3 06/02/2016   PLT 68 (L) 06/02/2016    Recent Labs Lab 06/01/16 0256  06/02/16 0816  NA 135  135  < > 136  137  K 3.6  3.6  < > 4.0  4.0  CL 104  104  < > 104  105  CO2 26  25  < > 24  24  BUN 36*  36*  < > 27*  28*  CREATININE 1.75*  1.79*  < >  1.26*  1.27*  CALCIUM 8.4*  8.3*  < > 8.5*  8.5*  PROT 4.4*  --   --   BILITOT 2.9*  --   --   ALKPHOS 83  --   --   ALT 29  --   --   AST 47*  --   --   GLUCOSE 160*  161*  < > 149*  150*  < > = values in this interval not displayed. Lab Results  Component Value Date  TROPONINI 0.04 (HH) 05/18/2016    Lab Results  Component Value Date   CHOL 214 (H) 09/02/2014   CHOL 156 07/27/2014   Lab Results  Component Value Date   HDL 41 09/02/2014   HDL 32 (L) 07/27/2014   Lab Results  Component Value Date   LDLCALC 125 (H) 09/02/2014   LDLCALC 82 07/27/2014   Lab Results  Component Value Date   TRIG 242 (H) 09/02/2014   TRIG 208 (H) 07/27/2014   Lab Results  Component Value Date   CHOLHDL 5.2 (H) 09/02/2014   CHOLHDL 4.9 07/27/2014   No results found for: LDLDIRECT    Radiology: Ct Abdomen Pelvis Wo Contrast  Result Date: 06/02/2016 CLINICAL DATA:  Generalized abdominal pain. EXAM: CT ABDOMEN AND PELVIS WITHOUT CONTRAST TECHNIQUE: Multidetector CT imaging of the abdomen and pelvis was performed following the standard protocol without IV contrast. COMPARISON:  CT scan of May 29, 2016. FINDINGS: Lower chest: Minimal bilateral posterior basilar subsegmental atelectasis. Hepatobiliary: No focal abnormality is seen in the liver on these unenhanced images. Minimal cholelithiasis is noted. Pancreas: Unremarkable. No pancreatic ductal dilatation or surrounding inflammatory changes. Spleen: Normal in size without focal abnormality. Adrenals/Urinary Tract: Adrenal glands appear normal. No hydronephrosis or renal obstruction is noted. No renal or ureteral calculi are noted. Urinary bladder is decompressed secondary to Foley catheter. Stomach/Bowel: Distal tip of nasogastric tube is seen in distal stomach. Mild gastric distention is noted. Small bowel dilatation is noted with nondilated terminal ileum, but transition zone is not clearly identified. The appendix appears normal. Rectal tube  is noted. Vascular/Lymphatic: Aortic atherosclerosis. No enlarged abdominal or pelvic lymph nodes. Reproductive: Uterus and bilateral adnexa are unremarkable. Other: No abdominal wall hernia or abnormality. No abdominopelvic ascites. Musculoskeletal: Multilevel degenerative disc disease is noted in the lower lumbar spine. IMPRESSION: Minimal cholelithiasis. Aortic atherosclerosis. Distal tip of nasogastric tube is seen in dilated distal stomach. Small bowel dilatation is noted with nondilated terminal ileum, but transition zone is not clearly identified. This is concerning for partial small bowel obstruction. Electronically Signed   By: Lupita Raider, M.D.   On: 06/02/2016 10:41   Ct Abdomen Pelvis Wo Contrast  Result Date: 05/13/2016 CLINICAL DATA:  Unresponsive.  Septic shock. EXAM: CT ABDOMEN AND PELVIS WITHOUT CONTRAST TECHNIQUE: Multidetector CT imaging of the abdomen and pelvis was performed following the standard protocol without IV contrast. COMPARISON:  Right upper quadrant and pelvic ultrasounds 05/26/2016 FINDINGS: Lower chest: Partially visualized right lower lobe consolidation with more patchy opacities elsewhere in both lower lobes more inferiorly. Likely trace bilateral pleural effusions, with evaluation limited by streak artifact. Partially visualize coronary artery atherosclerosis. Hepatobiliary: No focal liver abnormality is seen. Tiny stones layer in the gallbladder without evidence of wall thickening or pericholecystic inflammatory change. No biliary dilatation. Pancreas: Mild diffuse atrophy. Punctate calcifications at the level of the inferior pancreatic head which may be parenchymal (such as from past pancreatitis) or vascular. No pancreatic or biliary ductal dilatation to strongly suggest that these are ductal calculi. No peripancreatic inflammation. Spleen: Unremarkable. Adrenals/Urinary Tract: Unremarkable adrenal glands. No evidence of renal mass, calculi, or hydronephrosis. Bladder  decompressed by a Foley catheter. Stomach/Bowel: Enteric tube terminates in the distal stomach. A moderate amount of fluid is present in the stomach. Fluid is present in scattered loops of nondilated small and large bowel including in the rectum. There is no evidence of bowel obstruction, and no gross bowel wall thickening is identified. The appendix is unremarkable. Vascular/Lymphatic: Abdominal aortic atherosclerosis  without aneurysm. No enlarged lymph nodes. Reproductive: Small calcification in the uterine fundus, otherwise unremarkable appearance of the uterus and ovaries. Other: No intraperitoneal free fluid. Small fat containing umbilical hernia. Abdominal wall scarring in the midline inferior to the umbilicus. Musculoskeletal: Advanced thoracic and lumbar spine disc degeneration and advanced lumbar facet arthropathy. IMPRESSION: 1. Bibasilar pulmonary airspace opacities including partially visualized right lower lobe consolidation which may reflect pneumonia. 2. No definite acute abnormality identified in the abdomen or pelvis. 3. Cholelithiasis. 4. Other incidental findings as above. 5.  Aortic Atherosclerosis (ICD10-I70.0). Electronically Signed   By: Sebastian Ache M.D.   On: 06/05/2016 18:03   Dg Chest 1 View  Result Date: 06/05/2016 CLINICAL DATA:  Central line EXAM: CHEST 1 VIEW COMPARISON:  05/21/2016 FINDINGS: Repositioned endotracheal tube, the tip is approximately 4.2 cm superior to the carina. Esophageal tube tip is below the diaphragm. Right IJ central venous catheter tip faintly visualized over the proximal right atrium. No pneumothorax. Cardiomegaly with mild central congestion. Patchy perihilar and basilar opacity. IMPRESSION: 1. Repositioning of endotracheal tube 2. Right IJ central venous catheter tip overlies proximal right atrium, no pneumothorax 3. Low lung volumes. Slight increased patchy perihilar opacity compared to prior which may reflect increasing atelectasis or vascular  congestion. Electronically Signed   By: Jasmine Pang M.D.   On: 06/02/2016 17:09   Ct Head Wo Contrast  Result Date: 05/09/2016 CLINICAL DATA:  Unresponsive EXAM: CT HEAD WITHOUT CONTRAST TECHNIQUE: Contiguous axial images were obtained from the base of the skull through the vertex without intravenous contrast. COMPARISON:  None. FINDINGS: Brain: No acute territorial infarction, hemorrhage or intracranial mass is seen. Prominent extra-axial CSF spaces anteriorly likely related to atrophy. Ventricles are nonenlarged. Vascular: No hyperdense vessels. Carotid artery calcification and vertebral artery calcification. Skull: No fracture or suspicious bone lesion Sinuses/Orbits: Mucosal thickening in the maxillary and ethmoid sinuses. No acute orbital abnormality. Bilateral lens extraction. Other: None IMPRESSION: No CT evidence for acute intracranial abnormality. Electronically Signed   By: Jasmine Pang M.D.   On: 06/01/2016 17:52   US Transvaginal Non-ob  Result Date: 05/26/2016 CLINICAL DATA:  Postmenopausal bleeding.  Patient not on tamoxifen. EXAM: TRANSABDOMINAL AND TRANSVAGINAL ULTRASOUND OF PELVIS TECHNIQUE: Both transabdominal and transvaginal ultrasound examinations of the pelvis were performed. Transabdominal technique was performed for global imaging of the pelvis including uterus, ovaries, adnexal regions, and pelvic cul-de-sac. It was necessary to proceed with endovaginal exam following the transabdominal exam to visualize the uterus and ovaries. COMPARISON:  None FINDINGS: The study was limited by body habitus. Uterus Measurements: 8.4 x 4.2 x 4.7 cm. The uterus is anteverted in appearance. No fibroids or other mass visualized. Endometrium The margins of the endometrial cavity are not well visualized and inhomogeneous. What appears to be the endometrial stripe is 15 mm in thickness. Right ovary No adnexal mass.  The right ovary was not visualized. Left ovary No adnexal mass.  The left ovary was not  visualized. Other findings No abnormal free fluid. IMPRESSION: Neither ovary was visualized.  No adnexal mass however is seen. Inhomogeneous appearance of the endometrial stripe. What appears to be the endometrial lining measures 15 mm in thickness which is above normal for age. Differential considerations for this appearance would include neoplasia, hyperplasia or polyp. Electronically Signed   By: Tollie Eth M.D.   On: 05/26/2016 02:35   US Pelvis Complete  Result Date: 05/26/2016 CLINICAL DATA:  Postmenopausal bleeding.  Patient not on tamoxifen. EXAM: TRANSABDOMINAL AND TRANSVAGINAL ULTRASOUND OF  PELVIS TECHNIQUE: Both transabdominal and transvaginal ultrasound examinations of the pelvis were performed. Transabdominal technique was performed for global imaging of the pelvis including uterus, ovaries, adnexal regions, and pelvic cul-de-sac. It was necessary to proceed with endovaginal exam following the transabdominal exam to visualize the uterus and ovaries. COMPARISON:  None FINDINGS: The study was limited by body habitus. Uterus Measurements: 8.4 x 4.2 x 4.7 cm. The uterus is anteverted in appearance. No fibroids or other mass visualized. Endometrium The margins of the endometrial cavity are not well visualized and inhomogeneous. What appears to be the endometrial stripe is 15 mm in thickness. Right ovary No adnexal mass.  The right ovary was not visualized. Left ovary No adnexal mass.  The left ovary was not visualized. Other findings No abnormal free fluid. IMPRESSION: Neither ovary was visualized.  No adnexal mass however is seen. Inhomogeneous appearance of the endometrial stripe. What appears to be the endometrial lining measures 15 mm in thickness which is above normal for age. Differential considerations for this appearance would include neoplasia, hyperplasia or polyp. Electronically Signed   By: Tollie Eth M.D.   On: 05/26/2016 02:35   US Renal  Result Date: 05/30/2016 CLINICAL DATA:  Acute  renal insufficiency. EXAM: RENAL / URINARY TRACT ULTRASOUND COMPLETE COMPARISON:  CT scan 06/03/2016 FINDINGS: Right Kidney: Length: 10.8 cm. Mild renal cortical thinning but normal echogenicity. No focal lesions, renal calculi or hydronephrosis. Left Kidney: Length: 13.3 cm. Normal renal cortical thickness and echogenicity. No focal lesions, renal calculi or hydronephrosis. Bladder: Decompressed by a Foley catheter. Other: Cholelithiasis. IMPRESSION: Mild renal cortical thinning of the right kidney but normal echogenicity. No hydronephrosis. Electronically Signed   By: Rudie Meyer M.D.   On: 05/30/2016 10:40   Mr Foot Left W Wo Contrast  Result Date: 05/13/2016 CLINICAL DATA:  Increasing left foot pain and erythema over the last week. Cellulitis with foreign body and suspected sepsis. EXAM: MRI OF THE LEFT FOREFOOT WITHOUT AND WITH CONTRAST TECHNIQUE: Multiplanar, multisequence MR imaging of the left forefoot was performed both before and after administration of intravenous contrast. CONTRAST:  20mL MULTIHANCE GADOBENATE DIMEGLUMINE 529 MG/ML IV SOLN COMPARISON:  Radiographs 05/12/2016. FINDINGS: Bones/Joint/Cartilage The metallic foreign body within the first web states creates significant magnetic susceptibility artifact, limiting evaluation of the first and second digits. Allowing for this artifact, there is no abnormal marrow signal or enhancement. There is no evidence of cortical destruction, acute fracture or dislocation. No significant joint effusions or abnormal synovial enhancement demonstrated. Ligaments The Lisfranc ligament is intact. 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 There is mild T2 hyperintensity along the flexor tendons. No focal intramuscular fluid collections or suspicious enhancement seen. Soft tissues There is dorsal subcutaneous edema. There is an ill-defined fluid collection dorsal to the second and third  metatarsal heads, measuring up to 2.3 cm in greatest dimension. This demonstrates no peripheral enhancement or overlying skin defect. No other focal fluid collections are seen. IMPRESSION: 1. Findings are consistent with dorsal forefoot cellulitis. There is a nonspecific small fluid collection within the dorsal subcutaneous fat without apparent overlying skin defect. 2. No evidence of deep abscess, osteomyelitis or septic joint. 3. Metallic foreign body within the first web space, creating moderate susceptibility artifact. Electronically Signed   By: Carey Bullocks M.D.   On: 05/13/2016 09:14   Dg Chest Port 1 View  Result Date: 06/02/2016 CLINICAL DATA:  Sepsis. EXAM: PORTABLE CHEST 1 VIEW COMPARISON:  Radiograph of Jun 01, 2016. FINDINGS: Stable cardiomediastinal silhouette. Endotracheal and nasogastric tubes are unchanged in position. Right internal jugular and left subclavian catheters are also unchanged in position. No pneumothorax or pleural effusion is noted. No acute pulmonary disease is noted. Bony thorax is unremarkable. IMPRESSION: Stable support apparatus. No acute cardiopulmonary abnormality seen. Electronically Signed   By: Lupita Raider, M.D.   On: 06/02/2016 09:11   Dg Chest Port 1 View  Result Date: 06/01/2016 CLINICAL DATA:  Respiratory failure . EXAM: PORTABLE CHEST 1 VIEW COMPARISON:  05/31/2016 . FINDINGS: Endotracheal tube 1.5 cm above the lower portion of the carina. Proximal repositioning of approximately 2 cm suggested. NG tube and bilateral central lines in stable position. Heart size stable. Low lung volumes with mild basilar atelectasis. No pneumothorax . IMPRESSION: 1. Endotracheal tube 1.5 cm above the lower portion of the carina. Proximal repositioning of approximately 2 cm should be considered. NG tube and bilateral central lines in stable position. 2. Low lung volumes with mild bibasilar atelectasis. These results will be called to the ordering clinician or representative  by the Radiologist Assistant, and communication documented in the PACS or zVision Dashboard. Electronically Signed   By: Maisie Fus  Register   On: 06/01/2016 06:58   Dg Chest Port 1 View  Result Date: 05/31/2016 CLINICAL DATA:  Respiratory failure. EXAM: PORTABLE CHEST 1 VIEW COMPARISON:  05/30/2016. FINDINGS: Endotracheal tube, NG tube, right IJ line, left subclavian line in stable position. Heart size stable. Previously identified linear lucency over the right chest no longer identified. Low lung volumes with basilar atelectasis. IMPRESSION: 1. Lines and tubes in stable position.  No pneumothorax. 2. Low lung volumes with basilar atelectasis. Chest is stable from prior exam. Electronically Signed   By: Maisie Fus  Register   On: 05/31/2016 06:38   Dg Chest Port 1 View  Result Date: 05/30/2016 CLINICAL DATA:  61 year old female undergoing right IJ catheter exchange EXAM: PORTABLE CHEST 1 VIEW COMPARISON:  Chest x-ray obtained earlier today at 16:11 p.m. FINDINGS: The right IJ central venous catheter has been exchanged for a non tunneled hemodialysis catheter. The catheter tip projects over the superior cavoatrial junction. Stable position of left subclavian approach central venous catheter with the tip also at the superior cavoatrial junction. The patient remains intubated. The tip of the endotracheal tube is 2.5 cm above the carina. A gastric tube is present, the tip lies off the field of view below the diaphragm presumably within the stomach. Stable cardiac and mediastinal contours. A linear vertically oriented lucency in the right mid to upper lung remains present and unchanged. This is of indeterminate clinical significance. Respiratory volumes remain very low and there is bibasilar atelectasis. No overt pulmonary edema. No acute osseous abnormality. IMPRESSION: 1. The right IJ central venous catheter has been exchanged for a non tunneled hemodialysis catheter the tip of which projects over the superior  cavoatrial junction. 2. Other support apparatus in stable and satisfactory position. 3. No significant interval change in the appearance of the lungs with persistent very low volumes and bibasilar atelectasis. Electronically Signed   By: Malachy Moan M.D.   On: 05/30/2016 17:18   Dg Chest Port 1 View  Result Date: 05/30/2016 CLINICAL DATA:  Status post central line placement EXAM: PORTABLE CHEST 1 VIEW COMPARISON:  05/11/2016 FINDINGS: Cardiac shadow is stable. Endotracheal tube and nasogastric catheter are again seen and stable. Right jugular central line is again seen and stable. A new left subclavian central line is noted with the  catheter tip in the proximal superior vena cava. No pneumothorax is noted. No bony abnormality is seen. IMPRESSION: Status post left central venous line without pneumothorax. The remainder the exam is stable from the prior study. Electronically Signed   By: Alcide Clever M.D.   On: 05/30/2016 16:27   Dg Chest Port 1 View  Result Date: 05/28/2016 CLINICAL DATA:  ETT placement EXAM: PORTABLE CHEST 1 VIEW COMPARISON:  05/15/2016 FINDINGS: Endotracheal tube tip encroaches right mainstem bronchus orifice. Esophageal tube tip is below the diaphragm but not well visualized. Low lung volumes with subsegmental atelectasis at both bases. Borderline to mild cardiomegaly. No edema or infiltrate. IMPRESSION: Endotracheal tube tip encroaches the right mainstem bronchus orifice. Low lung volumes with subsegmental atelectasis at the bases. Electronically Signed   By: Jasmine Pang M.D.   On: 06/02/2016 15:59   Dg Chest Port 1 View  Result Date: 05/15/2016 CLINICAL DATA:  Status post PICC line placement on the right EXAM: PORTABLE CHEST 1 VIEW COMPARISON:  07/26/2014 FINDINGS: Cardiac shadow is stable. Lungs are hypoinflated but clear. A new right-sided PICC line is noted with catheter tip at the cavoatrial junction in satisfactory position. No bony abnormality is noted. IMPRESSION:  Status post PICC line placement in satisfactory position. Electronically Signed   By: Alcide Clever M.D.   On: 05/15/2016 12:40   Dg Abd Portable 1 View  Result Date: 06/04/2016 CLINICAL DATA:  OG tube placement EXAM: PORTABLE ABDOMEN - 1 VIEW COMPARISON:  None. FINDINGS: Orogastric tube with the tip projecting over the antrum of the stomach. There is no bowel dilatation to suggest obstruction. There is no evidence of pneumoperitoneum, portal venous gas or pneumatosis. There are no pathologic calcifications along the expected course of the ureters. The osseous structures are unremarkable. IMPRESSION: Orogastric tube with the tip projecting over the antrum of the stomach. Electronically Signed   By: Elige Ko   On: 05/22/2016 15:59   Dg Foot Complete Left  Result Date: 05/12/2016 CLINICAL DATA:  Left foot swelling. EXAM: LEFT FOOT - COMPLETE 3+ VIEW COMPARISON:  None. FINDINGS: There is no evidence of fracture or dislocation. There is no evidence of arthropathy. Spurring of posterior calcaneus is noted. Dorsal soft tissue swelling is noted concerning for cellulitis. Foreign body is seen in plantar soft tissues adjacent to second proximal phalanx. IMPRESSION: Radiopaque foreign body is noted in second toe. Dorsal soft tissue swelling is noted consistent with cellulitis. No fracture or dislocation is noted. Electronically Signed   By: Lupita Raider, M.D.   On: 05/12/2016 12:20   US Abdomen Limited Ruq  Result Date: 05/26/2016 CLINICAL DATA:  Right upper quadrant pain. EXAM: US ABDOMEN LIMITED - RIGHT UPPER QUADRANT COMPARISON:  None. FINDINGS: Gallbladder: Physiologically distended. Multiple gallstones largest measuring 5 mm. No gallbladder wall thickening, wall thickness of 2.5 mm. No pericholecystic fluid. No sonographic Aranas sign noted by sonographer. Common bile duct: Diameter: 3.6 mm, normal. Liver: No focal lesion identified. Mild diffuse increase in parenchymal echogenicity. Normal directional  flow in the main portal vein. IMPRESSION: 1. Cholelithiasis without sonographic findings of acute cholecystitis. 2. Mild hepatic steatosis. Electronically Signed   By: Rubye Oaks M.D.   On: 05/26/2016 01:40    EKG: Atrial fibrillation  ASSESSMENT AND PLAN:   1. Nonsustained, 9 beat run of wide complex tachycardia, atrial fibrillation with aberrancy versus ventricular tachycardia, of uncertain clinical significance in patient with multiorgan failure 2. Chronic atrial fibrillation, with variable ventricular rate 3. Septic shock 4. Respiratory  failure  Recommendations  1. Continue current therapy 2. No definitive therapy at this time 3. If patient develops recurrent episodes of clinical significant ventricular tachycardia, then would start amiodarone drip 4. No further cardiac diagnostics at this time  Signed: Marcina Millardlexander Jedadiah Abdallah MD,PhD, [facility] Center[facility]ACC 06/02/2016, 2:19 PM

## 2016-06-02 NOTE — Progress Notes (Signed)
Pt had a 13 beat wide complex run of SVT.  Elink notified and made aware.  No new orders at this time.  Will continue to monitor.

## 2016-06-02 NOTE — Progress Notes (Signed)
Notified NP of 10 beat run of VTach and intermittent tachycardia. Received new orders. Patient still requiring Levophed and Vasopressin for blood pressure support. Will continue to monitor and assess the patient.

## 2016-06-03 ENCOUNTER — Inpatient Hospital Stay: Payer: BLUE CROSS/BLUE SHIELD

## 2016-06-03 DIAGNOSIS — L8990 Pressure ulcer of unspecified site, unspecified stage: Secondary | ICD-10-CM | POA: Insufficient documentation

## 2016-06-03 LAB — HEPATIC FUNCTION PANEL
ALBUMIN: 2.8 g/dL — AB (ref 3.5–5.0)
ALK PHOS: 78 U/L (ref 38–126)
ALT: 20 U/L (ref 14–54)
AST: 31 U/L (ref 15–41)
BILIRUBIN INDIRECT: 2 mg/dL — AB (ref 0.3–0.9)
Bilirubin, Direct: 2.5 mg/dL — ABNORMAL HIGH (ref 0.1–0.5)
Total Bilirubin: 4.5 mg/dL — ABNORMAL HIGH (ref 0.3–1.2)
Total Protein: 4.6 g/dL — ABNORMAL LOW (ref 6.5–8.1)

## 2016-06-03 LAB — RENAL FUNCTION PANEL
ALBUMIN: 2.5 g/dL — AB (ref 3.5–5.0)
ALBUMIN: 2.8 g/dL — AB (ref 3.5–5.0)
ANION GAP: 9 (ref 5–15)
Albumin: 2.8 g/dL — ABNORMAL LOW (ref 3.5–5.0)
Albumin: 2.8 g/dL — ABNORMAL LOW (ref 3.5–5.0)
Anion gap: 8 (ref 5–15)
Anion gap: 8 (ref 5–15)
Anion gap: 10 (ref 5–15)
BUN: 27 mg/dL — ABNORMAL HIGH (ref 6–20)
BUN: 24 mg/dL — AB (ref 6–20)
BUN: 22 mg/dL — AB (ref 6–20)
BUN: 20 mg/dL (ref 6–20)
CALCIUM: 8.8 mg/dL — AB (ref 8.9–10.3)
CHLORIDE: 104 mmol/L (ref 101–111)
CHLORIDE: 104 mmol/L (ref 101–111)
CO2: 24 mmol/L (ref 22–32)
CO2: 24 mmol/L (ref 22–32)
CO2: 24 mmol/L (ref 22–32)
CO2: 23 mmol/L (ref 22–32)
CREATININE: 1.08 mg/dL — AB (ref 0.44–1.00)
CREATININE: 1.01 mg/dL — AB (ref 0.44–1.00)
CREATININE: 0.94 mg/dL (ref 0.44–1.00)
Calcium: 8.6 mg/dL — ABNORMAL LOW (ref 8.9–10.3)
Calcium: 8.8 mg/dL — ABNORMAL LOW (ref 8.9–10.3)
Calcium: 8.7 mg/dL — ABNORMAL LOW (ref 8.9–10.3)
Chloride: 105 mmol/L (ref 101–111)
Chloride: 102 mmol/L (ref 101–111)
Creatinine, Ser: 1.14 mg/dL — ABNORMAL HIGH (ref 0.44–1.00)
GFR calc Af Amer: 58 mL/min — ABNORMAL LOW (ref >60)
GFR calc Af Amer: >60 mL/min (ref >60)
GFR calc non Af Amer: >60 mL/min (ref >60)
GFR, EST NON AFRICAN AMERICAN: 50 mL/min — AB (ref >60)
GFR, EST NON AFRICAN AMERICAN: 54 mL/min — AB (ref >60)
GFR, EST NON AFRICAN AMERICAN: 58 mL/min — AB (ref >60)
GLUCOSE: 198 mg/dL — AB (ref 65–99)
Glucose, Bld: 201 mg/dL — ABNORMAL HIGH (ref 65–99)
Glucose, Bld: 194 mg/dL — ABNORMAL HIGH (ref 65–99)
Glucose, Bld: 199 mg/dL — ABNORMAL HIGH (ref 65–99)
PHOSPHORUS: 2.3 mg/dL — AB (ref 2.5–4.6)
PHOSPHORUS: 2 mg/dL — AB (ref 2.5–4.6)
POTASSIUM: 4 mmol/L (ref 3.5–5.1)
POTASSIUM: 4 mmol/L (ref 3.5–5.1)
POTASSIUM: 4 mmol/L (ref 3.5–5.1)
Phosphorus: 1.7 mg/dL — ABNORMAL LOW (ref 2.5–4.6)
Phosphorus: 1.9 mg/dL — ABNORMAL LOW (ref 2.5–4.6)
Potassium: 4.2 mmol/L (ref 3.5–5.1)
SODIUM: 135 mmol/L (ref 135–145)
Sodium: 137 mmol/L (ref 135–145)
Sodium: 137 mmol/L (ref 135–145)
Sodium: 136 mmol/L (ref 135–145)

## 2016-06-03 LAB — MAGNESIUM
MAGNESIUM: 1.9 mg/dL (ref 1.7–2.4)
MAGNESIUM: 1.8 mg/dL (ref 1.7–2.4)
MAGNESIUM: 1.7 mg/dL (ref 1.7–2.4)
Magnesium: 1.8 mg/dL (ref 1.7–2.4)

## 2016-06-03 LAB — APTT: aPTT: 81 seconds — ABNORMAL HIGH (ref 24–36)

## 2016-06-03 LAB — GLUCOSE, CAPILLARY
Glucose-Capillary: 193 mg/dL — ABNORMAL HIGH (ref 65–99)
Glucose-Capillary: 174 mg/dL — ABNORMAL HIGH (ref 65–99)
Glucose-Capillary: 172 mg/dL — ABNORMAL HIGH (ref 65–99)
Glucose-Capillary: 172 mg/dL — ABNORMAL HIGH (ref 65–99)
Glucose-Capillary: 202 mg/dL — ABNORMAL HIGH (ref 65–99)

## 2016-06-03 LAB — AMMONIA: Ammonia: 13 umol/L (ref 9–35)

## 2016-06-03 LAB — CULTURE, BLOOD (ROUTINE X 2)
CULTURE: NO GROWTH
Culture: NO GROWTH

## 2016-06-03 MED ORDER — POTASSIUM PHOSPHATES 15 MMOLE/5ML IV SOLN
10.0000 mmol | Freq: Once | INTRAVENOUS | Status: AC
  Administered 2016-06-04: 10 mmol via INTRAVENOUS
  Filled 2016-06-03: qty 3.33

## 2016-06-03 MED ORDER — POTASSIUM PHOSPHATES 15 MMOLE/5ML IV SOLN
10.0000 mmol | Freq: Once | INTRAVENOUS | Status: DC
  Filled 2016-06-03: qty 3.33

## 2016-06-03 MED ORDER — DEXTROSE 5 % IV SOLN
10.0000 mmol | Freq: Once | INTRAVENOUS | Status: AC
  Administered 2016-06-03: 10 mmol via INTRAVENOUS
  Filled 2016-06-03: qty 3.33

## 2016-06-03 NOTE — Progress Notes (Signed)
[facility] Critical Care Medicine Progess Note    SYNOPSIS  2 F with multiple severe medical problems recently discharged from [facility] to SNF and readmitted via ED with altered mental status and respiratory arrest. Clinical data and presentation consistent with severe sepsis/septic shock, unclear source   ASSESSMENT/PLAN   Ventilator dependent respiratory failure. Presently on pressure control ventilation with 5 of PEEP, set rate of 12 and FiO2 of 28%. Not making any progress towards weaning, still requiring significant hemodynamic support and sedation for ventilator patient synchrony. Will obtain chest x-ray  Septic shock. Presently on cefepime and doxycycline. CVP has been running 5-8. Patient has a warming anasarca hesitant to give more fluid resuscitation. Received albumin yesterday. We'll continue to wean norepinephrine as tolerated  Partial small bowel obstruction. Is being decompressed from above and below with proximally 900 mL drainage. Possibly faint bowel sounds this morning, has been started on Reglan, most likely ileus  Renal insufficiency. BUN/creatinine is 27/1.14 Presently on CRRT  Hyperglycemia. On coverage    Critical care time 35 minutes, respiratory failure, septic shock, change in abdominal exam  VENTILATOR SETTINGS: Vent Mode: PCV FiO2 (%):  [28 %-35 %] 28 % Set Rate:  [12 bmp] 12 bmp PEEP:  [5 cmH20] 5 cmH20  HEMODYNAMICS: CVP:  [0 mmHg-9 mmHg] 3 mmHg  INTAKE / OUTPUT:  Intake/Output Summary (Last 24 hours) at 06/03/16 0828 Last data filed at 06/03/16 0800  Gross per 24 hour  Intake         11831.97 ml  Output             6735 ml  Net          5096.97 ml    Name: Laura Kline MRN: [id number] DOB: April 23, 1955    ADMISSION DATE:  05-Jun-2016  SUBJECTIVE: No significant change in last 24 hours. Patient has had copious amounts of stool liquidy, C. difficile studies are negative.  Pt currently on the ventilator, can not provide history or  review of systems.   VITAL SIGNS: Temp:  [97.2 F (36.2 C)-97.7 F (36.5 C)] 97.6 F (36.4 C) (05/27 0720) Pulse Rate:  [83-127] 96 (05/27 0800) Resp:  [17-30] 23 (05/27 0800) BP: (63-150)/(15-91) 122/45 (05/27 0800) SpO2:  [97 %-100 %] 100 % (05/27 0820) FiO2 (%):  [28 %-35 %] 28 % (05/27 0820) Weight:  [166.5 kg (367 lb)] 166.5 kg (367 lb) (05/27 0300)   PHYSICAL EXAMINATION: Physical Examination:   VS: BP (!) 122/45 (BP Location: Right Arm)   Pulse 96   Temp 97.6 F (36.4 C) (Axillary)   Resp (!) 23   Ht 5' (1.524 m)   Wt (!) 166.5 kg (367 lb)   SpO2 100%   BMI 71.67 kg/m   General Appearance: Sedated, orally intubated, on CRRT, left subclavian line in place, on vasopressin at 0.03 and norepinephrine at 12 g Pulmonary: Coarse breath sounds appreciated with basilar crackles noted  CardiovascularNormal S1,S2.  No m/r/g.   Abdomen: Positive bowel sounds appreciated, mild tenderness with deep palpation noted diffusely Skin:   Lower extremity edema is appreciated with resolving cellulitis in the left lower family, left foot ulcer bandaged and dressed Extremities: Anasarca noted with diffuse edema    LABORATORY PANEL:   CBC  Recent Labs Lab 06/02/16 0816  WBC 4.5  HGB 10.5*  HCT 30.6*  PLT 68*    Chemistries   Recent Labs Lab 06/01/16 0256  06/03/16 0206  NA 135  135  < > 137  K 3.6  3.6  < > 4.0  CL 104  104  < > 104  CO2 26  25  < > 24  GLUCOSE 160*  161*  < > 201*  BUN 36*  36*  < > 27*  CREATININE 1.75*  1.79*  < > 1.14*  CALCIUM 8.4*  8.3*  < > 8.6*  MG 1.9  < > 1.9  PHOS 3.2  3.2  < > 2.3*  AST 47*  --   --   ALT 29  --   --   ALKPHOS 83  --   --   BILITOT 2.9*  --   --   < > = values in this interval not displayed.   Recent Labs Lab 06/02/16 1157 06/02/16 1620 06/02/16 1928 06/02/16 2348 06/03/16 0350 06/03/16 0803  GLUCAP 166* 163* 176* 180* 193* 174*    Recent Labs Lab 05/30/16 0152 05/30/16 0431 06/02/16 0320    PHART 7.11* 7.21* 7.50*  PCO2ART 44 47 28*  PO2ART 80* 107 121*    Recent Labs Lab 05/30/16 0529  05/31/16 0541  06/01/16 0256  06/02/16 1428 06/02/16 2018 06/03/16 0206  AST 44*  --  39  --  47*  --   --   --   --   ALT 29  --  26  --  29  --   --   --   --   ALKPHOS 81  --  82  --  83  --   --   --   --   BILITOT 4.0*  --  2.9*  --  2.9*  --   --   --   --   ALBUMIN 1.8*  < > 1.9*  < > 1.9*  2.0*  < > 2.3* 2.4* 2.5*  < > = values in this interval not displayed.  Cardiac Enzymes  Recent Labs Lab 05/11/2016 1503  TROPONINI 0.04*    RADIOLOGY:  Ct Abdomen Pelvis Wo Contrast  Result Date: 06/02/2016 CLINICAL DATA:  Generalized abdominal pain. EXAM: CT ABDOMEN AND PELVIS WITHOUT CONTRAST TECHNIQUE: Multidetector CT imaging of the abdomen and pelvis was performed following the standard protocol without IV contrast. COMPARISON:  CT scan of May 29, 2016. FINDINGS: Lower chest: Minimal bilateral posterior basilar subsegmental atelectasis. Hepatobiliary: No focal abnormality is seen in the liver on these unenhanced images. Minimal cholelithiasis is noted. Pancreas: Unremarkable. No pancreatic ductal dilatation or surrounding inflammatory changes. Spleen: Normal in size without focal abnormality. Adrenals/Urinary Tract: Adrenal glands appear normal. No hydronephrosis or renal obstruction is noted. No renal or ureteral calculi are noted. Urinary bladder is decompressed secondary to Foley catheter. Stomach/Bowel: Distal tip of nasogastric tube is seen in distal stomach. Mild gastric distention is noted. Small bowel dilatation is noted with nondilated terminal ileum, but transition zone is not clearly identified. The appendix appears normal. Rectal tube is noted. Vascular/Lymphatic: Aortic atherosclerosis. No enlarged abdominal or pelvic lymph nodes. Reproductive: Uterus and bilateral adnexa are unremarkable. Other: No abdominal wall hernia or abnormality. No abdominopelvic ascites.  Musculoskeletal: Multilevel degenerative disc disease is noted in the lower lumbar spine. IMPRESSION: Minimal cholelithiasis. Aortic atherosclerosis. Distal tip of nasogastric tube is seen in dilated distal stomach. Small bowel dilatation is noted with nondilated terminal ileum, but transition zone is not clearly identified. This is concerning for partial small bowel obstruction. Electronically Signed   By: Lupita RaiderJames  Green Jr, M.D.   On: 06/02/2016 10:41   Dg Chest Port 1 View  Result Date: 06/02/2016 CLINICAL  DATA:  Sepsis. EXAM: PORTABLE CHEST 1 VIEW COMPARISON:  Radiograph of Jun 01, 2016. FINDINGS: Stable cardiomediastinal silhouette. Endotracheal and nasogastric tubes are unchanged in position. Right internal jugular and left subclavian catheters are also unchanged in position. No pneumothorax or pleural effusion is noted. No acute pulmonary disease is noted. Bony thorax is unremarkable. IMPRESSION: Stable support apparatus. No acute cardiopulmonary abnormality seen. Electronically Signed   By: Lupita Raider, M.D.   On: 06/02/2016 09:11      Tora Kindred, DO [facility] Office Number: [phone]   06/03/2016

## 2016-06-03 NOTE — Progress Notes (Addendum)
PHARMACY - CRITICAL CARE PROGRESS NOTE  Pharmacy Consult for Electrolytes  Indication: electrolyte management, pt on CRRT   Allergies  Allergen Reactions  . Rofecoxib Other (See Comments)    Reaction: unknown  . Penicillins Rash and Other (See Comments)    Has patient had a PCN reaction causing immediate rash, facial/tongue/throat swelling, SOB or lightheadedness with hypotension: Unknown Has patient had a PCN reaction causing severe rash involving mucus membranes or skin necrosis: Unknown Has patient had a PCN reaction that required hospitalization: Unknown Has patient had a PCN reaction occurring within the last 10 years: Unknown If all of the above answers are "NO", then may proceed with Cephalosporin use.     Patient Measurements: Height: 5' (152.4 cm) Weight: (!) 367 lb (166.5 kg) IBW/kg (Calculated) : 45.5 Adjusted Body Weight:   Vital Signs: Temp: 97.9 F (36.6 C) (05/27 2000) Temp Source: Axillary (05/27 2000) BP: 131/55 (05/27 2100) Pulse Rate: 100 (05/27 2100) Intake/Output from previous day: 05/26 0701 - 05/27 0700 In: 11659.9 [I.V.:1585.7; NG/GT:357.5; IV Piggyback:9716.7] Out: 6734 [Urine:40; Emesis/NG output:2860; Stool:1600] Intake/Output from this shift: Total I/O In: 169.6 [I.V.:169.6] Out: 368 [Urine:50; Emesis/NG output:100; Other:193; Stool:25] Vent settings for last 24 hours: Vent Mode: PCV FiO2 (%):  [28 %] 28 % Set Rate:  [12 bmp] 12 bmp PEEP:  [5 cmH20] 5 cmH20  Labs:  Recent Labs  06/01/16 0256  06/02/16 0455  06/02/16 0816  06/03/16 0518 06/03/16 0822 06/03/16 1422 06/03/16 1502 06/03/16 2020  WBC 4.8  --   --   --  4.5  --   --   --   --   --   --   HGB 11.3*  --   --   --  10.5*  --   --   --   --   --   --   HCT 32.4*  --   --   --  30.6*  --   --   --   --   --   --   PLT 110*  --   --   --  68*  --   --   --   --   --   --   APTT 70*  --  80*  --   --   --  81*  --   --   --   --   CREATININE 1.75*  1.79*  < >  --   < >  1.26*  1.27*  < >  --  1.08* 1.01*  --  0.94  MG 1.9  < > 1.9  --  1.9  < >  --  1.8 1.8  --  1.7  PHOS 3.2  3.2  < > 2.5  < > 2.5  < >  --  2.0* 1.7*  --  1.9*  ALBUMIN 1.9*  2.0*  < >  --   < > 1.9*  < >  --  2.8* 2.8* 2.8* 2.8*  PROT 4.4*  --   --   --   --   --   --   --   --  4.6*  --   AST 47*  --   --   --   --   --   --   --   --  31  --   ALT 29  --   --   --   --   --   --   --   --  20  --   ALKPHOS 83  --   --   --   --   --   --   --   --  78  --   BILITOT 2.9*  --   --   --   --   --   --   --   --  4.5*  --   BILIDIR 1.7*  --   --   --   --   --   --   --   --  2.5*  --   IBILI 1.2*  --   --   --   --   --   --   --   --  2.0*  --   < > = values in this interval not displayed. Estimated Creatinine Clearance: 92 mL/min (by C-G formula based on SCr of 0.94 mg/dL).   Recent Labs  06/03/16 1131 06/03/16 1550 06/03/16 2004  GLUCAP 172* 172* 202*    Medications:  Prescriptions Prior to Admission  Medication Sig Dispense Refill Last Dose  . apixaban (ELIQUIS) 5 MG TABS tablet Take 1 tablet (5 mg total) by mouth 2 (two) times daily. 60 tablet 0 Jun 25, 2016 at am  . clindamycin (CLEOCIN) 300 MG capsule Take 1 capsule (300 mg total) by mouth 4 (four) times daily. 24 capsule 0 06-25-16 at am  . digoxin (LANOXIN) 0.25 MG tablet Take 1 tablet (0.25 mg total) by mouth daily.   2016-06-25 at am  . diltiazem (CARDIZEM CD) 180 MG 24 hr capsule Take 1 capsule (180 mg total) by mouth daily.   2016-06-25 at am  . furosemide (LASIX) 40 MG tablet Take 1 tablet (40 mg total) by mouth daily. 30 tablet  06-25-2016 at am  . HYDROcodone-acetaminophen (NORCO) 7.5-325 MG tablet Take 1 tablet by mouth every 6 (six) hours as needed for moderate pain. 30 tablet 0 prn at prn  . Insulin Glargine (LANTUS SOLOSTAR) 100 UNIT/ML Solostar Pen Inject 50 Units into the skin at bedtime. 15 mL 11 05/28/2016 at qhs  . levofloxacin (LEVAQUIN) 500 MG tablet Take 1 tablet (500 mg total) by mouth daily. 6 tablet 0  Jun 25, 2016 at am  . loperamide (IMODIUM A-D) 2 MG tablet Take 2 mg by mouth every 8 (eight) hours as needed for diarrhea or loose stools.   prn at prn  . metoprolol (LOPRESSOR) 50 MG tablet Take 1 tablet (50 mg total) by mouth 2 (two) times daily. 180 tablet 3 June 25, 2016 at am  . norethindrone (AYGESTIN) 5 MG tablet Take 1 tablet (5 mg total) by mouth 3 (three) times daily. 30 tablet 0 25-Jun-2016 at am  . nystatin (MYCOSTATIN/NYSTOP) powder Apply 1 g topically as needed (for rash and redness).   prn at prn  . ondansetron (ZOFRAN) 4 MG tablet Take 1 tablet (4 mg total) by mouth every 6 (six) hours as needed for nausea. 20 tablet 0 prn at prn  . rosuvastatin (CRESTOR) 10 MG tablet Take 1 tablet (10 mg total) by mouth daily after supper. 90 tablet 3 05/28/2016 at pm  . senna-docusate (SENOKOT-S) 8.6-50 MG tablet Take 1 tablet by mouth 2 (two) times daily.   2016/06/25 at am    Assessment: 5/24: K @ 18:00 = 3.3   Goal of Therapy:  Normalization of electrolytes   Plan:  KCl 10 mEq IV X 3 ordered to be given around 5/24 @ 22:00. Will recheck electrolytes on 5/25 @ 2:00.   5/25 @ 0256 K 3.6,  Mg 1.9. No further supplementation warranted at this time. Will follow up w/ BMP during am labs.  5/26 0816:  K 4.0,  Mag 1.9  Phos 2.5. Electrolytes WNL, No supplementation needed. F/u with next labs (currently ordered Q6h)  5/26 1428: Electrolytes WNL. No supplementation needed. F/u with next labs (currently ordered Q6h per Nephrology). Patient on CRRT  5/27 02:30 K+ 4.0, Mg 1.9, PO4 2.3. Will hold off replacing PO4 for now since patient is on renal replacement therapy.   5/27 0822 labs: K 4.0, Mag 1.8, Phos 2.0.  Phos level has been trending down. Nephrology following, patient on CRRT. Will order Potassium Phosphate 10 mmol IV x 1. F/u with next labs.  5/27 1422 labs: K 4.0,  Mag 1.8,  Phos now 1.7. KPhos IV ordered this am was not given. Will order Potassium Phosphate 10 mmol IV x 1 (conservative  dosing for patient on CRRT). F/u with next labs.  5/27 2030 labs: K 4.2, Phos 1.9. Will order another Potassium Phosphate 10mmol IV bolus. F/u with next labs.  5/28 Mg 1.6 and PO4 1.7. 10 mmol potassium phosphate from earlier was held due to first bag still running. RN asked to go ahead and hang the second bag of potassium phosphate. Magnesium sulfate 2 grams IV x1 ordered. Recheck at next interval.   Clovia CuffLisa Kluttz, PharmD, BCPS 06/03/2016 9:25 PM

## 2016-06-03 NOTE — Consult Note (Signed)
WOC Nurse wound consult note Reason for Consult: Linear skin breakdown in the intergluteal crease extending to the sacral area, erythema to the skin folds at the subpannicular, inframammary, bilateral inguinal and hip skin folds, blisters and purple discoloration in the mid-thoracic region. Incontinence associated dermatitis in the perianal area. Wound type: Moisture associated skin damage (MASD), specifically incontinence associated dermatitis (IAD) and intertriginous dermatitis (ITD). Suspected pressure in combination with moisture at thoracic area. Pressure Injury POA:Yes Measurement:  1. Linear intragluteal skin injury measures 12cm x 1cm x 0.1cm 2. Mid-thoracic area measures 5cm x 4cm with intact serum-filled blisters (2) and purple discoloration. 3. Inframammary (bilateral): erythema, no break in skin 4.  Subpannicular and bilateral inguinal: scattered open area along erythematous "line", moist pink wound bed, scant serous drainage. 5. Hip skin folds: moist, erythematous, no break in skin integrity. 6.  Left foot, second digit full thickness wound:  1cm x 1.2cm x 0.2cm red, moist with small amount of light yellow exudate on old dressing Wound bed: As described above Drainage (amount, consistency, odor) Scant serous in the intertriginous areas, small amount over the left foot second toe wound. Periwound: Dry, flaking Dressing procedure/placement/frequency: Every skin fold presents with intertriginous dermatitis, patient is on systemic antifungal and the product InterDry Ag+ is in use, but some folds do not have the dressing and would benefit.  We will add to those areas. I have added Bilateral Prevalon pressure redistribution heel boots and wound care orders using silicone foam dressings to the linear skin fold tissue loss at the intragluteal cleft and the mid-thoracic blistered (partial thickness, Stage 2).  Patient is on a mattress replacement with low air loss feature.She is critically ill, but  being turned and repositioned per protocol.  She has an indwelling urinary catheter and a bowel management system in place. WOC nursing team will not follow, but will remain available to this patient, the nursing and medical teams.  Please re-consult if needed. Thanks, Ladona MowLaurie Lourine Alberico, MSN, RN, GNP, Hans EdenCWOCN, CWON-AP, FAAN  Pager# [phone]

## 2016-06-03 NOTE — Progress Notes (Signed)
PHARMACY - CRITICAL CARE PROGRESS NOTE  Pharmacy Consult for Electrolytes  Indication: electrolyte management, pt on CRRT   Allergies  Allergen Reactions  . Rofecoxib Other (See Comments)    Reaction: unknown  . Penicillins Rash and Other (See Comments)    Has patient had a PCN reaction causing immediate rash, facial/tongue/throat swelling, SOB or lightheadedness with hypotension: Unknown Has patient had a PCN reaction causing severe rash involving mucus membranes or skin necrosis: Unknown Has patient had a PCN reaction that required hospitalization: Unknown Has patient had a PCN reaction occurring within the last 10 years: Unknown If all of the above answers are "NO", then may proceed with Cephalosporin use.     Patient Measurements: Height: 5' (152.4 cm) Weight: (!) 367 lb (166.5 kg) IBW/kg (Calculated) : 45.5 Adjusted Body Weight:   Vital Signs: Temp: 97.6 F (36.4 C) (05/27 0720) Temp Source: Axillary (05/27 0720) BP: 119/74 (05/27 0900) Pulse Rate: 105 (05/27 0900) Intake/Output from previous day: 05/26 0701 - 05/27 0700 In: 11659.9 [I.V.:1585.7; NG/GT:357.5; IV Piggyback:9716.7] Out: 6734 [Urine:40; Emesis/NG output:2860; Stool:1600] Intake/Output from this shift: Total I/O In: 448.6 [I.V.:348.6; IV Piggyback:100] Out: 97 [Other:97] Vent settings for last 24 hours: Vent Mode: PCV FiO2 (%):  [28 %-35 %] 28 % Set Rate:  [12 bmp] 12 bmp PEEP:  [5 cmH20] 5 cmH20  Labs:  Recent Labs  06/01/16 0256  06/02/16 0455  06/02/16 0816  06/02/16 2018 06/03/16 0206 06/03/16 0518 06/03/16 0822  WBC 4.8  --   --   --  4.5  --   --   --   --   --   HGB 11.3*  --   --   --  10.5*  --   --   --   --   --   HCT 32.4*  --   --   --  30.6*  --   --   --   --   --   PLT 110*  --   --   --  68*  --   --   --   --   --   APTT 70*  --  80*  --   --   --   --   --  81*  --   CREATININE 1.75*  1.79*  < >  --   < > 1.26*  1.27*  < > 1.13* 1.14*  --  1.08*  MG 1.9  < > 1.9  --   1.9  < > 1.9 1.9  --  1.8  PHOS 3.2  3.2  < > 2.5  < > 2.5  < > 2.4* 2.3*  --  2.0*  ALBUMIN 1.9*  2.0*  < >  --   < > 1.9*  < > 2.4* 2.5*  --  2.8*  PROT 4.4*  --   --   --   --   --   --   --   --   --   AST 47*  --   --   --   --   --   --   --   --   --   ALT 29  --   --   --   --   --   --   --   --   --   ALKPHOS 83  --   --   --   --   --   --   --   --   --  BILITOT 2.9*  --   --   --   --   --   --   --   --   --   BILIDIR 1.7*  --   --   --   --   --   --   --   --   --   IBILI 1.2*  --   --   --   --   --   --   --   --   --   < > = values in this interval not displayed. Estimated Creatinine Clearance: 80.1 mL/min (A) (by C-G formula based on SCr of 1.08 mg/dL (H)).   Recent Labs  06/02/16 2348 06/03/16 0350 06/03/16 0803  GLUCAP 180* 193* 174*    Medications:  Prescriptions Prior to Admission  Medication Sig Dispense Refill Last Dose  . apixaban (ELIQUIS) 5 MG TABS tablet Take 1 tablet (5 mg total) by mouth 2 (two) times daily. 60 tablet 0 05/21/2016 at am  . clindamycin (CLEOCIN) 300 MG capsule Take 1 capsule (300 mg total) by mouth 4 (four) times daily. 24 capsule 0 06/04/2016 at am  . digoxin (LANOXIN) 0.25 MG tablet Take 1 tablet (0.25 mg total) by mouth daily.   06/07/2016 at am  . diltiazem (CARDIZEM CD) 180 MG 24 hr capsule Take 1 capsule (180 mg total) by mouth daily.   05/28/2016 at am  . furosemide (LASIX) 40 MG tablet Take 1 tablet (40 mg total) by mouth daily. 30 tablet  05/13/2016 at am  . HYDROcodone-acetaminophen (NORCO) 7.5-325 MG tablet Take 1 tablet by mouth every 6 (six) hours as needed for moderate pain. 30 tablet 0 prn at prn  . Insulin Glargine (LANTUS SOLOSTAR) 100 UNIT/ML Solostar Pen Inject 50 Units into the skin at bedtime. 15 mL 11 05/28/2016 at qhs  . levofloxacin (LEVAQUIN) 500 MG tablet Take 1 tablet (500 mg total) by mouth daily. 6 tablet 0 06/05/2016 at am  . loperamide (IMODIUM A-D) 2 MG tablet Take 2 mg by mouth every 8 (eight) hours as  needed for diarrhea or loose stools.   prn at prn  . metoprolol (LOPRESSOR) 50 MG tablet Take 1 tablet (50 mg total) by mouth 2 (two) times daily. 180 tablet 3 05/24/2016 at am  . norethindrone (AYGESTIN) 5 MG tablet Take 1 tablet (5 mg total) by mouth 3 (three) times daily. 30 tablet 0 05/19/2016 at am  . nystatin (MYCOSTATIN/NYSTOP) powder Apply 1 g topically as needed (for rash and redness).   prn at prn  . ondansetron (ZOFRAN) 4 MG tablet Take 1 tablet (4 mg total) by mouth every 6 (six) hours as needed for nausea. 20 tablet 0 prn at prn  . rosuvastatin (CRESTOR) 10 MG tablet Take 1 tablet (10 mg total) by mouth daily after supper. 90 tablet 3 05/28/2016 at pm  . senna-docusate (SENOKOT-S) 8.6-50 MG tablet Take 1 tablet by mouth 2 (two) times daily.   06/05/2016 at am    Assessment: 5/24: K @ 18:00 = 3.3   Goal of Therapy:  Normalization of electrolytes   Plan:  KCl 10 mEq IV X 3 ordered to be given around 5/24 @ 22:00. Will recheck electrolytes on 5/25 @ 2:00.   5/25 @ 0256 K 3.6, Mg 1.9. No further supplementation warranted at this time. Will follow up w/ BMP during am labs.  5/26 0816:  K 4.0,  Mag 1.9  Phos 2.5. Electrolytes WNL, No supplementation needed. F/u with next  labs (currently ordered Q6h)  5/26 1428: Electrolytes WNL. No supplementation needed. F/u with next labs (currently ordered Q6h per Nephrology). Patient on CRRT  5/27 02:30 K+ 4.0, Mg 1.9, PO4 2.3. Will hold off replacing PO4 for now since patient is on renal replacement therapy.   5/27 0822 labs: K 4.0, Mag 1.8, Phos 2.0.  Phos level has been trending down. Nephrology following, patient on CRRT. Will order Potassium Phosphate 10 mmol IV x 1. F/u with next labs.    Bari MantisKristin Jerimie Mancuso PharmD Clinical Pharmacist 06/03/2016

## 2016-06-03 NOTE — Progress Notes (Signed)
CRRT cartridge changed. Will continue to monitor patient.

## 2016-06-03 NOTE — Progress Notes (Signed)
[facility] - [place] at [facility]   PATIENT NAME: Laura Kline    MR#:  [id number]  DATE OF BIRTH:  1955-02-28  SUBJECTIVE:   Patient here due to septic shock with multiorgan failure. Remains critically ill On CRRT, 2 vasopressors.  Afebrile Anuric  Seen on 06/02/2016  REVIEW OF SYSTEMS:    Review of Systems  Unable to perform ROS: Intubated    DRUG ALLERGIES:   Allergies  Allergen Reactions  . Rofecoxib Other (See Comments)    Reaction: unknown  . Penicillins Rash and Other (See Comments)    Has patient had a PCN reaction causing immediate rash, facial/tongue/throat swelling, SOB or lightheadedness with hypotension: Unknown Has patient had a PCN reaction causing severe rash involving mucus membranes or skin necrosis: Unknown Has patient had a PCN reaction that required hospitalization: Unknown Has patient had a PCN reaction occurring within the last 10 years: Unknown If all of the above answers are "NO", then may proceed with Cephalosporin use.     VITALS:  Blood pressure 103/67, pulse 92, temperature 97.6 F (36.4 C), temperature source Axillary, resp. rate 18, height 5' (1.524 m), weight (!) 166.5 kg (367 lb), SpO2 100 %.  PHYSICAL EXAMINATION:   Physical Exam  GENERAL:  61 y.o.-year-old obese patient lying in bed critically ill appearing.  EYES: Pupils equal, round, reactive to light. No scleral icterus.  HEENT: Head atraumatic, normocephalic. Oropharynx and nasopharynx clear. ET and OG tube in place.  NECK:  Supple, no jugular venous distention. No thyroid enlargement, no tenderness.  LUNGS: Normal breath sounds bilaterally, no wheezing, rales, rhonchi. No use of accessory muscles of respiration.  CARDIOVASCULAR: S1, S2 normal.  ABDOMEN: Soft, mild diffuse tenderness, nondistended.  EXTREMITIES: No cyanosis, clubbing, +1-2 edema b/l.    NEUROLOGIC: sedated & Intubated but follows simple commands.  PSYCHIATRIC: Sedated and intubated.  SKIN:  No obvious rash, lesion, LLE ulcer with no acute drainage.  Ulcer located in between the first & 2nd Toes.  Dressing   LABORATORY PANEL:   CBC  Recent Labs Lab 06/02/16 0816  WBC 4.5  HGB 10.5*  HCT 30.6*  PLT 68*   ------------------------------------------------------------------------------------------------------------------  Chemistries   Recent Labs Lab 06/01/16 0256  06/03/16 0206  NA 135  135  < > 137  K 3.6  3.6  < > 4.0  CL 104  104  < > 104  CO2 26  25  < > 24  GLUCOSE 160*  161*  < > 201*  BUN 36*  36*  < > 27*  CREATININE 1.75*  1.79*  < > 1.14*  CALCIUM 8.4*  8.3*  < > 8.6*  MG 1.9  < > 1.9  AST 47*  --   --   ALT 29  --   --   ALKPHOS 83  --   --   BILITOT 2.9*  --   --   < > = values in this interval not displayed. ------------------------------------------------------------------------------------------------------------------  Cardiac Enzymes  Recent Labs Lab 2016/06/05 1503  TROPONINI 0.04*   ------------------------------------------------------------------------------------------------------------------  RADIOLOGY:  Ct Abdomen Pelvis Wo Contrast  Result Date: 06/02/2016 CLINICAL DATA:  Generalized abdominal pain. EXAM: CT ABDOMEN AND PELVIS WITHOUT CONTRAST TECHNIQUE: Multidetector CT imaging of the abdomen and pelvis was performed following the standard protocol without IV contrast. COMPARISON:  CT scan of Jun 05, 2016. FINDINGS: Lower chest: Minimal bilateral posterior basilar subsegmental atelectasis. Hepatobiliary: No focal abnormality is seen in the liver on these unenhanced images. Minimal cholelithiasis  is noted. Pancreas: Unremarkable. No pancreatic ductal dilatation or surrounding inflammatory changes. Spleen: Normal in size without focal abnormality. Adrenals/Urinary Tract: Adrenal glands appear normal. No hydronephrosis or renal obstruction is noted. No renal or ureteral calculi are noted. Urinary bladder is decompressed  secondary to Foley catheter. Stomach/Bowel: Distal tip of nasogastric tube is seen in distal stomach. Mild gastric distention is noted. Small bowel dilatation is noted with nondilated terminal ileum, but transition zone is not clearly identified. The appendix appears normal. Rectal tube is noted. Vascular/Lymphatic: Aortic atherosclerosis. No enlarged abdominal or pelvic lymph nodes. Reproductive: Uterus and bilateral adnexa are unremarkable. Other: No abdominal wall hernia or abnormality. No abdominopelvic ascites. Musculoskeletal: Multilevel degenerative disc disease is noted in the lower lumbar spine. IMPRESSION: Minimal cholelithiasis. Aortic atherosclerosis. Distal tip of nasogastric tube is seen in dilated distal stomach. Small bowel dilatation is noted with nondilated terminal ileum, but transition zone is not clearly identified. This is concerning for partial small bowel obstruction. Electronically Signed   By: Lupita RaiderJames  Green Jr, M.D.   On: 06/02/2016 10:41   Dg Chest Port 1 View  Result Date: 06/02/2016 CLINICAL DATA:  Sepsis. EXAM: PORTABLE CHEST 1 VIEW COMPARISON:  Radiograph of Jun 01, 2016. FINDINGS: Stable cardiomediastinal silhouette. Endotracheal and nasogastric tubes are unchanged in position. Right internal jugular and left subclavian catheters are also unchanged in position. No pneumothorax or pleural effusion is noted. No acute pulmonary disease is noted. Bony thorax is unremarkable. IMPRESSION: Stable support apparatus. No acute cardiopulmonary abnormality seen. Electronically Signed   By: Lupita RaiderJames  Green Jr, M.D.   On: 06/02/2016 09:11     ASSESSMENT AND PLAN:   61 year old female with past medical history of peripheral vascular disease, morbid obesity, insulin-dependent diabetes, hypertension, hyperlipidemia, essential hypertension, recent admission for left lower extremity cellulitis and acute kidney injury of presents to the hospital due to altered mental status and noted to be in  septic shock.  * Sepsis with septic shock-source unclear but suspected to be due to left lower extremity ulcer/cellulitis. -Seen by infectious disease and continue IV vancomycin, cefepime and doxycycline. Fluconazole added.  cultures so far (-).  - Urine growing Yeast but unlikely has clinical significance.   * Septic shock- now back on 2 vasopressors with Levophed, Vasopressin. -continue vasopressors. Continue empiric antibiotics as mentioned above.  - wean pressors with goal MAP of 55.  * Acute respiratory failure-patient intubated due to obtundation and altered mental status and unable to maintain her airway. -Continue vent support, now on 30% FiO2 and wean as per pulmonary/intensivist.  * Acute renal failure - Anuric-secondary to sepsis and septic shock. - seen by nephrology, cont. CRRT as pt. Remains Anuric.   * Metabolic acidosis-secondary to sepsis and also renal failure. - cont. CRRT and supportive care.  * Diabetes type 2 without complication- BS labile and cont. Insulin gtt.   * Acute toxic metabolic metabolic encephalopathy secondary to sepsis, septic shock and multiorgan failure. Follow mental status once patient is extubated and her sepsis has improved.  Following simple commands.   * Partial SBO Reglan. Hold TFs  Patient is critically ill with multiorgan failure. Prognosis is poor. Paliative care consult ordered.  All the records are reviewed and case discussed with Care Management/Social Worker. Management plans discussed with the patient, family and they are in agreement.  CODE STATUS: Full  DVT Prophylaxis: Hep. SQ  TOTAL TIME TAKING CARE OF THIS PATIENT: 30 minutes  Milagros LollSudini, [place] R M.D on 06/03/2016 at 7:51 AM  Between 7am  to 6pm - Pager - [phone]  After 6pm go to [email or web address] - Social research officer, government  [facility] [place] [facility]  Office  [phone]  CC: Primary care physician; Patient, No Pcp Per

## 2016-06-03 NOTE — Progress Notes (Signed)
[facility] - [place] at [facility]   PATIENT NAME: Laura Kline    MR#:  [id number]  DATE OF BIRTH:  09/13/1955  SUBJECTIVE:    Remains critically ill On CRRT 2 vasopressors.  Afebrile Anuric - 25 ml UOP  Following simple commands  REVIEW OF SYSTEMS:    Review of Systems  Unable to perform ROS: Intubated   DRUG ALLERGIES:   Allergies  Allergen Reactions  . Rofecoxib Other (See Comments)    Reaction: unknown  . Penicillins Rash and Other (See Comments)    Has patient had a PCN reaction causing immediate rash, facial/tongue/throat swelling, SOB or lightheadedness with hypotension: Unknown Has patient had a PCN reaction causing severe rash involving mucus membranes or skin necrosis: Unknown Has patient had a PCN reaction that required hospitalization: Unknown Has patient had a PCN reaction occurring within the last 10 years: Unknown If all of the above answers are "NO", then may proceed with Cephalosporin use.     VITALS:  Blood pressure (!) 122/45, pulse 96, temperature 97.6 F (36.4 C), temperature source Axillary, resp. rate (!) 23, height 5' (1.524 m), weight (!) 166.5 kg (367 lb), SpO2 100 %.  PHYSICAL EXAMINATION:   Physical Exam  GENERAL:  61 y.o.-year-old obese patient lying in bed critically ill appearing.  EYES: Pupils equal, round, reactive to light. No scleral icterus.  HEENT: Head atraumatic, normocephalic. Oropharynx and nasopharynx clear. ET and OG tube in place.  NECK:  Supple, no jugular venous distention. No thyroid enlargement, no tenderness.  LUNGS: Normal breath sounds bilaterally, no wheezing, rales, rhonchi. No use of accessory muscles of respiration.  CARDIOVASCULAR: S1, S2 normal.  ABDOMEN: Soft, mild diffuse tenderness, nondistended.  EXTREMITIES: No cyanosis, clubbing, +1-2 edema b/l.    NEUROLOGIC: sedated & Intubated but follows simple commands.  PSYCHIATRIC: Sedated and intubated.  SKIN: No obvious rash, lesion, LLE  ulcer with no acute drainage.  Ulcer located in between the first & 2nd Toes.  Dressing   LABORATORY PANEL:   CBC  Recent Labs Lab 06/02/16 0816  WBC 4.5  HGB 10.5*  HCT 30.6*  PLT 68*   ------------------------------------------------------------------------------------------------------------------  Chemistries   Recent Labs Lab 06/01/16 0256  06/03/16 0206  NA 135  135  < > 137  K 3.6  3.6  < > 4.0  CL 104  104  < > 104  CO2 26  25  < > 24  GLUCOSE 160*  161*  < > 201*  BUN 36*  36*  < > 27*  CREATININE 1.75*  1.79*  < > 1.14*  CALCIUM 8.4*  8.3*  < > 8.6*  MG 1.9  < > 1.9  AST 47*  --   --   ALT 29  --   --   ALKPHOS 83  --   --   BILITOT 2.9*  --   --   < > = values in this interval not displayed. ------------------------------------------------------------------------------------------------------------------  Cardiac Enzymes  Recent Labs Lab 2016-03-06 1503  TROPONINI 0.04*   ------------------------------------------------------------------------------------------------------------------  RADIOLOGY:  Ct Abdomen Pelvis Wo Contrast  Result Date: 06/02/2016 CLINICAL DATA:  Generalized abdominal pain. EXAM: CT ABDOMEN AND PELVIS WITHOUT CONTRAST TECHNIQUE: Multidetector CT imaging of the abdomen and pelvis was performed following the standard protocol without IV contrast. COMPARISON:  CT scan of May 29, 2016. FINDINGS: Lower chest: Minimal bilateral posterior basilar subsegmental atelectasis. Hepatobiliary: No focal abnormality is seen in the liver on these unenhanced images. Minimal cholelithiasis is noted. Pancreas:  Unremarkable. No pancreatic ductal dilatation or surrounding inflammatory changes. Spleen: Normal in size without focal abnormality. Adrenals/Urinary Tract: Adrenal glands appear normal. No hydronephrosis or renal obstruction is noted. No renal or ureteral calculi are noted. Urinary bladder is decompressed secondary to Foley catheter.  Stomach/Bowel: Distal tip of nasogastric tube is seen in distal stomach. Mild gastric distention is noted. Small bowel dilatation is noted with nondilated terminal ileum, but transition zone is not clearly identified. The appendix appears normal. Rectal tube is noted. Vascular/Lymphatic: Aortic atherosclerosis. No enlarged abdominal or pelvic lymph nodes. Reproductive: Uterus and bilateral adnexa are unremarkable. Other: No abdominal wall hernia or abnormality. No abdominopelvic ascites. Musculoskeletal: Multilevel degenerative disc disease is noted in the lower lumbar spine. IMPRESSION: Minimal cholelithiasis. Aortic atherosclerosis. Distal tip of nasogastric tube is seen in dilated distal stomach. Small bowel dilatation is noted with nondilated terminal ileum, but transition zone is not clearly identified. This is concerning for partial small bowel obstruction. Electronically Signed   By: Lupita Raider, M.D.   On: 06/02/2016 10:41   Dg Chest Port 1 View  Result Date: 06/02/2016 CLINICAL DATA:  Sepsis. EXAM: PORTABLE CHEST 1 VIEW COMPARISON:  Radiograph of Jun 01, 2016. FINDINGS: Stable cardiomediastinal silhouette. Endotracheal and nasogastric tubes are unchanged in position. Right internal jugular and left subclavian catheters are also unchanged in position. No pneumothorax or pleural effusion is noted. No acute pulmonary disease is noted. Bony thorax is unremarkable. IMPRESSION: Stable support apparatus. No acute cardiopulmonary abnormality seen. Electronically Signed   By: Lupita Raider, M.D.   On: 06/02/2016 09:11     ASSESSMENT AND PLAN:   61 year old female with past medical history of peripheral vascular disease, morbid obesity, insulin-dependent diabetes, hypertension, hyperlipidemia, essential hypertension, recent admission for left lower extremity cellulitis and acute kidney injury of presented to the hospital due to altered mental status and noted to be in septic shock.  * Sepsis with  septic shock-source unclear but suspected to be due to left lower extremity ulcer/cellulitis. -Seen by infectious disease and continue IV cefepime, Flagyl and doxycycline. Fluconazole added.  cultures so far (-).  - Urine growing Yeast but likely colonized. On fluconazole  * Septic shock- now back on 2 vasopressors with Levophed, Vasopressin. -continue vasopressors. Continue empiric antibiotics as mentioned above.  * Acute respiratory failure-patient intubated due to obtundation and altered mental status and unable to maintain her airway -Continue vent support  on 30% FiO2 and wean as per pulmonary/intensivist.  * Acute renal failure - Anuric-secondary to sepsis and septic shock. - seen by nephrology, cont. CRRT as pt. Remains Anuric.   * Metabolic acidosis-secondary to septic shock and renal failure. - On CRRT  * Diabetes type 2 without complication Insulin gtt  * Acute toxic metabolic metabolic encephalopathy secondary to septic shock and ARF  Following simple commands.   * Partial SBO on CT abdomen Reglan. TFs held for now  Patient is critically ill with multiorgan failure. Prognosis is poor. Paliative care consult ordered.  All the records are reviewed and case discussed with Care Management/Social Worker. Management plans discussed with the patient, family and they are in agreement.  CODE STATUS: Full  DVT Prophylaxis: Hep. SQ  TOTAL TIME TAKING CARE OF THIS PATIENT: 25 minutes  Milagros Loll R M.D on 06/03/2016 at 8:15 AM  Between 7am to 6pm - Pager - [phone]  After 6pm go to [email or web address] - Scientist, research (life sciences)  Hospitalists  Office  [phone]  CC: Primary care physician;  Patient, No Pcp Per

## 2016-06-03 NOTE — Progress Notes (Signed)
Pharmacy Antibiotic Note/CRRT Medication Adjusment  Laura Kline is a 61 y.o. female with a recent I&D of left foot abscess admitted on 05/30/2016 with sepsis/unclear source.  Pharmacy has been consulted for vancomycin and cefepime and Fluconazole dosing.   Patient on CRRT for acute renal failure. Patient is also ordered doxycycline and metronidazole per ID.   Vancomycin d/c 5/25 per ID.  Plan: 1. ABX: Will continue cefepime 2 g iv q 12 hours for CRRT Will continue Fluconazole 400 mg IV Q24h  2. Aside from antibiotics, no medications require adjustment for CRRT at present.      Height: 5' (152.4 cm) Weight: (!) 367 lb (166.5 kg) IBW/kg (Calculated) : 45.5  Temp (24hrs), Avg:97.5 F (36.4 C), Min:97.2 F (36.2 C), Max:97.7 F (36.5 C)   Recent Labs Lab 06/04/2016 1503 05/28/2016 1900  05/30/16 0529  05/31/16 0541  06/01/16 0256  06/01/16 2010  06/02/16 0430  06/02/16 0816 06/02/16 1428 06/02/16 2018 06/03/16 0206 06/03/16 0822  WBC 19.8*  --   --  10.3  --  9.3  --  4.8  --   --   --   --   --  4.5  --   --   --   --   CREATININE 3.42*  --   < > 3.07*  < > 2.55*  < > 1.75*  1.79*  < > 1.46*  < >  --   < > 1.26*  1.27* 1.24* 1.13* 1.14* 1.08*  LATICACIDVEN 4.8* 2.1*  --   --   --   --   --   --   --   --   --  3.3*  --   --   --   --   --   --   VANCORANDOM  --   --   --   --   --   --   --   --   --  10  --   --   --   --   --   --   --   --   < > = values in this interval not displayed.  Estimated Creatinine Clearance: 80.1 mL/min (A) (by C-G formula based on SCr of 1.08 mg/dL (H)).    Allergies  Allergen Reactions  . Rofecoxib Other (See Comments)    Reaction: unknown  . Penicillins Rash and Other (See Comments)    Has patient had a PCN reaction causing immediate rash, facial/tongue/throat swelling, SOB or lightheadedness with hypotension: Unknown Has patient had a PCN reaction causing severe rash involving mucus membranes or skin necrosis: Unknown Has  patient had a PCN reaction that required hospitalization: Unknown Has patient had a PCN reaction occurring within the last 10 years: Unknown If all of the above answers are "NO", then may proceed with Cephalosporin use.     Antimicrobials this admission: vanc 5/22 >>  levofloxacin 5/22 >> 5/23 Cefepime 5/22 >> Metronidazole 5/23 >> Doxycycline >>  Dose adjustments this admission:  Microbiology results: BCx: NGTD TA: abundant yeast, rare mold 5/23 UCx: yeast 5/25 Ucx: yeast  MRSA PCR: negative C diff: sent GI PCR: sent  Thank you for allowing pharmacy to be a part of this patient's care.  Roshawn Ayala A Clinical Pharmacist  06/03/2016 9:32 AM

## 2016-06-03 NOTE — Progress Notes (Signed)
PHARMACY - CRITICAL CARE PROGRESS NOTE  Pharmacy Consult for Electrolytes  Indication: electrolyte management, pt on CRRT   Allergies  Allergen Reactions  . Rofecoxib Other (See Comments)    Reaction: unknown  . Penicillins Rash and Other (See Comments)    Has patient had a PCN reaction causing immediate rash, facial/tongue/throat swelling, SOB or lightheadedness with hypotension: Unknown Has patient had a PCN reaction causing severe rash involving mucus membranes or skin necrosis: Unknown Has patient had a PCN reaction that required hospitalization: Unknown Has patient had a PCN reaction occurring within the last 10 years: Unknown If all of the above answers are "NO", then may proceed with Cephalosporin use.     Patient Measurements: Height: 5' (152.4 cm) Weight: (!) 367 lb (166.5 kg) IBW/kg (Calculated) : 45.5 Adjusted Body Weight:   Vital Signs: Temp: 97.6 F (36.4 C) (05/27 1200) Temp Source: Axillary (05/27 1200) BP: 134/56 (05/27 1500) Pulse Rate: 97 (05/27 1500) Intake/Output from previous day: 05/26 0701 - 05/27 0700 In: 11659.9 [I.V.:1585.7; NG/GT:357.5; IV Piggyback:9716.7] Out: 6734 [Urine:40; Emesis/NG output:2860; Stool:1600] Intake/Output from this shift: Total I/O In: 1077.8 [I.V.:777.8; IV Piggyback:300] Out: 599 [Urine:14; Other:585] Vent settings for last 24 hours: Vent Mode: PCV FiO2 (%):  [28 %-35 %] 28 % Set Rate:  [12 bmp] 12 bmp PEEP:  [5 cmH20] 5 cmH20  Labs:  Recent Labs  06/01/16 0256  06/02/16 0455  06/02/16 0816  06/03/16 0206 06/03/16 0518 06/03/16 0822 06/03/16 1422  WBC 4.8  --   --   --  4.5  --   --   --   --   --   HGB 11.3*  --   --   --  10.5*  --   --   --   --   --   HCT 32.4*  --   --   --  30.6*  --   --   --   --   --   PLT 110*  --   --   --  68*  --   --   --   --   --   APTT 70*  --  80*  --   --   --   --  81*  --   --   CREATININE 1.75*  1.79*  < >  --   < > 1.26*  1.27*  < > 1.14*  --  1.08* 1.01*  MG 1.9  <  > 1.9  --  1.9  < > 1.9  --  1.8 1.8  PHOS 3.2  3.2  < > 2.5  < > 2.5  < > 2.3*  --  2.0* 1.7*  ALBUMIN 1.9*  2.0*  < >  --   < > 1.9*  < > 2.5*  --  2.8* 2.8*  PROT 4.4*  --   --   --   --   --   --   --   --   --   AST 47*  --   --   --   --   --   --   --   --   --   ALT 29  --   --   --   --   --   --   --   --   --   ALKPHOS 83  --   --   --   --   --   --   --   --   --  BILITOT 2.9*  --   --   --   --   --   --   --   --   --   BILIDIR 1.7*  --   --   --   --   --   --   --   --   --   IBILI 1.2*  --   --   --   --   --   --   --   --   --   < > = values in this interval not displayed. Estimated Creatinine Clearance: 85.6 mL/min (A) (by C-G formula based on SCr of 1.01 mg/dL (H)).   Recent Labs  06/03/16 0350 06/03/16 0803 06/03/16 1131  GLUCAP 193* 174* 172*    Medications:  Prescriptions Prior to Admission  Medication Sig Dispense Refill Last Dose  . apixaban (ELIQUIS) 5 MG TABS tablet Take 1 tablet (5 mg total) by mouth 2 (two) times daily. 60 tablet 0 06/02/16 at am  . clindamycin (CLEOCIN) 300 MG capsule Take 1 capsule (300 mg total) by mouth 4 (four) times daily. 24 capsule 0 06/02/16 at am  . digoxin (LANOXIN) 0.25 MG tablet Take 1 tablet (0.25 mg total) by mouth daily.   06/02/16 at am  . diltiazem (CARDIZEM CD) 180 MG 24 hr capsule Take 1 capsule (180 mg total) by mouth daily.   06/02/16 at am  . furosemide (LASIX) 40 MG tablet Take 1 tablet (40 mg total) by mouth daily. 30 tablet  06/02/16 at am  . HYDROcodone-acetaminophen (NORCO) 7.5-325 MG tablet Take 1 tablet by mouth every 6 (six) hours as needed for moderate pain. 30 tablet 0 prn at prn  . Insulin Glargine (LANTUS SOLOSTAR) 100 UNIT/ML Solostar Pen Inject 50 Units into the skin at bedtime. 15 mL 11 05/28/2016 at qhs  . levofloxacin (LEVAQUIN) 500 MG tablet Take 1 tablet (500 mg total) by mouth daily. 6 tablet 0 06/02/16 at am  . loperamide (IMODIUM A-D) 2 MG tablet Take 2 mg by mouth every 8 (eight)  hours as needed for diarrhea or loose stools.   prn at prn  . metoprolol (LOPRESSOR) 50 MG tablet Take 1 tablet (50 mg total) by mouth 2 (two) times daily. 180 tablet 3 06/02/16 at am  . norethindrone (AYGESTIN) 5 MG tablet Take 1 tablet (5 mg total) by mouth 3 (three) times daily. 30 tablet 0 06/02/16 at am  . nystatin (MYCOSTATIN/NYSTOP) powder Apply 1 g topically as needed (for rash and redness).   prn at prn  . ondansetron (ZOFRAN) 4 MG tablet Take 1 tablet (4 mg total) by mouth every 6 (six) hours as needed for nausea. 20 tablet 0 prn at prn  . rosuvastatin (CRESTOR) 10 MG tablet Take 1 tablet (10 mg total) by mouth daily after supper. 90 tablet 3 05/28/2016 at pm  . senna-docusate (SENOKOT-S) 8.6-50 MG tablet Take 1 tablet by mouth 2 (two) times daily.   06/02/16 at am    Assessment: 5/24: K @ 18:00 = 3.3   Goal of Therapy:  Normalization of electrolytes   Plan:  KCl 10 mEq IV X 3 ordered to be given around 5/24 @ 22:00. Will recheck electrolytes on 5/25 @ 2:00.   5/25 @ 0256 K 3.6, Mg 1.9. No further supplementation warranted at this time. Will follow up w/ BMP during am labs.  5/26 0816:  K 4.0,  Mag 1.9  Phos 2.5. Electrolytes WNL, No supplementation needed. F/u with next  labs (currently ordered Q6h)  5/26 1428: Electrolytes WNL. No supplementation needed. F/u with next labs (currently ordered Q6h per Nephrology). Patient on CRRT  5/27 02:30 K+ 4.0, Mg 1.9, PO4 2.3. Will hold off replacing PO4 for now since patient is on renal replacement therapy.   5/27 0822 labs: K 4.0, Mag 1.8, Phos 2.0.  Phos level has been trending down. Nephrology following, patient on CRRT. Will order Potassium Phosphate 10 mmol IV x 1. F/u with next labs.  5/27 1422 labs: K 4.0,  Mag 1.8,  Phos now 1.7. KPhos IV ordered this am was not given. Will order Potassium Phosphate 10 mmol IV x 1 (conservative dosing for patient on CRRT). F/u with next labs.   Bari Mantis PharmD Clinical  Pharmacist 06/03/2016

## 2016-06-03 NOTE — Plan of Care (Signed)
Problem: Pain Managment: Goal: General experience of comfort will improve Outcome: Progressing Pain assessment have been performed and patient has remained pain free this shift, continuous analgesic running has not required titration this shift.  Problem: Skin Integrity: Goal: Risk for impaired skin integrity will decrease Outcome: Progressing Patient has been turned every two hours with assistance from bariatric rotation bed and pillow support. Inter Dry was placed between all skin folds per WOCN orders.  Problem: Respiratory: Goal: Ability to maintain a clear airway and adequate ventilation will improve Outcome: Progressing Patient has remained in a 30 degree position in the bed to prevent VAP. Suctioning via ET tube has been provided with little secretions. Patient has maintained adequate ventilation this shift with oxygen saturations and respiratory rates remaining within normal limits.

## 2016-06-03 NOTE — Progress Notes (Signed)
[facility]  ROUNDING NOTE   Subjective:   CRRT 4K bath Norepinephrine gtt Vasopressin gtt  UF 2860  Now with ileus  Objective:  Vital signs in last 24 hours:  Temp:  [97.2 F (36.2 C)-97.7 F (36.5 C)] 97.6 F (36.4 C) (05/27 0720) Pulse Rate:  [83-127] 96 (05/27 0800) Resp:  [17-30] 23 (05/27 0800) BP: (63-150)/(15-91) 122/45 (05/27 0800) SpO2:  [97 %-100 %] 100 % (05/27 0820) FiO2 (%):  [28 %-35 %] 28 % (05/27 0820) Weight:  [166.5 kg (367 lb)] 166.5 kg (367 lb) (05/27 0300)  Weight change: 20.9 kg (46 lb 0.2 oz) Filed Weights   06/01/16 0500 06/02/16 0400 06/03/16 0300  Weight: (!) 144.9 kg (319 lb 7.1 oz) (!) 145.6 kg (320 lb 15.8 oz) (!) 166.5 kg (367 lb)    Intake/Output: I/O last 3 completed shifts: In: 13048.2 [I.V.:2044; Other:20; NG/GT:1017.5; IV Piggyback:9966.7] Out: 8255 [Urine:60; Emesis/NG output:2860; Other:3385; XLKGM:0102]   Intake/Output this shift:  Total I/O In: 348.6 [I.V.:348.6] Out: 97 [Other:97]  Physical Exam: General: Critically ill  Head: ETT NGT  Eyes: Anicteric, PERRL  Neck: RIJ temp HD catheter, left subclavian  Lungs:  Diminished bilaterally, Pressure control FiO2 28%  Heart: irregular  Abdomen:  Soft, nontender, obese  Extremities: + peripheral edema.  Neurologic: Intubated, sedated  Skin: Left foot incision - clean, dry and intact  Access: RIJ temp HD catheter Dr. Alva Garnet 7/25    Basic Metabolic Panel:  Recent Labs Lab 06/02/16 0455  06/02/16 3664 06/02/16 1428 06/02/16 2018 06/03/16 0206 06/03/16 0822  NA  --   < > 136  137 136 136 137 137  K  --   < > 4.0  4.0 4.1 4.0 4.0 4.0  CL  --   < > 104  105 103 103 104 105  CO2  --   < > 24  24 25 23 24 24   GLUCOSE  --   < > 149*  150* 165* 204* 201* 198*  BUN  --   < > 27*  28* 28* 26* 27* 24*  CREATININE  --   < > 1.26*  1.27* 1.24* 1.13* 1.14* 1.08*  CALCIUM  --   < > 8.5*  8.5* 8.8* 8.8* 8.6* 8.8*  MG 1.9  --  1.9 2.1 1.9 1.9  --   PHOS  2.5  < > 2.5 2.6 2.4* 2.3* 2.0*  < > = values in this interval not displayed.  Liver Function Tests:  Recent Labs Lab 05/27/2016 1503 05/30/16 0529  05/31/16 0541  06/01/16 0256  06/02/16 0816 06/02/16 1428 06/02/16 2018 06/03/16 0206 06/03/16 0822  AST 34 44*  --  39  --  47*  --   --   --   --   --   --   ALT 30 29  --  26  --  29  --   --   --   --   --   --   ALKPHOS 89 81  --  82  --  83  --   --   --   --   --   --   BILITOT 3.0* 4.0*  --  2.9*  --  2.9*  --   --   --   --   --   --   PROT 5.3* 4.5*  --  4.5*  --  4.4*  --   --   --   --   --   --  ALBUMIN 2.1* 1.8*  < > 1.9*  < > 1.9*  2.0*  < > 1.9* 2.3* 2.4* 2.5* 2.8*  < > = values in this interval not displayed.  Recent Labs Lab 05/16/2016 1503 05/18/2016 2050  LIPASE 16  --   AMYLASE  --  71   No results for input(s): AMMONIA in the last 168 hours.  CBC:  Recent Labs Lab 05/20/2016 1503 05/30/16 0529 05/31/16 0541 06/01/16 0256 06/02/16 0816  WBC 19.8* 10.3 9.3 4.8 4.5  NEUTROABS 14.0*  --   --   --   --   HGB 14.4 13.4 11.9* 11.3* 10.5*  HCT 44.6 40.2 34.6* 32.4* 30.6*  MCV 95.5 92.9 92.5 92.3 90.3  PLT 438 455* 151 110* 68*    Cardiac Enzymes:  Recent Labs Lab 05/11/2016 1503  TROPONINI 0.04*    BNP: Invalid input(s): POCBNP  CBG:  Recent Labs Lab 06/02/16 1620 06/02/16 1928 06/02/16 2348 06/03/16 0350 06/03/16 0803  GLUCAP 163* 176* 180* 193* 174*    Microbiology: Results for orders placed or performed during the hospital encounter of 05/10/2016  Blood Culture (routine x 2)     Status: None   Collection Time: 05/17/2016  3:03 PM  Result Value Ref Range Status   Specimen Description BLOOD L AC  Final   Special Requests BOTTLES DRAWN AEROBIC AND ANAEROBIC BCAV  Final   Culture NO GROWTH 5 DAYS  Final   Report Status 06/03/2016 FINAL  Final  Blood Culture (routine x 2)     Status: None   Collection Time: 05/20/2016  4:42 PM  Result Value Ref Range Status   Specimen Description BLOOD  RIGHT NECK  Final   Special Requests   Final    BOTTLES DRAWN AEROBIC AND ANAEROBIC Blood Culture results may not be optimal due to an excessive volume of blood received in culture bottles   Culture NO GROWTH 5 DAYS  Final   Report Status 06/03/2016 FINAL  Final  MRSA PCR Screening     Status: None   Collection Time: 05/14/2016  8:35 PM  Result Value Ref Range Status   MRSA by PCR NEGATIVE NEGATIVE Final    Comment:        The GeneXpert MRSA Assay (FDA approved for NASAL specimens only), is one component of a comprehensive MRSA colonization surveillance program. It is not intended to diagnose MRSA infection nor to guide or monitor treatment for MRSA infections.   Urine culture     Status: Abnormal   Collection Time: 05/30/16  1:51 AM  Result Value Ref Range Status   Specimen Description URINE, RANDOM  Final   Special Requests NONE  Final   Culture >=100,000 COLONIES/mL YEAST (A)  Final   Report Status 05/31/2016 FINAL  Final  Culture, respiratory (NON-Expectorated)     Status: None   Collection Time: 05/30/16  2:45 AM  Result Value Ref Range Status   Specimen Description TRACHEAL ASPIRATE  Final   Special Requests NONE  Final   Gram Stain   Final    FEW WBC PRESENT, PREDOMINANTLY MONONUCLEAR MODERATE YEAST FEW GRAM POSITIVE COCCI RARE GRAM VARIABLE ROD Performed at [facility], [address], [place], [place] [address]    Culture   Final    ABUNDANT CANDIDA ALBICANS RARE FUNGUS (MOLD) ISOLATED, PROBABLE CONTAMINANT/COLONIZER (SAPROPHYTE). CONTACT MICROBIOLOGY IF FURTHER IDENTIFICATION REQUIRED [phone].    Report Status 06/02/2016 FINAL  Final  Urine culture     Status: Abnormal   Collection  Time: 06/01/16 12:18 PM  Result Value Ref Range Status   Specimen Description URINE, RANDOM  Final   Special Requests NONE  Final   Culture >=100,000 COLONIES/mL YEAST (A)  Final   Report Status 06/02/2016 FINAL  Final  C difficile quick scan w PCR reflex     Status:  None   Collection Time: 06/01/16 12:18 PM  Result Value Ref Range Status   C Diff antigen NEGATIVE NEGATIVE Final   C Diff toxin NEGATIVE NEGATIVE Final   C Diff interpretation No C. difficile detected.  Final  Culture, respiratory (NON-Expectorated)     Status: None (Preliminary result)   Collection Time: 06/02/16  8:35 AM  Result Value Ref Range Status   Specimen Description TRACHEAL ASPIRATE  Final   Special Requests NONE  Final   Gram Stain   Final    MODERATE WBC PRESENT,BOTH PMN AND MONONUCLEAR MODERATE YEAST Performed at [facility], [place] [address], [place], [place] [address]    Culture PENDING  Incomplete   Report Status PENDING  Incomplete    Coagulation Studies: No results for input(s): LABPROT, INR in the last 72 hours.  Urinalysis:  Recent Labs  06/01/16 1218  COLORURINE AMBER*  LABSPEC 1.026  PHURINE 5.0  GLUCOSEU NEGATIVE  HGBUR LARGE*  BILIRUBINUR SMALL*  KETONESUR 5*  PROTEINUR 100*  NITRITE NEGATIVE  LEUKOCYTESUR SMALL*      Imaging: Ct Abdomen Pelvis Wo Contrast  Result Date: 06/02/2016 CLINICAL DATA:  Generalized abdominal pain. EXAM: CT ABDOMEN AND PELVIS WITHOUT CONTRAST TECHNIQUE: Multidetector CT imaging of the abdomen and pelvis was performed following the standard protocol without IV contrast. COMPARISON:  CT scan of May 29, 2016. FINDINGS: Lower chest: Minimal bilateral posterior basilar subsegmental atelectasis. Hepatobiliary: No focal abnormality is seen in the liver on these unenhanced images. Minimal cholelithiasis is noted. Pancreas: Unremarkable. No pancreatic ductal dilatation or surrounding inflammatory changes. Spleen: Normal in size without focal abnormality. Adrenals/Urinary Tract: Adrenal glands appear normal. No hydronephrosis or renal obstruction is noted. No renal or ureteral calculi are noted. Urinary bladder is decompressed secondary to Foley catheter. Stomach/Bowel: Distal tip of nasogastric tube is seen in distal stomach.  Mild gastric distention is noted. Small bowel dilatation is noted with nondilated terminal ileum, but transition zone is not clearly identified. The appendix appears normal. Rectal tube is noted. Vascular/Lymphatic: Aortic atherosclerosis. No enlarged abdominal or pelvic lymph nodes. Reproductive: Uterus and bilateral adnexa are unremarkable. Other: No abdominal wall hernia or abnormality. No abdominopelvic ascites. Musculoskeletal: Multilevel degenerative disc disease is noted in the lower lumbar spine. IMPRESSION: Minimal cholelithiasis. Aortic atherosclerosis. Distal tip of nasogastric tube is seen in dilated distal stomach. Small bowel dilatation is noted with nondilated terminal ileum, but transition zone is not clearly identified. This is concerning for partial small bowel obstruction. Electronically Signed   By: Marijo Conception, M.D.   On: 06/02/2016 10:41   Dg Chest Port 1 View  Result Date: 06/02/2016 CLINICAL DATA:  Sepsis. EXAM: PORTABLE CHEST 1 VIEW COMPARISON:  Radiograph of Jun 01, 2016. FINDINGS: Stable cardiomediastinal silhouette. Endotracheal and nasogastric tubes are unchanged in position. Right internal jugular and left subclavian catheters are also unchanged in position. No pneumothorax or pleural effusion is noted. No acute pulmonary disease is noted. Bony thorax is unremarkable. IMPRESSION: Stable support apparatus. No acute cardiopulmonary abnormality seen. Electronically Signed   By: Marijo Conception, M.D.   On: 06/02/2016 09:11     Medications:   . albumin human    .  anticoagulant sodium citrate    . ceFEPime (MAXIPIME) IV Stopped (06/03/16 0314)  . dextrose 5 % and 0.45% NaCl 50 mL/hr at 06/02/16 1454  . fentaNYL 100 mcg/hr (06/02/16 1746)  . fluconazole (DIFLUCAN) IV Stopped (06/02/16 2028)  . metronidazole 500 mg (06/03/16 0814)  . norepinephrine (LEVOPHED) Adult infusion 18 mcg/min (06/03/16 0833)  . pureflow 2,000 mL/hr at 06/03/16 0044  . sodium chloride Stopped  (05/30/16 1212)  . vasopressin (PITRESSIN) infusion - *FOR SHOCK* 0.03 Units/min (06/02/16 2220)   . budesonide (PULMICORT) nebulizer solution  0.25 mg Nebulization Q6H  . chlorhexidine gluconate (MEDLINE KIT)  15 mL Mouth Rinse BID  . doxycycline  100 mg Oral Q12H  . feeding supplement (PRO-STAT SUGAR FREE 64)  30 mL Oral BID  . insulin aspart  0-20 Units Subcutaneous Q4H  . ipratropium-albuterol  3 mL Nebulization Q6H  . mouth rinse  15 mL Mouth Rinse 10 times per day  . metoCLOPramide (REGLAN) injection  10 mg Intravenous Q8H  . pantoprazole sodium  40 mg Per Tube Q1200  . sodium chloride flush  10-40 mL Intracatheter Q12H   anticoagulant sodium citrate, bisacodyl, fentaNYL, metoprolol tartrate, midazolam, [DISCONTINUED] ondansetron **OR** ondansetron (ZOFRAN) IV, sennosides, sodium chloride flush  Assessment/ Plan:  Ms. Sravya Grissom is a 61 y.o. white female with diabetes mellitus type II insulin dependent, hypertension, coronary artery disease, hyperlipidemia, peripheral vascular disease, atrial fibrillation who was admitted to [facility] on 05/23/2016  1. Acute renal failure with metabolic acidosis: anuric. Requiring vasopressors (norepinephrine and vasopressin). Baseline 1.1 on 05/18/16 Secondary to ATN from sepsis, hypotension, shock.  - Continue CVVHD 4K bath Therapy rate 2000, BFR 350 UF rate 152m/hr  - Restarted IV albumin  2. Sepsis/shock: requiring vasopressors. Afebrile.  - fluconazole, doxycycline, and cefepime.   3. Acute Respiratory failure requiring mechanical ventilation - Pressure control. FiO2 28%  Discussed case with Dr. CJefferson Fuel Prognosis grim. Discussed case with family. Continue Full Code.    LOS: 5 Laura Kline 5/27/20188:59 AM

## 2016-06-04 DIAGNOSIS — J9601 Acute respiratory failure with hypoxia: Secondary | ICD-10-CM

## 2016-06-04 DIAGNOSIS — N179 Acute kidney failure, unspecified: Secondary | ICD-10-CM

## 2016-06-04 DIAGNOSIS — Z515 Encounter for palliative care: Secondary | ICD-10-CM

## 2016-06-04 DIAGNOSIS — Z66 Do not resuscitate: Secondary | ICD-10-CM

## 2016-06-04 DIAGNOSIS — J9690 Respiratory failure, unspecified, unspecified whether with hypoxia or hypercapnia: Secondary | ICD-10-CM

## 2016-06-04 DIAGNOSIS — Z7189 Other specified counseling: Secondary | ICD-10-CM

## 2016-06-04 LAB — CBC
HCT: 19.3 % — ABNORMAL LOW (ref 35.0–47.0)
Hemoglobin: 6.6 g/dL — ABNORMAL LOW (ref 12.0–16.0)
MCH: 32 pg (ref 26.0–34.0)
MCHC: 34.1 g/dL (ref 32.0–36.0)
MCV: 93.9 fL (ref 80.0–100.0)
PLATELETS: 27 10*3/uL — AB (ref 150–440)
RBC: 2.05 MIL/uL — ABNORMAL LOW (ref 3.80–5.20)
RDW: 14.1 % (ref 11.5–14.5)
WBC: 2 10*3/uL — AB (ref 3.6–11.0)

## 2016-06-04 LAB — RENAL FUNCTION PANEL
ALBUMIN: 2.9 g/dL — AB (ref 3.5–5.0)
ALBUMIN: 3.1 g/dL — AB (ref 3.5–5.0)
ANION GAP: 8 (ref 5–15)
Albumin: 2.8 g/dL — ABNORMAL LOW (ref 3.5–5.0)
Albumin: 2.8 g/dL — ABNORMAL LOW (ref 3.5–5.0)
Anion gap: 9 (ref 5–15)
Anion gap: 10 (ref 5–15)
Anion gap: 8 (ref 5–15)
BUN: 19 mg/dL (ref 6–20)
BUN: 18 mg/dL (ref 6–20)
BUN: 16 mg/dL (ref 6–20)
BUN: 16 mg/dL (ref 6–20)
CALCIUM: 8.5 mg/dL — AB (ref 8.9–10.3)
CALCIUM: 8.9 mg/dL (ref 8.9–10.3)
CHLORIDE: 104 mmol/L (ref 101–111)
CHLORIDE: 107 mmol/L (ref 101–111)
CO2: 23 mmol/L (ref 22–32)
CO2: 21 mmol/L — AB (ref 22–32)
CO2: 25 mmol/L (ref 22–32)
CO2: 23 mmol/L (ref 22–32)
CREATININE: 0.88 mg/dL (ref 0.44–1.00)
CREATININE: 0.91 mg/dL (ref 0.44–1.00)
CREATININE: 0.86 mg/dL (ref 0.44–1.00)
Calcium: 8.8 mg/dL — ABNORMAL LOW (ref 8.9–10.3)
Calcium: 8.7 mg/dL — ABNORMAL LOW (ref 8.9–10.3)
Chloride: 105 mmol/L (ref 101–111)
Chloride: 104 mmol/L (ref 101–111)
Creatinine, Ser: 1.01 mg/dL — ABNORMAL HIGH (ref 0.44–1.00)
GFR calc Af Amer: >60 mL/min (ref >60)
GFR calc non Af Amer: >60 mL/min (ref >60)
GFR calc non Af Amer: 58 mL/min — ABNORMAL LOW (ref >60)
GFR calc non Af Amer: >60 mL/min (ref >60)
GLUCOSE: 182 mg/dL — AB (ref 65–99)
GLUCOSE: 184 mg/dL — AB (ref 65–99)
Glucose, Bld: 216 mg/dL — ABNORMAL HIGH (ref 65–99)
Glucose, Bld: 182 mg/dL — ABNORMAL HIGH (ref 65–99)
PHOSPHORUS: 1.4 mg/dL — AB (ref 2.5–4.6)
POTASSIUM: 4.3 mmol/L (ref 3.5–5.1)
POTASSIUM: 4.2 mmol/L (ref 3.5–5.1)
Phosphorus: 1.7 mg/dL — ABNORMAL LOW (ref 2.5–4.6)
Phosphorus: 1.8 mg/dL — ABNORMAL LOW (ref 2.5–4.6)
Phosphorus: 1.4 mg/dL — ABNORMAL LOW (ref 2.5–4.6)
Potassium: 4 mmol/L (ref 3.5–5.1)
Potassium: 4.1 mmol/L (ref 3.5–5.1)
SODIUM: 137 mmol/L (ref 135–145)
SODIUM: 137 mmol/L (ref 135–145)
Sodium: 135 mmol/L (ref 135–145)
Sodium: 138 mmol/L (ref 135–145)

## 2016-06-04 LAB — CULTURE, RESPIRATORY W GRAM STAIN

## 2016-06-04 LAB — GLUCOSE, CAPILLARY
GLUCOSE-CAPILLARY: 168 mg/dL — AB (ref 65–99)
GLUCOSE-CAPILLARY: 179 mg/dL — AB (ref 65–99)
GLUCOSE-CAPILLARY: 186 mg/dL — AB (ref 65–99)
GLUCOSE-CAPILLARY: 197 mg/dL — AB (ref 65–99)
GLUCOSE-CAPILLARY: 202 mg/dL — AB (ref 65–99)
Glucose-Capillary: 170 mg/dL — ABNORMAL HIGH (ref 65–99)
Glucose-Capillary: 183 mg/dL — ABNORMAL HIGH (ref 65–99)

## 2016-06-04 LAB — MAGNESIUM
MAGNESIUM: 1.9 mg/dL (ref 1.7–2.4)
Magnesium: 1.6 mg/dL — ABNORMAL LOW (ref 1.7–2.4)
Magnesium: 2.1 mg/dL (ref 1.7–2.4)
Magnesium: 1.8 mg/dL (ref 1.7–2.4)

## 2016-06-04 LAB — CULTURE, RESPIRATORY

## 2016-06-04 LAB — APTT: aPTT: 130 seconds — ABNORMAL HIGH (ref 24–36)

## 2016-06-04 MED ORDER — MAGNESIUM SULFATE 2 GM/50ML IV SOLN
2.0000 g | Freq: Once | INTRAVENOUS | Status: AC
  Administered 2016-06-04: 2 g via INTRAVENOUS
  Filled 2016-06-04: qty 50

## 2016-06-04 MED ORDER — HEPARIN SODIUM (PORCINE) 5000 UNIT/ML IJ SOLN: 5000.0000 [IU] | Freq: Three times a day (TID) | INTRAMUSCULAR | Status: DC

## 2016-06-04 NOTE — Progress Notes (Signed)
Spoke with Dr Darnelle Catalanama. Platelet count 27. Orders not to give heparin sub Q. No additional orders at this time.

## 2016-06-04 NOTE — Care Management (Signed)
LTAC screening requested from [facility] and [facility] however LTAC cannot serve CRRT patients. RNCM will continue to follow.

## 2016-06-04 NOTE — Progress Notes (Signed)
Pharmacy Antibiotic Note/CRRT Medication Adjusment  Laura Kline is a 61 y.o. female with a recent I&D of left foot abscess admitted on 05/19/2016 with sepsis/unclear source.  Pharmacy has been consulted for vancomycin and cefepime and Fluconazole dosing.   Patient on CRRT for acute renal failure. Patient is also ordered doxycycline and metronidazole per ID.   Vancomycin d/c 5/25 per ID.  Plan: 1. ABX: Will continue cefepime 2 g iv q 12 hours for CRRT Will continue Fluconazole 400 mg IV Q24h  2. Aside from antibiotics, no medications require adjustment for CRRT at present.      Height: 5' (152.4 cm) Weight: (!) 361 lb (163.7 kg) IBW/kg (Calculated) : 45.5  Temp (24hrs), Avg:97.7 F (36.5 C), Min:97.5 F (36.4 C), Max:97.9 F (36.6 C)   Recent Labs Lab 05/09/2016 1503 05/26/2016 1900  05/30/16 0529  05/31/16 0541  06/01/16 0256  06/01/16 2010  06/02/16 0430  06/02/16 0816  06/03/16 0206 06/03/16 0822 06/03/16 1422 06/03/16 2020 06/04/16 0229  WBC 19.8*  --   --  10.3  --  9.3  --  4.8  --   --   --   --   --  4.5  --   --   --   --   --   --   CREATININE 3.42*  --   < > 3.07*  < > 2.55*  < > 1.75*  1.79*  < > 1.46*  < >  --   < > 1.26*  1.27*  < > 1.14* 1.08* 1.01* 0.94 0.88  LATICACIDVEN 4.8* 2.1*  --   --   --   --   --   --   --   --   --  3.3*  --   --   --   --   --   --   --   --   VANCORANDOM  --   --   --   --   --   --   --   --   --  10  --   --   --   --   --   --   --   --   --   --   < > = values in this interval not displayed.  Estimated Creatinine Clearance: 97.1 mL/min (by C-G formula based on SCr of 0.88 mg/dL).    Allergies  Allergen Reactions  . Rofecoxib Other (See Comments)    Reaction: unknown  . Penicillins Rash and Other (See Comments)    Has patient had a PCN reaction causing immediate rash, facial/tongue/throat swelling, SOB or lightheadedness with hypotension: Unknown Has patient had a PCN reaction causing severe rash involving  mucus membranes or skin necrosis: Unknown Has patient had a PCN reaction that required hospitalization: Unknown Has patient had a PCN reaction occurring within the last 10 years: Unknown If all of the above answers are "NO", then may proceed with Cephalosporin use.     Antimicrobials this admission: vanc 5/22 >>  levofloxacin 5/22 >> 5/23 Cefepime 5/22 >> Metronidazole 5/23 >> Doxycycline >>  Dose adjustments this admission:  Microbiology results: BCx: NGTD TA: abundant yeast, rare mold 5/23 UCx: yeast 5/25 Ucx: yeast  MRSA PCR: negative C diff: sent GI PCR: sent  Thank you for allowing pharmacy to be a part of this patient's care.  Carlen Rebuck D Clinical Pharmacist  06/04/2016 8:00 AM

## 2016-06-04 NOTE — Progress Notes (Signed)
Sedation stopped this am. Towards the afternoon patient was biting down on tube and becoming agitated. Restarted fentanyl at 100 mcg. Patient unable to follow simple commands. At this point no orders to transfuse blood or platelets per Dr Darnelle Catalanama. Palliative care on board and patient has been made a DNR. NG, rectal tube and foley in place. No temps throughout the day. Family at bedside.

## 2016-06-04 NOTE — Progress Notes (Signed)
[facility]  ROUNDING NOTE   Subjective:   CVVHD 4K bath Off Norepinephrine gtt Vasopressin gtt  UF 2123 net -908  Palliative care consult today.   Kphos and mag sulfate given   Objective:  Vital signs in last 24 hours:  Temp:  [97.5 F (36.4 C)-97.9 F (36.6 C)] 97.5 F (36.4 C) (05/28 0400) Pulse Rate:  [93-117] 98 (05/28 0800) Resp:  [15-31] 28 (05/28 0800) BP: (83-161)/(23-140) 120/56 (05/28 0800) SpO2:  [94 %-100 %] 94 % (05/28 0800) FiO2 (%):  [28 %] 28 % (05/28 0742) Weight:  [163.7 kg (361 lb)] 163.7 kg (361 lb) (05/28 0500)  Weight change: -2.722 kg (-6 lb) Filed Weights   06/02/16 0400 06/03/16 0300 06/04/16 0500  Weight: (!) 145.6 kg (320 lb 15.8 oz) (!) 166.5 kg (367 lb) (!) 163.7 kg (361 lb)    Intake/Output: I/O last 3 completed shifts: In: 13337.2 [I.V.:3277.2; NG/GT:90; IV NGEXBMWUX:3244] Out: 0102 [Urine:114; Emesis/NG output:2350; VOZDG:6440; Stool:975]   Intake/Output this shift:  Total I/O In: -  Out: 97 [Other:97]  Physical Exam: General: Critically ill  Head: ETT NGT  Eyes: Anicteric, PERRL  Neck: RIJ temp HD catheter, left subclavian  Lungs:  Diminished bilaterally, Pressure control FiO2 28%  Heart: irregular  Abdomen:  Soft, nontender, obese  Extremities: + peripheral edema.  Neurologic: Intubated, sedated  Skin: Left foot incision - clean, dry and intact  Access: RIJ temp HD catheter Dr. Alva Garnet 3/47    Basic Metabolic Panel:  Recent Labs Lab 06/03/16 0206 06/03/16 4259 06/03/16 1422 06/03/16 2020 06/04/16 0229 06/04/16 0817  NA 137 137 136 135 137  --   K 4.0 4.0 4.0 4.2 4.0  --   CL 104 105 104 102 105  --   CO2 _0 --   GLUCOSE 201* 198* 194* 199* 182*  --   BUN 27* 24* 22* 20 19  --   CREATININE 1.14* 1.08* 1.01* 0.94 0.88  --   CALCIUM 8.6* 8.8* 8.7* 8.8* 8.5*  --   MG 1.9 1.8 1.8 1.7 1.6* 2.1  PHOS 2.3* 2.0* 1.7* 1.9* 1.7*  --     Liver Function Tests:  Recent Labs Lab  05/13/2016 1503 05/30/16 0529  05/31/16 0541  06/01/16 0256  06/03/16 0822 06/03/16 1422 06/03/16 1502 06/03/16 2020 06/04/16 0229  AST 34 44*  --  39  --  47*  --   --   --  31  --   --   ALT 30 29  --  26  --  29  --   --   --  20  --   --   ALKPHOS 89 81  --  82  --  83  --   --   --  78  --   --   BILITOT 3.0* 4.0*  --  2.9*  --  2.9*  --   --   --  4.5*  --   --   PROT 5.3* 4.5*  --  4.5*  --  4.4*  --   --   --  4.6*  --   --   ALBUMIN 2.1* 1.8*  < > 1.9*  < > 1.9*  2.0*  < > 2.8* 2.8* 2.8* 2.8* 2.8*  < > = values in this interval not displayed.  Recent Labs Lab 06/01/2016 1503 05/15/2016 2050  LIPASE 16  --   AMYLASE  --  71    Recent Labs Lab  06/03/16 1502  AMMONIA 13    CBC:  Recent Labs Lab 05/28/2016 1503 05/30/16 0529 05/31/16 0541 06/01/16 0256 06/02/16 0816  WBC 19.8* 10.3 9.3 4.8 4.5  NEUTROABS 14.0*  --   --   --   --   HGB 14.4 13.4 11.9* 11.3* 10.5*  HCT 44.6 40.2 34.6* 32.4* 30.6*  MCV 95.5 92.9 92.5 92.3 90.3  PLT 438 455* 151 110* 68*    Cardiac Enzymes:  Recent Labs Lab 05/30/2016 1503  TROPONINI 0.04*    BNP: Invalid input(s): POCBNP  CBG:  Recent Labs Lab 06/03/16 1550 06/03/16 2004 06/04/16 0007 06/04/16 0407 06/04/16 0715  GLUCAP 172* 202* 170* 183* 197*    Microbiology: Results for orders placed or performed during the hospital encounter of 06/01/2016  Blood Culture (routine x 2)     Status: None   Collection Time: 05/08/2016  3:03 PM  Result Value Ref Range Status   Specimen Description BLOOD L AC  Final   Special Requests BOTTLES DRAWN AEROBIC AND ANAEROBIC BCAV  Final   Culture NO GROWTH 5 DAYS  Final   Report Status 06/03/2016 FINAL  Final  Blood Culture (routine x 2)     Status: None   Collection Time: 05/28/2016  4:42 PM  Result Value Ref Range Status   Specimen Description BLOOD RIGHT NECK  Final   Special Requests   Final    BOTTLES DRAWN AEROBIC AND ANAEROBIC Blood Culture results may not be optimal due to an  excessive volume of blood received in culture bottles   Culture NO GROWTH 5 DAYS  Final   Report Status 06/03/2016 FINAL  Final  MRSA PCR Screening     Status: None   Collection Time: 06/01/2016  8:35 PM  Result Value Ref Range Status   MRSA by PCR NEGATIVE NEGATIVE Final    Comment:        The GeneXpert MRSA Assay (FDA approved for NASAL specimens only), is one component of a comprehensive MRSA colonization surveillance program. It is not intended to diagnose MRSA infection nor to guide or monitor treatment for MRSA infections.   Urine culture     Status: Abnormal   Collection Time: 05/30/16  1:51 AM  Result Value Ref Range Status   Specimen Description URINE, RANDOM  Final   Special Requests NONE  Final   Culture >=100,000 COLONIES/mL YEAST (A)  Final   Report Status 05/31/2016 FINAL  Final  Culture, respiratory (NON-Expectorated)     Status: None   Collection Time: 05/30/16  2:45 AM  Result Value Ref Range Status   Specimen Description TRACHEAL ASPIRATE  Final   Special Requests NONE  Final   Gram Stain   Final    FEW WBC PRESENT, PREDOMINANTLY MONONUCLEAR MODERATE YEAST FEW GRAM POSITIVE COCCI RARE GRAM VARIABLE ROD Performed at [facility], [address] [place], [place] [address]    Culture   Final    ABUNDANT CANDIDA ALBICANS RARE FUNGUS (MOLD) ISOLATED, PROBABLE CONTAMINANT/COLONIZER (SAPROPHYTE). CONTACT MICROBIOLOGY IF FURTHER IDENTIFICATION REQUIRED [phone].    Report Status 06/02/2016 FINAL  Final  Urine culture     Status: Abnormal   Collection Time: 06/01/16 12:18 PM  Result Value Ref Range Status   Specimen Description URINE, RANDOM  Final   Special Requests NONE  Final   Culture >=100,000 COLONIES/mL YEAST (A)  Final   Report Status 06/02/2016 FINAL  Final  C difficile quick scan w PCR reflex     Status: None  Collection Time: 06/01/16 12:18 PM  Result Value Ref Range Status   C Diff antigen NEGATIVE NEGATIVE Final   C Diff toxin  NEGATIVE NEGATIVE Final   C Diff interpretation No C. difficile detected.  Final  Culture, respiratory (NON-Expectorated)     Status: None (Preliminary result)   Collection Time: 06/02/16  8:35 AM  Result Value Ref Range Status   Specimen Description TRACHEAL ASPIRATE  Final   Special Requests NONE  Final   Gram Stain   Final    MODERATE WBC PRESENT,BOTH PMN AND MONONUCLEAR MODERATE YEAST Performed at [facility], [place] [address], [place], [place] [address]    Culture FEW CANDIDA ALBICANS  Final   Report Status PENDING  Incomplete    Coagulation Studies: No results for input(s): LABPROT, INR in the last 72 hours.  Urinalysis:  Recent Labs  06/01/16 1218  COLORURINE AMBER*  LABSPEC 1.026  PHURINE 5.0  GLUCOSEU NEGATIVE  HGBUR LARGE*  BILIRUBINUR SMALL*  KETONESUR 5*  PROTEINUR 100*  NITRITE NEGATIVE  LEUKOCYTESUR SMALL*      Imaging: Ct Abdomen Pelvis Wo Contrast  Result Date: 06/02/2016 CLINICAL DATA:  Generalized abdominal pain. EXAM: CT ABDOMEN AND PELVIS WITHOUT CONTRAST TECHNIQUE: Multidetector CT imaging of the abdomen and pelvis was performed following the standard protocol without IV contrast. COMPARISON:  CT scan of May 29, 2016. FINDINGS: Lower chest: Minimal bilateral posterior basilar subsegmental atelectasis. Hepatobiliary: No focal abnormality is seen in the liver on these unenhanced images. Minimal cholelithiasis is noted. Pancreas: Unremarkable. No pancreatic ductal dilatation or surrounding inflammatory changes. Spleen: Normal in size without focal abnormality. Adrenals/Urinary Tract: Adrenal glands appear normal. No hydronephrosis or renal obstruction is noted. No renal or ureteral calculi are noted. Urinary bladder is decompressed secondary to Foley catheter. Stomach/Bowel: Distal tip of nasogastric tube is seen in distal stomach. Mild gastric distention is noted. Small bowel dilatation is noted with nondilated terminal ileum, but transition zone is not  clearly identified. The appendix appears normal. Rectal tube is noted. Vascular/Lymphatic: Aortic atherosclerosis. No enlarged abdominal or pelvic lymph nodes. Reproductive: Uterus and bilateral adnexa are unremarkable. Other: No abdominal wall hernia or abnormality. No abdominopelvic ascites. Musculoskeletal: Multilevel degenerative disc disease is noted in the lower lumbar spine. IMPRESSION: Minimal cholelithiasis. Aortic atherosclerosis. Distal tip of nasogastric tube is seen in dilated distal stomach. Small bowel dilatation is noted with nondilated terminal ileum, but transition zone is not clearly identified. This is concerning for partial small bowel obstruction. Electronically Signed   By: Marijo Conception, M.D.   On: 06/02/2016 10:41   Dg Chest Port 1 View  Result Date: 06/03/2016 CLINICAL DATA:  Vent EXAM: PORTABLE CHEST 1 VIEW COMPARISON:  06/02/2016 FINDINGS: Endotracheal tube terminates 3 cm above the carina. Mild patchy left lower lobe opacity, likely atelectasis. Pulmonary vascular congestion. No frank interstitial edema. No pleural effusion or pneumothorax. The heart is top-normal in size for inspiration. IMPRESSION: Endotracheal tube terminates 3 cm above the carina. Mild patchy left lower lobe opacity, likely atelectasis. Electronically Signed   By: Julian Hy M.D.   On: 06/03/2016 09:26     Medications:   . albumin human    . anticoagulant sodium citrate    . ceFEPime (MAXIPIME) IV Stopped (06/04/16 0406)  . dextrose 5 % and 0.45% NaCl 50 mL/hr at 06/04/16 0600  . fentaNYL Stopped (06/04/16 0700)  . fluconazole (DIFLUCAN) IV Stopped (06/03/16 2006)  . metronidazole 500 mg (06/04/16 6063)  . norepinephrine (LEVOPHED) Adult infusion Stopped (  06/04/16 0830)  . potassium phosphate IVPB (mmol) 10 mmol (06/04/16 0618)  . pureflow 3 each (06/04/16 9937)  . sodium chloride Stopped (05/30/16 1212)  . vasopressin (PITRESSIN) infusion - *FOR SHOCK* 0.03 Units/min (06/04/16 0600)    . budesonide (PULMICORT) nebulizer solution  0.25 mg Nebulization Q6H  . chlorhexidine gluconate (MEDLINE KIT)  15 mL Mouth Rinse BID  . doxycycline  100 mg Oral Q12H  . feeding supplement (PRO-STAT SUGAR FREE 64)  30 mL Oral BID  . insulin aspart  0-20 Units Subcutaneous Q4H  . ipratropium-albuterol  3 mL Nebulization Q6H  . mouth rinse  15 mL Mouth Rinse 10 times per day  . metoCLOPramide (REGLAN) injection  10 mg Intravenous Q8H  . pantoprazole sodium  40 mg Per Tube Q1200  . sodium chloride flush  10-40 mL Intracatheter Q12H   anticoagulant sodium citrate, bisacodyl, fentaNYL, metoprolol tartrate, midazolam, [DISCONTINUED] ondansetron **OR** ondansetron (ZOFRAN) IV, sennosides, sodium chloride flush  Assessment/ Plan:  Ms. Laura Kline is a 61 y.o. white female with diabetes mellitus type II insulin dependent, hypertension, coronary artery disease, hyperlipidemia, peripheral vascular disease, atrial fibrillation who was admitted to [facility] on 05/22/2016  1. Acute renal failure with metabolic acidosis: anuric. Requiring vasopressors (norepinephrine and vasopressin). Baseline 1.1 on 05/18/16 Secondary to ATN from sepsis, hypotension, shock.  - Continue CVVHD 4K bath Therapy rate 2000, BFR 350 UF rate 150m/hr  - Restarted IV albumin - Patient does not show any signs of renal recovery.   2. Sepsis/shock: requiring vasopressors. Afebrile.  Weaned off norepinephrine this morning - Attempt to wean off vasopressin.  - fluconazole, doxycycline, metronidazole and cefepime.  - Appreciate ID input.   3. Acute Respiratory failure requiring mechanical ventilation - Pressure control. FiO2 28%  Discussed case with Dr. RAshby Dawesand Palliative care.     LOS: 6 Davis Vannatter 5/28/20189:08 AM

## 2016-06-04 NOTE — Progress Notes (Signed)
[facility] [place] [facility]   PATIENT NAME: Laura Kline    MR#:  [id number]  DATE OF BIRTH:  05/14/1955  SUBJECTIVE:    Remains critically ill On CRRT 2 vasopressors.  Afebrile Anuric - 75 ml UOP Sedated  TFs on hold due to ileus  REVIEW OF SYSTEMS:    Review of Systems  Unable to perform ROS: Intubated   DRUG ALLERGIES:   Allergies  Allergen Reactions  . Rofecoxib Other (See Comments)    Reaction: unknown  . Penicillins Rash and Other (See Comments)    Has patient had a PCN reaction causing immediate rash, facial/tongue/throat swelling, SOB or lightheadedness with hypotension: Unknown Has patient had a PCN reaction causing severe rash involving mucus membranes or skin necrosis: Unknown Has patient had a PCN reaction that required hospitalization: Unknown Has patient had a PCN reaction occurring within the last 10 years: Unknown If all of the above answers are "NO", then may proceed with Cephalosporin use.     VITALS:  Blood pressure (!) 102/48, pulse (!) 102, temperature 97.5 F (36.4 C), temperature source Oral, resp. rate (!) 23, height 5' (1.524 m), weight (!) 163.7 kg (361 lb), SpO2 97 %.  PHYSICAL EXAMINATION:   Physical Exam  GENERAL:  61 y.o.-year-old obese patient lying in bed critically ill appearing.  EYES: Pupils equal, round, reactive to light. No scleral icterus.  HEENT: Head atraumatic, normocephalic. Oropharynx and nasopharynx clear. ET and OG tube in place.  NECK:  Supple, no jugular venous distention. No thyroid enlargement, no tenderness.  LUNGS: Normal breath sounds bilaterally, no wheezing, rales, rhonchi. No use of accessory muscles of respiration.  CARDIOVASCULAR: S1, S2 normal.  ABDOMEN: Soft, mild diffuse tenderness, nondistended.  EXTREMITIES: No cyanosis, clubbing, +1-2 edema b/l.    NEUROLOGIC: sedated & Intubated but follows simple commands.  PSYCHIATRIC: Sedated and intubated.  SKIN: No obvious rash,  lesion, LLE ulcer with no acute drainage.  Ulcer located in between the first & 2nd Toes.  Dressing   LABORATORY PANEL:   CBC  Recent Labs Lab 06/02/16 0816  WBC 4.5  HGB 10.5*  HCT 30.6*  PLT 68*   ------------------------------------------------------------------------------------------------------------------  Chemistries   Recent Labs Lab 06/03/16 1502  06/04/16 0817  NA  --   < > 135  K  --   < > 4.3  CL  --   < > 104  CO2  --   < > 21*  GLUCOSE  --   < > 216*  BUN  --   < > 18  CREATININE  --   < > 1.01*  CALCIUM  --   < > 8.8*  MG  --   < > 2.1  AST 31  --   --   ALT 20  --   --   ALKPHOS 78  --   --   BILITOT 4.5*  --   --   < > = values in this interval not displayed. ------------------------------------------------------------------------------------------------------------------  Cardiac Enzymes  Recent Labs Lab 05/25/2016 1503  TROPONINI 0.04*   ------------------------------------------------------------------------------------------------------------------  RADIOLOGY:  Dg Chest Port 1 View  Result Date: 06/03/2016 CLINICAL DATA:  Vent EXAM: PORTABLE CHEST 1 VIEW COMPARISON:  06/02/2016 FINDINGS: Endotracheal tube terminates 3 cm above the carina. Mild patchy left lower lobe opacity, likely atelectasis. Pulmonary vascular congestion. No frank interstitial edema. No pleural effusion or pneumothorax. The heart is top-normal in size for inspiration. IMPRESSION: Endotracheal tube terminates 3 cm above the carina. Mild  patchy left lower lobe opacity, likely atelectasis. Electronically Signed   By: Charline BillsSriyesh  Krishnan M.D.   On: 06/03/2016 09:26     ASSESSMENT AND PLAN:   61 year old female with past medical history of peripheral vascular disease, morbid obesity, insulin-dependent diabetes, hypertension, hyperlipidemia, essential hypertension, recent admission for left lower extremity cellulitis and acute kidney injury of presented to the hospital due to  altered mental status and noted to be in septic shock.  * Sepsis with septic shock-source unclear but suspected to be due to left lower extremity ulcer/cellulitis. -Seen by infectious disease and continue IV cefepime, Flagyl and doxycycline. Fluconazole added.  cultures so far (-).  - Urine growing Yeast but likely colonized. On fluconazole.  * Septic shock- now back on 2 vasopressors with Levophed, Vasopressin. -continue vasopressors. Continue empiric antibiotics as mentioned above.  * Acute respiratory failure-patient intubated due to obtundation and altered mental status and unable to maintain her airway -Continue vent support  on 30% FiO2 and wean as per pulmonary/intensivist.  * Acute renal failure - Anuric-secondary to sepsis and septic shock. - seen by nephrology, cont. CRRT as pt. Remains Anuric.   * Metabolic acidosis-secondary to septic shock and renal failure. - On CRRT  * Diabetes type 2 without complication Insulin gtt  * Acute toxic metabolic metabolic encephalopathy secondary to septic shock and ARF  Following simple commands.   * Partial SBO on CT abdomen Reglan. TFs held for now  Patient is critically ill with multiorgan failure. Prognosis is poor. Paliative care consult ordered.   Discussed with Dr. Nicholos Johnsamachandran and Clerance LavMarianne with palliative care  All the records are reviewed and case discussed with Care Management/Social Worker. Management plans discussed with the patient, family and they are in agreement.  CODE STATUS: Full  DVT Prophylaxis: Hep. SQ  TOTAL TIME TAKING CARE OF THIS PATIENT: 25 minutes  Milagros LollSudini, Mccade Sullenberger R M.D on 06/04/2016 at 10:23 AM  Between 7am to 6pm - Pager - [phone]  After 6pm go to [email or web address] - Social research officer, governmentpassword [email or web address]  [facility]  Office  [phone]  CC: Primary care physician; Patient, No Pcp Per

## 2016-06-04 NOTE — Progress Notes (Signed)
eLink Physician-Brief Progress Note Patient Name: Laura RootsSabrina [name] Kline DOB: 04/25/1955 MRN: [id number]   Date of Service  06/04/2016  HPI/Events of Note    Lab Results  Component Value Date   HGB 6.6 (L) 06/04/2016   HGB 10.5 (L) 06/02/2016   HGB 11.3 (L) 06/01/2016     eICU Interventions  Pt full ncb/ fm not wishing to escalate care. Discussed with Dr Linward Natalamachandra > no need to tx as approaching futile care      Intervention Category Major Interventions: Hemorrhage - evaluation and management  Sandrea HughsMichael Marne Meline 06/04/2016, 4:21 PM

## 2016-06-04 NOTE — Consult Note (Signed)
Consultation Note Date: 06/04/2016   Patient Name: Laura Kline  DOB: 1955/06/09  MRN: [id number]  Age / Sex: 61 y.o., female  PCP: Patient, No Pcp Per Referring Physician: Hillary Bow, MD  Reason for Consultation: Establishing goals of care  HPI/Patient Profile: 61 y.o. female  with past medical history of Afib (on eliquis) PVD, CAD, IDDM, Bells, morbid obesity, recent foot surgery in 05/2016 who was admitted on 06/03/2016 with septic shock (source unclear),and acute renal failure.  Since admission she has developed hyperbilirubinemia, and ileus vs partial SBO.  I discussed her case with Nephrology, CCM and bedside RN.  The patient has not made urine since admission.  She is having significant diarrhea.  She has a small amount of blood in her foley as well as in her rectal tube.  A large amount (800 ml) of liquid was suctioned from her O/G last night.  Per the family her eyes are beginning to yellow.  Clinical Assessment and Goals of Care:  I have reviewed medical records including EPIC notes, labs and imaging, received report from the care team, assessed the patient and then met at the bedside along with her husband, Laura Kline, and a close family friend "adopted daughter"  to discuss diagnosis prognosis, [place], EOL wishes, disposition and options.  I introduced Palliative Medicine as specialized medical care for people living with serious illness. It focuses on providing relief from the symptoms and stress of a serious illness. The goal is to improve quality of life for both the patient and the family.  We discussed a brief life review of the patient. Her husband describes her as a home-body.  Once a week she would go with him to [place] and the grocery store.  Other than that she would not leave the house.  She stayed at home and watched TV (racing, baseball and My Little Pony).  Laura Kline and [name] have two sons  and two grand children.    John described [name]'s initial illness - this just started about 1 month ago.  She stepped on a piece of iron that was likely rusted and had coolant on it.  She quickly developed an infection and declined.  She left the hospital and went briefly to rehab.  She told Laura Kline she did not want to be in rehab - she wanted to be at home.  Laura Kline feels quite confident that his wife would not want to live in a facility.  We discussed her current illness including septic shock, respiratory failure, renal failure, the beginning of liver failure, worsening diarrhea and bowel obstruction on top of the recent unexplained vaginal bleeding she had last hospitalization.  Laura Kline understands that his wife has been her and intubated for 1 week and that she is not improving but rather declining.  He states he has seen a lot of critical illness and death in his life and he feels his wife is dying.  We discussed the possibility of trach/PEG/hemodialysis if we were fortunate enough to be able to get [facility] of pressors  and she woke up.  Laura Kline felt that if his wife could not live at home she would not want to be kept alive.   Per Laura Kline "there is a big difference between living and being kept alive".  It was evident this conversation was very painful for [employer].  We discussed code status and Laura Kline confirmed DNR.  He reports having discussed this with his sons and CCM previously.  He asked questions about prognosis with and without life support.  I told him my best guess is that she would pass in less than 24 hours without life support and that it would not surprised me if she only lasted days with life support - she is at high risk for an acute event.  Questions and concerns were addressed.  Hard Choices booklet left for review. The family was encouraged to call with questions or concerns.  PMT will continue to support holistically.  Primary Decision Maker:  NEXT OF KIN husband Laura Kline (call his mobile number not the  home phone)    SUMMARY OF RECOMMENDATIONS    Code Status/Advance Care Planning:  DNR   Do not further escalate care   Symptom Management:   Per CCM  Additional Recommendations (Limitations, Scope, Preferences):  Do not further escalate care.   Palliative Prophylaxis:   Frequent Pain Assessment   Prognosis:   Hours - Days  Discharge Planning: Anticipated Hospital Death.  I anticipate that on 04-Apr-2022 the family will opt for full comfort if the patient has not had significant improvement.  I do not believe they will opt for trach/peg/HD      Primary Diagnoses: Present on Admission: . Septic shock ([place])   I have reviewed the medical record, interviewed the patient and family, and examined the patient. The following aspects are pertinent.  Past Medical History:  Diagnosis Date  . Coronary atherosclerosis    LAD calcifications noted on CT scan in 2016 with negative nuclear stress test.  . DKA (diabetic ketoacidoses) ([place])    a. 05/2009  . Essential hypertension   . History of Bell's palsy   . Hyperlipidemia   . Hypertension   . Hypokalemia   . IDDM (insulin dependent diabetes mellitus) ([place])   . Long-term insulin use ([place])   . Morbid obesity ([place])   . Peripheral vascular disease [facility])    Social History   Social History  . Marital status: Married    Spouse name: N/A  . Number of children: N/A  . Years of education: N/A   Social History Main Topics  . Smoking status: Never Smoker  . Smokeless tobacco: Never Used  . Alcohol use No  . Drug use: No  . Sexual activity: Yes   Other Topics Concern  . None   Social History Narrative  . None   Family History  Problem Relation Age of Onset  . Hypertension Mother   . Hypertension Father   . CAD Father        s/p cabg  . Heart attack Father   . Heart failure Father    Scheduled Meds: . budesonide (PULMICORT) nebulizer solution  0.25 mg Nebulization Q6H  . chlorhexidine gluconate (MEDLINE  KIT)  15 mL Mouth Rinse BID  . doxycycline  100 mg Oral Q12H  . feeding supplement (PRO-STAT SUGAR FREE 64)  30 mL Oral BID  . insulin aspart  0-20 Units Subcutaneous Q4H  . ipratropium-albuterol  3 mL Nebulization Q6H  . mouth rinse  15 mL Mouth Rinse 10 times  per day  . metoCLOPramide (REGLAN) injection  10 mg Intravenous Q8H  . pantoprazole sodium  40 mg Per Tube Q1200  . sodium chloride flush  10-40 mL Intracatheter Q12H   Continuous Infusions: . albumin human    . anticoagulant sodium citrate    . ceFEPime (MAXIPIME) IV Stopped (06/04/16 0406)  . dextrose 5 % and 0.45% NaCl 50 mL/hr at 06/04/16 0600  . fentaNYL Stopped (06/04/16 0700)  . fluconazole (DIFLUCAN) IV Stopped (06/03/16 2006)  . metronidazole 500 mg (06/04/16 7262)  . norepinephrine (LEVOPHED) Adult infusion Stopped (06/04/16 0830)  . potassium phosphate IVPB (mmol) 10 mmol (06/04/16 0618)  . pureflow 3 each (06/04/16 0355)  . sodium chloride Stopped (05/30/16 1212)  . vasopressin (PITRESSIN) infusion - *FOR SHOCK* 0.03 Units/min (06/04/16 0600)   PRN Meds:.anticoagulant sodium citrate, bisacodyl, fentaNYL, metoprolol tartrate, midazolam, [DISCONTINUED] ondansetron **OR** ondansetron (ZOFRAN) IV, sennosides, sodium chloride flush Allergies  Allergen Reactions  . Rofecoxib Other (See Comments)    Reaction: unknown  . Penicillins Rash and Other (See Comments)    Has patient had a PCN reaction causing immediate rash, facial/tongue/throat swelling, SOB or lightheadedness with hypotension: Unknown Has patient had a PCN reaction causing severe rash involving mucus membranes or skin necrosis: Unknown Has patient had a PCN reaction that required hospitalization: Unknown Has patient had a PCN reaction occurring within the last 10 years: Unknown If all of the above answers are "NO", then may proceed with Cephalosporin use.    Review of Systems patient intubated and unable to speak  Physical Exam   Well developed obese  female, intubated.  Tremoring,  Moves left foot to touch but does not respond to commands. CV rrr resp intubated with mildly coarse breath sounds Abdomen no frank bowel sounds, ND, NT   Vital Signs: BP (!) 120/56   Pulse 98   Temp 97.5 F (36.4 C) (Oral)   Resp (!) 28   Ht 5' (1.524 m)   Wt (!) 163.7 kg (361 lb)   SpO2 94%   BMI 70.50 kg/m  Pain Assessment: CPOT POSS *See Group Information*: 2-Acceptable,Slightly drowsy, easily aroused Pain Score: 0-No pain   SpO2: SpO2: 94 % O2 Device:SpO2: 94 % O2 Flow Rate: .O2 Flow Rate (L/min): 15 L/min  IO: Intake/output summary:  Intake/Output Summary (Last 24 hours) at 06/04/16 0853 Last data filed at 06/04/16 0800  Gross per 24 hour  Intake          3179.66 ml  Output             4438 ml  Net         -1258.34 ml    LBM: Last BM Date: 06/04/16 Baseline Weight: Weight: 136.1 kg (300 lb) Most recent weight: Weight: (!) 163.7 kg (361 lb)     Palliative Assessment/Data:   Flowsheet Rows     Most Recent Value  Intake Tab  Referral Department  Critical care  Unit at Time of Referral  ICU  Palliative Care Primary Diagnosis  Sepsis/Infectious Disease  Date Notified  06/02/16  Palliative Care Type  Not seen  Reason for referral  Clarify Goals of Care  Date of Admission  05/28/2016  Date first seen by Palliative Care  06/04/16  # of days Palliative referral response time  2 Day(s)  # of days IP prior to Palliative referral  4  Clinical Assessment  Palliative Performance Scale Score  10%  Psychosocial & Spiritual Assessment  Palliative Care Outcomes  Time In: 10:00 Time Out: 11:30 Time Total: 90 min. Greater than 50%  of this time was spent counseling and coordinating care related to the above assessment and plan.  Signed by: Imogene Burn, PA-C Palliative Medicine Pager: [phone]  Please contact Palliative Medicine Team phone at [phone] for questions and concerns.  For individual provider: See  Shea Evans

## 2016-06-04 NOTE — Progress Notes (Signed)
Called E-link once additional labs populated. Hemoglobin 6.6. Spoke with MD and explained that we could not call family and go over risks and benefits for a blood transfusion as that is out of our scope. He will review chart and call the on call MD. At this time no additional orders.

## 2016-06-04 NOTE — Progress Notes (Signed)
[facility] Critical Care Medicine Progess Note    SYNOPSIS   [age] F with multiple severe medical problems recently [facility] and readmitted via ED with altered mental status and respiratory arrest. Clinical data and presentation consistent with severe sepsis/septic shock, unclear source; ?SBO.   ASSESSMENT/PLAN    PULMONARY A: Acute hypoxic respiratory failure, vent dependent.  CXR personally reviewed; increased RML infiltrate, possibly atelectasis.  Continue vent settings as below.  P:   --Weaning sedation this AM,  however in current state doubt that patient would tolerate extubation.   VENTILATOR SETTINGS: Vent Mode: PCV FiO2 (%):  [28 %] 28 % Set Rate:  [12 bmp] 12 bmp PEEP:  [5 cmH20] 5 cmH20  CARDIOVASCULAR A: -- P:  Echo 05/14/16; EF=55%.   HEMODYNAMICS: CVP:  [0 mmHg] 0 mmHg  RENAL A:  Acute oliguric renal failure secondary to septic shock, currently on CRRT.  P:   Continue CRRT per nephro. Discussed with nephrology, palliative care. Agree with further palliative care discussions with family.   INTAKE / OUTPUT:  Intake/Output Summary (Last 24 hours) at 06/04/16 0851 Last data filed at 06/04/16 0800  Gross per 24 hour  Intake          3179.66 ml  Output             4438 ml  Net         -1258.34 ml    GASTROINTESTINAL A:  CT AP 06/02/16; c/w SBO.  P:   Continue decompression.   HEMATOLOGIC A: -- P:  --  INFECTIOUS A:  Septic shock.  P:   Continue broad spectrum abx.   Micro/culture results: BCx: NGTD TA: abundant yeast, rare mold 5/23 UCx: yeast 5/25 Ucx: yeast  MRSA PCR: negative C diff: sent GI PCR: sent  Antibiotics: levofloxacin 5/22 >> 5/23 Cefepime 5/22 >> Metronidazole 5/23 >> Doxycycline >> vanc 5/22 >>   ENDOCRINE A:  Hyperglycemia.    P:   Continue SSI.   NEUROLOGIC A:  Acute metabolic encephalopathy.  P:   RASS goal -1   MAJOR EVENTS/TEST RESULTS:   Best Practices  DVT Prophylaxis:  heparin SQ GI Prophylaxis: protonix   ---------------------------------------   ----------------------------------------   Name: Laura Kline MRN: [id number] DOB: 06/07/1955    ADMISSION DATE:  05/12/2016    CHIEF COMPLAINT:     STUDIES:     SUBJECTIVE:   Pt currently on the ventilator, can not provide history or review of systems.   Review of Systems:  Constitutional: Feels well. Cardiovascular: No chest pain.  Pulmonary: Denies dyspnea.   The remainder of systems were reviewed and were found to be negative other than what is documented in the HPI.    VITAL SIGNS: Temp:  [97.5 F (36.4 C)-97.9 F (36.6 C)] 97.5 F (36.4 C) (05/28 0400) Pulse Rate:  [93-117] 98 (05/28 0800) Resp:  [15-31] 28 (05/28 0800) BP: (83-161)/(23-140) 120/56 (05/28 0800) SpO2:  [94 %-100 %] 94 % (05/28 0800) FiO2 (%):  [28 %] 28 % (05/28 0742) Weight:  [361 lb (163.7 kg)] 361 lb (163.7 kg) (05/28 0500)     PHYSICAL EXAMINATION: Physical Examination:   VS: BP (!) 120/56   Pulse 98   Temp 97.5 F (36.4 C) (Oral)   Resp (!) 28   Ht 5' (1.524 m)   Wt (!) 361 lb (163.7 kg)   SpO2 94%   BMI 70.50 kg/m   General Appearance: No distress  Neuro:without focal findings, mental status  reduced, does not follow commands.  HEENT: PERRLA, EOM intact. Pulmonary: decreased breath sounds bilaterally.  CardiovascularNormal S1,S2.  No m/r/g.   Abdomen: Benign, Soft, non-tender. Renal:  No costovertebral tenderness  GU:  Not performed at this time. Endocrine: No evident thyromegaly. Skin:   warm, no rashes, no ecchymosis  Extremities: normal, no cyanosis, clubbing.    LABORATORY PANEL:   CBC  Recent Labs Lab 06/02/16 0816  WBC 4.5  HGB 10.5*  HCT 30.6*  PLT 68*    Chemistries   Recent Labs Lab 06/03/16 1502  06/04/16 0229 06/04/16 0817  NA  --   < > 137  --   K  --   < > 4.0  --   CL  --   < > 105  --   CO2  --   < > 23  --   GLUCOSE  --   < > 182*  --     BUN  --   < > 19  --   CREATININE  --   < > 0.88  --   CALCIUM  --   < > 8.5*  --   MG  --   < > 1.6* 2.1  PHOS  --   < > 1.7*  --   AST 31  --   --   --   ALT 20  --   --   --   ALKPHOS 78  --   --   --   BILITOT 4.5*  --   --   --   < > = values in this interval not displayed.   Recent Labs Lab 06/03/16 1131 06/03/16 1550 06/03/16 2004 06/04/16 0007 06/04/16 0407 06/04/16 0715  GLUCAP 172* 172* 202* 170* 183* 197*    Recent Labs Lab 05/30/16 0152 05/30/16 0431 06/02/16 0320  PHART 7.11* 7.21* 7.50*  PCO2ART 44 47 28*  PO2ART 80* 107 121*    Recent Labs Lab 05/31/16 0541  06/01/16 0256  06/03/16 1502 06/03/16 2020 06/04/16 0229  AST 39  --  47*  --  31  --   --   ALT 26  --  29  --  20  --   --   ALKPHOS 82  --  83  --  78  --   --   BILITOT 2.9*  --  2.9*  --  4.5*  --   --   ALBUMIN 1.9*  < > 1.9*  2.0*  < > 2.8* 2.8* 2.8*  < > = values in this interval not displayed.  Cardiac Enzymes  Recent Labs Lab 06/05/2016 1503  TROPONINI 0.04*    RADIOLOGY:  Ct Abdomen Pelvis Wo Contrast  Result Date: 06/02/2016 CLINICAL DATA:  Generalized abdominal pain. EXAM: CT ABDOMEN AND PELVIS WITHOUT CONTRAST TECHNIQUE: Multidetector CT imaging of the abdomen and pelvis was performed following the standard protocol without IV contrast. COMPARISON:  CT scan of May 29, 2016. FINDINGS: Lower chest: Minimal bilateral posterior basilar subsegmental atelectasis. Hepatobiliary: No focal abnormality is seen in the liver on these unenhanced images. Minimal cholelithiasis is noted. Pancreas: Unremarkable. No pancreatic ductal dilatation or surrounding inflammatory changes. Spleen: Normal in size without focal abnormality. Adrenals/Urinary Tract: Adrenal glands appear normal. No hydronephrosis or renal obstruction is noted. No renal or ureteral calculi are noted. Urinary bladder is decompressed secondary to Foley catheter. Stomach/Bowel: Distal tip of nasogastric tube is seen in distal  stomach. Mild gastric distention is noted. Small bowel dilatation is noted with  nondilated terminal ileum, but transition zone is not clearly identified. The appendix appears normal. Rectal tube is noted. Vascular/Lymphatic: Aortic atherosclerosis. No enlarged abdominal or pelvic lymph nodes. Reproductive: Uterus and bilateral adnexa are unremarkable. Other: No abdominal wall hernia or abnormality. No abdominopelvic ascites. Musculoskeletal: Multilevel degenerative disc disease is noted in the lower lumbar spine. IMPRESSION: Minimal cholelithiasis. Aortic atherosclerosis. Distal tip of nasogastric tube is seen in dilated distal stomach. Small bowel dilatation is noted with nondilated terminal ileum, but transition zone is not clearly identified. This is concerning for partial small bowel obstruction. Electronically Signed   By: Lupita Raider, M.D.   On: 06/02/2016 10:41   Dg Chest Port 1 View  Result Date: 06/03/2016 CLINICAL DATA:  Vent EXAM: PORTABLE CHEST 1 VIEW COMPARISON:  06/02/2016 FINDINGS: Endotracheal tube terminates 3 cm above the carina. Mild patchy left lower lobe opacity, likely atelectasis. Pulmonary vascular congestion. No frank interstitial edema. No pleural effusion or pneumothorax. The heart is top-normal in size for inspiration. IMPRESSION: Endotracheal tube terminates 3 cm above the carina. Mild patchy left lower lobe opacity, likely atelectasis. Electronically Signed   By: Charline Bills M.D.   On: 06/03/2016 09:26        --Wells Guiles, MD.  ICU Pager: [phone] [facility] Office Number: [phone]   06/04/2016   Critical Care Attestation.  I have personally obtained a history, examined the patient, evaluated laboratory and imaging results, formulated the assessment and plan and placed orders. The Patient requires high complexity decision making for assessment and support, frequent evaluation and titration of therapies, application of  advanced monitoring technologies and extensive interpretation of multiple databases. The patient has critical illness that could lead imminently to failure of 1 or more organ systems and requires the highest level of physician preparedness to intervene.  Critical Care Time devoted to patient care services described in this note is 45 minutes and is exclusive of time spent in procedures.

## 2016-06-04 NOTE — Progress Notes (Signed)
PHARMACY - CRITICAL CARE PROGRESS NOTE  Pharmacy Consult for Electrolytes  Indication: electrolyte management, pt on CRRT   Allergies  Allergen Reactions  . Rofecoxib Other (See Comments)    Reaction: unknown  . Penicillins Rash and Other (See Comments)    Has patient had a PCN reaction causing immediate rash, facial/tongue/throat swelling, SOB or lightheadedness with hypotension: Unknown Has patient had a PCN reaction causing severe rash involving mucus membranes or skin necrosis: Unknown Has patient had a PCN reaction that required hospitalization: Unknown Has patient had a PCN reaction occurring within the last 10 years: Unknown If all of the above answers are "NO", then may proceed with Cephalosporin use.     Patient Measurements: Height: 5' (152.4 cm) Weight: (!) 361 lb (163.7 kg) IBW/kg (Calculated) : 45.5 Adjusted Body Weight:   Vital Signs: Temp: 97.5 F (36.4 C) (05/28 0400) Temp Source: Oral (05/28 0400) BP: 117/47 (05/28 0700) Pulse Rate: 97 (05/28 0700) Intake/Output from previous day: 05/27 0701 - 05/28 0700 In: 3528.3 [I.V.:2285; NG/GT:90; IV Piggyback:1153.3] Out: 4342 [Urine:89; Emesis/NG output:1750; Stool:475] Intake/Output from this shift: No intake/output data recorded. Vent settings for last 24 hours: Vent Mode: PCV FiO2 (%):  [28 %] 28 % Set Rate:  [12 bmp] 12 bmp PEEP:  [5 cmH20] 5 cmH20  Labs:  Recent Labs  06/02/16 0455  06/02/16 0816  06/03/16 0518  06/03/16 1422 06/03/16 1502 06/03/16 2020 06/04/16 0229 06/04/16 0536  WBC  --   --  4.5  --   --   --   --   --   --   --   --   HGB  --   --  10.5*  --   --   --   --   --   --   --   --   HCT  --   --  30.6*  --   --   --   --   --   --   --   --   PLT  --   --  68*  --   --   --   --   --   --   --   --   APTT 80*  --   --   --  81*  --   --   --   --   --  130*  CREATININE  --   < > 1.26*  1.27*  < >  --   < > 1.01*  --  0.94 0.88  --   MG 1.9  --  1.9  < >  --   < > 1.8  --  1.7  1.6*  --   PHOS 2.5  < > 2.5  < >  --   < > 1.7*  --  1.9* 1.7*  --   ALBUMIN  --   < > 1.9*  < >  --   < > 2.8* 2.8* 2.8* 2.8*  --   PROT  --   --   --   --   --   --   --  4.6*  --   --   --   AST  --   --   --   --   --   --   --  31  --   --   --   ALT  --   --   --   --   --   --   --  20  --   --   --   ALKPHOS  --   --   --   --   --   --   --  78  --   --   --   BILITOT  --   --   --   --   --   --   --  4.5*  --   --   --   BILIDIR  --   --   --   --   --   --   --  2.5*  --   --   --   IBILI  --   --   --   --   --   --   --  2.0*  --   --   --   < > = values in this interval not displayed. Estimated Creatinine Clearance: 97.1 mL/min (by C-G formula based on SCr of 0.88 mg/dL).   Recent Labs  06/04/16 0007 06/04/16 0407 06/04/16 0715  GLUCAP 170* 183* 197*    Medications:  Prescriptions Prior to Admission  Medication Sig Dispense Refill Last Dose  . apixaban (ELIQUIS) 5 MG TABS tablet Take 1 tablet (5 mg total) by mouth 2 (two) times daily. 60 tablet 0 05/25/2016 at am  . [EXPIRED] clindamycin (CLEOCIN) 300 MG capsule Take 1 capsule (300 mg total) by mouth 4 (four) times daily. 24 capsule 0 05/10/2016 at am  . digoxin (LANOXIN) 0.25 MG tablet Take 1 tablet (0.25 mg total) by mouth daily.   05/25/2016 at am  . diltiazem (CARDIZEM CD) 180 MG 24 hr capsule Take 1 capsule (180 mg total) by mouth daily.   05/18/2016 at am  . furosemide (LASIX) 40 MG tablet Take 1 tablet (40 mg total) by mouth daily. 30 tablet  05/27/2016 at am  . HYDROcodone-acetaminophen (NORCO) 7.5-325 MG tablet Take 1 tablet by mouth every 6 (six) hours as needed for moderate pain. 30 tablet 0 prn at prn  . Insulin Glargine (LANTUS SOLOSTAR) 100 UNIT/ML Solostar Pen Inject 50 Units into the skin at bedtime. 15 mL 11 05/28/2016 at qhs  . [EXPIRED] levofloxacin (LEVAQUIN) 500 MG tablet Take 1 tablet (500 mg total) by mouth daily. 6 tablet 0 05/23/2016 at am  . loperamide (IMODIUM A-D) 2 MG tablet Take 2 mg by mouth  every 8 (eight) hours as needed for diarrhea or loose stools.   prn at prn  . metoprolol (LOPRESSOR) 50 MG tablet Take 1 tablet (50 mg total) by mouth 2 (two) times daily. 180 tablet 3 05/09/2016 at am  . norethindrone (AYGESTIN) 5 MG tablet Take 1 tablet (5 mg total) by mouth 3 (three) times daily. 30 tablet 0 05/20/2016 at am  . nystatin (MYCOSTATIN/NYSTOP) powder Apply 1 g topically as needed (for rash and redness).   prn at prn  . ondansetron (ZOFRAN) 4 MG tablet Take 1 tablet (4 mg total) by mouth every 6 (six) hours as needed for nausea. 20 tablet 0 prn at prn  . rosuvastatin (CRESTOR) 10 MG tablet Take 1 tablet (10 mg total) by mouth daily after supper. 90 tablet 3 05/28/2016 at pm  . senna-docusate (SENOKOT-S) 8.6-50 MG tablet Take 1 tablet by mouth 2 (two) times daily.   05/08/2016 at am    Assessment: 5/28:  All within normal limits except Magnesium 1.6.  Goal of Therapy:  Normalization of electrolytes   Plan:  Patient received Magnesium 2 g IV x 1. Will recheck  electrolytes in am.    Demetrius Charityeldrin D. Solace Manwarren, PharmD  06/04/2016 7:56 AM

## 2016-06-04 NOTE — Plan of Care (Signed)
Problem: Pain Managment: Goal: General experience of comfort will improve Outcome: Progressing Pt. Remains on 100 mcg of fentanyl, resting comfortably, RASS: -1 able to follow commands  Problem: Physical Regulation: Goal: Ability to maintain clinical measurements within normal limits will improve Outcome: Progressing Titrated levo down from 15 mcg to 7 mcg O/N  Problem: Activity: Goal: Risk for activity intolerance will decrease Outcome: Progressing Pt. Able to follow simple commands.  Problem: Fluid Volume: Goal: Ability to maintain a balanced intake and output will improve Outcome: Progressing Pt. UOP remains low on average- 75 mL O/N  Problem: Nutrition: Goal: Adequate nutrition will be maintained Outcome: Not Progressing Pt. remains NPO

## 2016-06-05 ENCOUNTER — Inpatient Hospital Stay: Payer: BLUE CROSS/BLUE SHIELD

## 2016-06-05 LAB — BLOOD GAS, ARTERIAL
Acid-base deficit: 2.8 mmol/L — ABNORMAL HIGH (ref 0.0–2.0)
Bicarbonate: 20.6 mmol/L (ref 20.0–28.0)
FIO2: 0.28
LHR: 12 {breaths}/min
Mechanical Rate: 12
O2 SAT: 99.2 %
PATIENT TEMPERATURE: 37
PCO2 ART: 29 mmHg — AB (ref 32.0–48.0)
PEEP/CPAP: 5 cmH2O
PH ART: 7.46 — AB (ref 7.350–7.450)
PO2 ART: 133 mmHg — AB (ref 83.0–108.0)
Pressure control: 10 cmH2O

## 2016-06-05 LAB — GASTROINTESTINAL PANEL BY PCR, STOOL (REPLACES STOOL CULTURE)
Adenovirus F40/41: NOT DETECTED
Astrovirus: NOT DETECTED
CAMPYLOBACTER SPECIES: NOT DETECTED
CYCLOSPORA CAYETANENSIS: NOT DETECTED
Cryptosporidium: NOT DETECTED
ENTAMOEBA HISTOLYTICA: NOT DETECTED
ENTEROTOXIGENIC E COLI (ETEC): NOT DETECTED
Enteroaggregative E coli (EAEC): NOT DETECTED
Enteropathogenic E coli (EPEC): NOT DETECTED
Giardia lamblia: NOT DETECTED
Norovirus GI/GII: NOT DETECTED
PLESIMONAS SHIGELLOIDES: NOT DETECTED
Rotavirus A: NOT DETECTED
SALMONELLA SPECIES: NOT DETECTED
SAPOVIRUS (I, II, IV, AND V): NOT DETECTED
SHIGA LIKE TOXIN PRODUCING E COLI (STEC): NOT DETECTED
SHIGELLA/ENTEROINVASIVE E COLI (EIEC): NOT DETECTED
VIBRIO CHOLERAE: NOT DETECTED
VIBRIO SPECIES: NOT DETECTED
Yersinia enterocolitica: NOT DETECTED

## 2016-06-05 LAB — CBC
HEMATOCRIT: 19.4 % — AB (ref 35.0–47.0)
HEMOGLOBIN: 6.5 g/dL — AB (ref 12.0–16.0)
MCH: 31.3 pg (ref 26.0–34.0)
MCHC: 33.5 g/dL (ref 32.0–36.0)
MCV: 93.5 fL (ref 80.0–100.0)
Platelets: 28 10*3/uL — CL (ref 150–440)
RBC: 2.07 MIL/uL — AB (ref 3.80–5.20)
RDW: 14.5 % (ref 11.5–14.5)
WBC: 2.8 10*3/uL — ABNORMAL LOW (ref 3.6–11.0)

## 2016-06-05 LAB — RENAL FUNCTION PANEL
Albumin: 3.4 g/dL — ABNORMAL LOW (ref 3.5–5.0)
Anion gap: 9 (ref 5–15)
BUN: 16 mg/dL (ref 6–20)
CHLORIDE: 106 mmol/L (ref 101–111)
CO2: 24 mmol/L (ref 22–32)
Calcium: 8.9 mg/dL (ref 8.9–10.3)
Creatinine, Ser: 0.86 mg/dL (ref 0.44–1.00)
GFR calc Af Amer: >60 mL/min (ref >60)
GLUCOSE: 189 mg/dL — AB (ref 65–99)
POTASSIUM: 4.3 mmol/L (ref 3.5–5.1)
Phosphorus: 1.4 mg/dL — ABNORMAL LOW (ref 2.5–4.6)
Sodium: 139 mmol/L (ref 135–145)

## 2016-06-05 LAB — GLUCOSE, CAPILLARY
GLUCOSE-CAPILLARY: 158 mg/dL — AB (ref 65–99)
GLUCOSE-CAPILLARY: 168 mg/dL — AB (ref 65–99)
GLUCOSE-CAPILLARY: 183 mg/dL — AB (ref 65–99)
GLUCOSE-CAPILLARY: 183 mg/dL — AB (ref 65–99)
Glucose-Capillary: 175 mg/dL — ABNORMAL HIGH (ref 65–99)
Glucose-Capillary: 216 mg/dL — ABNORMAL HIGH (ref 65–99)

## 2016-06-05 LAB — APTT: aPTT: 113 seconds — ABNORMAL HIGH (ref 24–36)

## 2016-06-05 LAB — MAGNESIUM: MAGNESIUM: 1.9 mg/dL (ref 1.7–2.4)

## 2016-06-05 MED ORDER — FLUCONAZOLE IN SODIUM CHLORIDE 400-0.9 MG/200ML-% IV SOLN
400.0000 mg | INTRAVENOUS | Status: DC
  Filled 2016-06-05: qty 200

## 2016-06-05 MED ORDER — METOCLOPRAMIDE HCL 5 MG/ML IJ SOLN
10.0000 mg | Freq: Two times a day (BID) | INTRAMUSCULAR | Status: DC
  Administered 2016-06-05: 10 mg via INTRAVENOUS
  Filled 2016-06-05: qty 2

## 2016-06-05 MED ORDER — DEXTROSE 5 % IV SOLN
1.0000 g | INTRAVENOUS | Status: DC
  Filled 2016-06-05: qty 1

## 2016-06-05 MED ORDER — FENTANYL BOLUS VIA INFUSION
50.0000 ug | INTRAVENOUS | Status: DC | PRN
  Filled 2016-06-05: qty 100

## 2016-06-05 MED ORDER — SODIUM PHOSPHATES 45 MMOLE/15ML IV SOLN
20.0000 mmol | Freq: Once | INTRAVENOUS | Status: AC
  Administered 2016-06-05: 20 mmol via INTRAVENOUS
  Filled 2016-06-05: qty 6.67

## 2016-06-05 MED ORDER — FENTANYL BOLUS VIA INFUSION
50.0000 ug | INTRAVENOUS | Status: DC | PRN
  Administered 2016-06-06: 100 ug via INTRAVENOUS
  Filled 2016-06-05: qty 100

## 2016-06-05 NOTE — Progress Notes (Signed)
PHARMACY - CRITICAL CARE PROGRESS NOTE  Pharmacy Consult for Electrolytes  Indication: electrolyte management, pt on CRRT   Allergies  Allergen Reactions  . Rofecoxib Other (See Comments)    Reaction: unknown  . Penicillins Rash and Other (See Comments)    Has patient had a PCN reaction causing immediate rash, facial/tongue/throat swelling, SOB or lightheadedness with hypotension: Unknown Has patient had a PCN reaction causing severe rash involving mucus membranes or skin necrosis: Unknown Has patient had a PCN reaction that required hospitalization: Unknown Has patient had a PCN reaction occurring within the last 10 years: Unknown If all of the above answers are "NO", then may proceed with Cephalosporin use.     Patient Measurements: Height: 5' (152.4 cm) Weight: (!) 371 lb (168.3 kg) IBW/kg (Calculated) : 45.5 Adjusted Body Weight:   Vital Signs: Temp: 98.6 F (37 C) (05/29 0400) Temp Source: Oral (05/29 0400) BP: 98/54 (05/29 1200) Pulse Rate: 103 (05/29 1200) Intake/Output from previous day: 05/28 0701 - 05/29 0700 In: 3081.9 [I.V.:1625.2; IV Piggyback:1456.7] Out: 3492 [Urine:10; Emesis/NG output:1100; Stool:350] Intake/Output from this shift: No intake/output data recorded. Vent settings for last 24 hours: Vent Mode: PCV FiO2 (%):  [28 %] 28 % Set Rate:  [12 bmp] 12 bmp PEEP:  [5 cmH20] 5 cmH20  Labs:  Recent Labs  06/03/16 0518  06/03/16 1502  06/04/16 0536  06/04/16 1428 06/04/16 1430 06/04/16 2011 06/05/16 0156 06/05/16 0453  WBC  --   --   --   --   --   --   --  2.0*  --   --  2.8*  HGB  --   --   --   --   --   --   --  6.6*  --   --  6.5*  HCT  --   --   --   --   --   --   --  19.3*  --   --  19.4*  PLT  --   --   --   --   --   --   --  27*  --   --  28*  APTT 81*  --   --   --  130*  --   --   --   --   --  113*  CREATININE  --   < >  --   < >  --   < > 0.91  --  0.86 0.86  --   MG  --   < >  --   < >  --   < >  --  1.9 1.8 1.9  --   PHOS   --   < >  --   < >  --   < > 1.4*  --  1.4* 1.4*  --   ALBUMIN  --   < > 2.8*  < >  --   < > 2.9*  --  3.1* 3.4*  --   PROT  --   --  4.6*  --   --   --   --   --   --   --   --   AST  --   --  31  --   --   --   --   --   --   --   --   ALT  --   --  20  --   --   --   --   --   --   --   --  ALKPHOS  --   --  36  --   --   --   --   --   --   --   --   BILITOT  --   --  4.5*  --   --   --   --   --   --   --   --   BILIDIR  --   --  2.5*  --   --   --   --   --   --   --   --   IBILI  --   --  2.0*  --   --   --   --   --   --   --   --   < > = values in this interval not displayed. Estimated Creatinine Clearance: 101.3 mL/min (by C-G formula based on SCr of 0.86 mg/dL).  Potassium (mmol/L)  Date Value  06/05/2016 4.3   Calcium (mg/dL)  Date Value  16/10/9602 8.9     Recent Labs  06/05/16 0341 06/05/16 0740 06/05/16 1227  GLUCAP 175* 183* 216*    Medications:  Prescriptions Prior to Admission  Medication Sig Dispense Refill Last Dose  . apixaban (ELIQUIS) 5 MG TABS tablet Take 1 tablet (5 mg total) by mouth 2 (two) times daily. 60 tablet 0 2016-06-18 at am  . [EXPIRED] clindamycin (CLEOCIN) 300 MG capsule Take 1 capsule (300 mg total) by mouth 4 (four) times daily. 24 capsule 0 06-18-2016 at am  . digoxin (LANOXIN) 0.25 MG tablet Take 1 tablet (0.25 mg total) by mouth daily.   June 18, 2016 at am  . diltiazem (CARDIZEM CD) 180 MG 24 hr capsule Take 1 capsule (180 mg total) by mouth daily.   06-18-16 at am  . furosemide (LASIX) 40 MG tablet Take 1 tablet (40 mg total) by mouth daily. 30 tablet  2016/06/18 at am  . HYDROcodone-acetaminophen (NORCO) 7.5-325 MG tablet Take 1 tablet by mouth every 6 (six) hours as needed for moderate pain. 30 tablet 0 prn at prn  . Insulin Glargine (LANTUS SOLOSTAR) 100 UNIT/ML Solostar Pen Inject 50 Units into the skin at bedtime. 15 mL 11 05/28/2016 at qhs  . [EXPIRED] levofloxacin (LEVAQUIN) 500 MG tablet Take 1 tablet (500 mg total) by mouth daily.  6 tablet 0 2016-06-18 at am  . loperamide (IMODIUM A-D) 2 MG tablet Take 2 mg by mouth every 8 (eight) hours as needed for diarrhea or loose stools.   prn at prn  . metoprolol (LOPRESSOR) 50 MG tablet Take 1 tablet (50 mg total) by mouth 2 (two) times daily. 180 tablet 3 06-18-2016 at am  . norethindrone (AYGESTIN) 5 MG tablet Take 1 tablet (5 mg total) by mouth 3 (three) times daily. 30 tablet 0 06/18/2016 at am  . nystatin (MYCOSTATIN/NYSTOP) powder Apply 1 g topically as needed (for rash and redness).   prn at prn  . ondansetron (ZOFRAN) 4 MG tablet Take 1 tablet (4 mg total) by mouth every 6 (six) hours as needed for nausea. 20 tablet 0 prn at prn  . rosuvastatin (CRESTOR) 10 MG tablet Take 1 tablet (10 mg total) by mouth daily after supper. 90 tablet 3 05/28/2016 at pm  . senna-docusate (SENOKOT-S) 8.6-50 MG tablet Take 1 tablet by mouth 2 (two) times daily.   06-18-16 at am    Assessment: 61 y/o F with a h/o multiple medical problems readmitted to [facility] from ED with AMS and respiratory arrest. Patient remains  ventilated, sedated, and requiring pressors.   Goal of Therapy:  Normalization of electrolytes   Plan:  Will not order further electrolytes at this point with plans to withdraw care tomorrow.   Luisa HartScott Adeliz Tonkinson, PharmD Clinical Pharmacist  06/05/16 1:32 PM

## 2016-06-05 NOTE — Progress Notes (Signed)
PHARMACY - CRITICAL CARE PROGRESS NOTE  Pharmacy Consult for Electrolytes  Indication: electrolyte management, pt on CRRT   Allergies  Allergen Reactions  . Rofecoxib Other (See Comments)    Reaction: unknown  . Penicillins Rash and Other (See Comments)    Has patient had a PCN reaction causing immediate rash, facial/tongue/throat swelling, SOB or lightheadedness with hypotension: Unknown Has patient had a PCN reaction causing severe rash involving mucus membranes or skin necrosis: Unknown Has patient had a PCN reaction that required hospitalization: Unknown Has patient had a PCN reaction occurring within the last 10 years: Unknown If all of the above answers are "NO", then may proceed with Cephalosporin use.     Patient Measurements: Height: 5' (152.4 cm) Weight: (!) 371 lb (168.3 kg) IBW/kg (Calculated) : 45.5 Adjusted Body Weight:   Vital Signs: Temp: 98.6 F (37 C) (05/29 0400) Temp Source: Oral (05/29 0400) BP: 130/56 (05/29 0400) Pulse Rate: 113 (05/29 0400) Intake/Output from previous day: 05/28 0701 - 05/29 0700 In: 2734.9 [I.V.:1534.9; IV Piggyback:1200] Out: 3395 [Urine:10; Emesis/NG output:1100; Stool:350] Intake/Output from this shift: Total I/O In: 2734.9 [I.V.:1534.9; IV Piggyback:1200] Out: 1375 [Emesis/NG output:500; Other:875] Vent settings for last 24 hours: Vent Mode: PCV FiO2 (%):  [28 %] 28 % Set Rate:  [12 bmp] 12 bmp PEEP:  [5 cmH20] 5 cmH20  Labs:  Recent Labs  06/02/16 0455  06/02/16 0816  06/03/16 0518  06/03/16 1502  06/04/16 0536  06/04/16 1428 06/04/16 1430 06/04/16 2011 06/05/16 0156  WBC  --   --  4.5  --   --   --   --   --   --   --   --  2.0*  --   --   HGB  --   --  10.5*  --   --   --   --   --   --   --   --  6.6*  --   --   HCT  --   --  30.6*  --   --   --   --   --   --   --   --  19.3*  --   --   PLT  --   --  68*  --   --   --   --   --   --   --   --  27*  --   --   APTT 80*  --   --   --  81*  --   --   --  130*   --   --   --   --   --   CREATININE  --   < > 1.26*  1.27*  < >  --   < >  --   < >  --   < > 0.91  --  0.86 0.86  MG 1.9  --  1.9  < >  --   < >  --   < >  --   < >  --  1.9 1.8 1.9  PHOS 2.5  < > 2.5  < >  --   < >  --   < >  --   < > 1.4*  --  1.4* 1.4*  ALBUMIN  --   < > 1.9*  < >  --   < > 2.8*  < >  --   < > 2.9*  --  3.1* 3.4*  PROT  --   --   --   --   --   --  4.6*  --   --   --   --   --   --   --   AST  --   --   --   --   --   --  31  --   --   --   --   --   --   --   ALT  --   --   --   --   --   --  20  --   --   --   --   --   --   --   ALKPHOS  --   --   --   --   --   --  78  --   --   --   --   --   --   --   BILITOT  --   --   --   --   --   --  4.5*  --   --   --   --   --   --   --   BILIDIR  --   --   --   --   --   --  2.5*  --   --   --   --   --   --   --   IBILI  --   --   --   --   --   --  2.0*  --   --   --   --   --   --   --   < > = values in this interval not displayed. Estimated Creatinine Clearance: 101.3 mL/min (by C-G formula based on SCr of 0.86 mg/dL).   Recent Labs  06/04/16 2004 06/04/16 2349 06/05/16 0341  GLUCAP 168* 186* 175*    Medications:  Prescriptions Prior to Admission  Medication Sig Dispense Refill Last Dose  . apixaban (ELIQUIS) 5 MG TABS tablet Take 1 tablet (5 mg total) by mouth 2 (two) times daily. 60 tablet 0 01/21/16 at am  . [EXPIRED] clindamycin (CLEOCIN) 300 MG capsule Take 1 capsule (300 mg total) by mouth 4 (four) times daily. 24 capsule 0 01/21/16 at am  . digoxin (LANOXIN) 0.25 MG tablet Take 1 tablet (0.25 mg total) by mouth daily.   01/21/16 at am  . diltiazem (CARDIZEM CD) 180 MG 24 hr capsule Take 1 capsule (180 mg total) by mouth daily.   01/21/16 at am  . furosemide (LASIX) 40 MG tablet Take 1 tablet (40 mg total) by mouth daily. 30 tablet  01/21/16 at am  . HYDROcodone-acetaminophen (NORCO) 7.5-325 MG tablet Take 1 tablet by mouth every 6 (six) hours as needed for moderate pain. 30 tablet 0 prn at prn  .  Insulin Glargine (LANTUS SOLOSTAR) 100 UNIT/ML Solostar Pen Inject 50 Units into the skin at bedtime. 15 mL 11 05/28/2016 at qhs  . [EXPIRED] levofloxacin (LEVAQUIN) 500 MG tablet Take 1 tablet (500 mg total) by mouth daily. 6 tablet 0 01/21/16 at am  . loperamide (IMODIUM A-D) 2 MG tablet Take 2 mg by mouth every 8 (eight) hours as needed for diarrhea or loose stools.   prn at prn  . metoprolol (LOPRESSOR) 50 MG tablet Take 1 tablet (50 mg total) by mouth 2 (two) times daily. 180 tablet 3 01/21/16 at am  . norethindrone (AYGESTIN) 5 MG tablet Take 1 tablet (5 mg total) by mouth 3 (three) times daily. 30 tablet 0  Jun 03, 2016 at am  . nystatin (MYCOSTATIN/NYSTOP) powder Apply 1 g topically as needed (for rash and redness).   prn at prn  . ondansetron (ZOFRAN) 4 MG tablet Take 1 tablet (4 mg total) by mouth every 6 (six) hours as needed for nausea. 20 tablet 0 prn at prn  . rosuvastatin (CRESTOR) 10 MG tablet Take 1 tablet (10 mg total) by mouth daily after supper. 90 tablet 3 05/28/2016 at pm  . senna-docusate (SENOKOT-S) 8.6-50 MG tablet Take 1 tablet by mouth 2 (two) times daily.   06-03-16 at am    Assessment: 05/29 0200 PO4 1.4  Goal of Therapy:  Normalization of electrolytes   Plan:  20 mmol sodium phosphate ordered. Recheck with next interval labs.    Fulton Reek, PharmD, BCPS  06/05/16 4:53 AM

## 2016-06-05 NOTE — Care Management (Signed)
CRRT has stopped. Family meeting to determine outcome goals. Both [facility] and [facility] updated.

## 2016-06-05 NOTE — Progress Notes (Signed)
[facility]  ROUNDING NOTE   Subjective:   Patient remains critically ill Off vasopressors CRRT stopped by ICU team UOP remains low Patient is ventilator dependent; tachypneic  Objective:  Vital signs in last 24 hours:  Temp:  [97.9 F (36.6 C)-98.7 F (37.1 C)] 98.6 F (37 C) (05/29 0400) Pulse Rate:  [96-120] 96 (05/29 0700) Resp:  [19-31] 21 (05/29 0700) BP: (89-141)/(43-109) 89/49 (05/29 0700) SpO2:  [90 %-100 %] 100 % (05/29 0718) FiO2 (%):  [28 %] 28 % (05/29 0718) Weight:  [168.3 kg (371 lb)] 168.3 kg (371 lb) (05/29 0218)  Weight change: 4.536 kg (10 lb) Filed Weights   06/03/16 0300 06/04/16 0500 06/05/16 0218  Weight: (!) 166.5 kg (367 lb) (!) 163.7 kg (361 lb) (!) 168.3 kg (371 lb)    Intake/Output: I/O last 3 completed shifts: In: 4295.5 [I.V.:2598.8; NG/GT:90; IV Piggyback:1606.7] Out: 8502 [Urine:85; Emesis/NG output:2000; DXAJO:8786; Stool:575]   Intake/Output this shift:  No intake/output data recorded.  Physical Exam: General: Critically ill  Head: ETT NGT  Eyes: Anicteric,    Neck: RIJ temp HD catheter,    Lungs:  crackles bilaterally,   FiO2 28%, RR high  Heart: Irregular, tachycardic  Abdomen:  Soft, nontender, obese  Extremities: + peripheral edema.  Neurologic: Intubated, sedated      Access: RIJ temp HD catheter Dr. Alva Garnet 7/67    Basic Metabolic Panel:  Recent Labs Lab 06/04/16 0229 06/04/16 0817 06/04/16 1428 06/04/16 1430 06/04/16 2011 06/05/16 0156  NA 137 135 137  --  138 139  K 4.0 4.3 4.1  --  4.2 4.3  CL 105 104 104  --  107 106  CO2 23 21* 25  --  23 24  GLUCOSE 182* 216* 182*  --  184* 189*  BUN 19 18 16   --  16 16  CREATININE 0.88 1.01* 0.91  --  0.86 0.86  CALCIUM 8.5* 8.8* 8.9  --  8.7* 8.9  MG 1.6* 2.1  --  1.9 1.8 1.9  PHOS 1.7* 1.8* 1.4*  --  1.4* 1.4*    Liver Function Tests:  Recent Labs Lab 06/03/2016 1503 05/30/16 0529  05/31/16 0541  06/01/16 0256  06/03/16 1502  06/04/16 0229  06/04/16 0817 06/04/16 1428 06/04/16 2011 06/05/16 0156  AST 34 44*  --  39  --  47*  --  31  --   --   --   --   --   --   ALT 30 29  --  26  --  29  --  20  --   --   --   --   --   --   ALKPHOS 89 81  --  82  --  83  --  78  --   --   --   --   --   --   BILITOT 3.0* 4.0*  --  2.9*  --  2.9*  --  4.5*  --   --   --   --   --   --   PROT 5.3* 4.5*  --  4.5*  --  4.4*  --  4.6*  --   --   --   --   --   --   ALBUMIN 2.1* 1.8*  < > 1.9*  < > 1.9*  2.0*  < > 2.8*  < > 2.8* 2.8* 2.9* 3.1* 3.4*  < > = values in this interval not displayed.  Recent  Labs Lab 05/18/2016 1503 06/05/2016 2050  LIPASE 16  --   AMYLASE  --  71    Recent Labs Lab 06/03/16 1502  AMMONIA 13    CBC:  Recent Labs Lab 06/01/2016 1503  05/31/16 0541 06/01/16 0256 06/02/16 0816 06/04/16 1430 06/05/16 0453  WBC 19.8*  < > 9.3 4.8 4.5 2.0* 2.8*  NEUTROABS 14.0*  --   --   --   --   --   --   HGB 14.4  < > 11.9* 11.3* 10.5* 6.6* 6.5*  HCT 44.6  < > 34.6* 32.4* 30.6* 19.3* 19.4*  MCV 95.5  < > 92.5 92.3 90.3 93.9 93.5  PLT 438  < > 151 110* 68* 27* 28*  < > = values in this interval not displayed.  Cardiac Enzymes:  Recent Labs Lab 05/10/2016 1503  TROPONINI 0.04*    BNP: Invalid input(s): POCBNP  CBG:  Recent Labs Lab 06/04/16 1618 06/04/16 2004 06/04/16 2349 06/05/16 0341 06/05/16 0740  GLUCAP 179* 168* 186* 175* 183*    Microbiology: Results for orders placed or performed during the hospital encounter of 06/04/2016  Blood Culture (routine x 2)     Status: None   Collection Time: 05/20/2016  3:03 PM  Result Value Ref Range Status   Specimen Description BLOOD L AC  Final   Special Requests BOTTLES DRAWN AEROBIC AND ANAEROBIC BCAV  Final   Culture NO GROWTH 5 DAYS  Final   Report Status 06/03/2016 FINAL  Final  Blood Culture (routine x 2)     Status: None   Collection Time: 05/23/2016  4:42 PM  Result Value Ref Range Status   Specimen Description BLOOD RIGHT NECK  Final   Special  Requests   Final    BOTTLES DRAWN AEROBIC AND ANAEROBIC Blood Culture results may not be optimal due to an excessive volume of blood received in culture bottles   Culture NO GROWTH 5 DAYS  Final   Report Status 06/03/2016 FINAL  Final  MRSA PCR Screening     Status: None   Collection Time: 06/02/2016  8:35 PM  Result Value Ref Range Status   MRSA by PCR NEGATIVE NEGATIVE Final    Comment:        The GeneXpert MRSA Assay (FDA approved for NASAL specimens only), is one component of a comprehensive MRSA colonization surveillance program. It is not intended to diagnose MRSA infection nor to guide or monitor treatment for MRSA infections.   Urine culture     Status: Abnormal   Collection Time: 05/30/16  1:51 AM  Result Value Ref Range Status   Specimen Description URINE, RANDOM  Final   Special Requests NONE  Final   Culture >=100,000 COLONIES/mL YEAST (A)  Final   Report Status 05/31/2016 FINAL  Final  Culture, respiratory (NON-Expectorated)     Status: None   Collection Time: 05/30/16  2:45 AM  Result Value Ref Range Status   Specimen Description TRACHEAL ASPIRATE  Final   Special Requests NONE  Final   Gram Stain   Final    FEW WBC PRESENT, PREDOMINANTLY MONONUCLEAR MODERATE YEAST FEW GRAM POSITIVE COCCI RARE GRAM VARIABLE ROD Performed at [facility], [address], [place], [place] [address]    Culture   Final    ABUNDANT CANDIDA ALBICANS RARE FUNGUS (MOLD) ISOLATED, PROBABLE CONTAMINANT/COLONIZER (SAPROPHYTE). CONTACT MICROBIOLOGY IF FURTHER IDENTIFICATION REQUIRED [phone].    Report Status 06/02/2016 FINAL  Final  Urine culture  Status: Abnormal   Collection Time: 06/01/16 12:18 PM  Result Value Ref Range Status   Specimen Description URINE, RANDOM  Final   Special Requests NONE  Final   Culture >=100,000 COLONIES/mL YEAST (A)  Final   Report Status 06/02/2016 FINAL  Final  C difficile quick scan w PCR reflex     Status: None   Collection Time:  06/01/16 12:18 PM  Result Value Ref Range Status   C Diff antigen NEGATIVE NEGATIVE Final   C Diff toxin NEGATIVE NEGATIVE Final   C Diff interpretation No C. difficile detected.  Final  Culture, respiratory (NON-Expectorated)     Status: None   Collection Time: 06/02/16  8:35 AM  Result Value Ref Range Status   Specimen Description TRACHEAL ASPIRATE  Final   Special Requests NONE  Final   Gram Stain   Final    MODERATE WBC PRESENT,BOTH PMN AND MONONUCLEAR MODERATE YEAST Performed at [facility], [place] [address], [place],  [address]    Culture FEW CANDIDA ALBICANS  Final   Report Status 06/04/2016 FINAL  Final    Coagulation Studies: No results for input(s): LABPROT, INR in the last 72 hours.  Urinalysis: No results for input(s): COLORURINE, LABSPEC, PHURINE, GLUCOSEU, HGBUR, BILIRUBINUR, KETONESUR, PROTEINUR, UROBILINOGEN, NITRITE, LEUKOCYTESUR in the last 72 hours.  Invalid input(s): APPERANCEUR    Imaging: Dg Chest 1 View  Result Date: 06/05/2016 CLINICAL DATA:  Shortness of breath. EXAM: CHEST 1 VIEW COMPARISON:  06/03/2016. FINDINGS: Endotracheal tube, NG tube, right subclavian line in stable position. Mild bibasilar subsegmental atelectasis/infiltrates noted. Mild left base atelectasis has slightly improved. No pleural effusion or pneumothorax . IMPRESSION: 1. Lines and tubes in stable position. 2. Bibasilar subsegmental atelectasis/infiltrate. Mild left base atelectasis has improved slightly from prior exam . Electronically Signed   By: [place]   On: 06/05/2016 06:55     Medications:   . anticoagulant sodium citrate    . ceFEPime (MAXIPIME) IV Stopped (06/05/16 0309)  . fentaNYL 200 mcg/hr (06/05/16 0920)  . fluconazole (DIFLUCAN) IV Stopped (06/04/16 2055)  . metronidazole 500 mg (06/05/16 0753)  . norepinephrine (LEVOPHED) Adult infusion Stopped (06/04/16 0830)  . sodium chloride Stopped (05/30/16 1212)  . sodium phosphate  Dextrose 5% IVPB  20 mmol (06/05/16 0518)  . vasopressin (PITRESSIN) infusion - *FOR SHOCK* 0.03 Units/min (06/04/16 1926)   . budesonide (PULMICORT) nebulizer solution  0.25 mg Nebulization Q6H  . chlorhexidine gluconate (MEDLINE KIT)  15 mL Mouth Rinse BID  . doxycycline  100 mg Oral Q12H  . feeding supplement (PRO-STAT SUGAR FREE 64)  30 mL Oral BID  . insulin aspart  0-20 Units Subcutaneous Q4H  . ipratropium-albuterol  3 mL Nebulization Q6H  . mouth rinse  15 mL Mouth Rinse 10 times per day  . metoCLOPramide (REGLAN) injection  10 mg Intravenous Q8H  . pantoprazole sodium  40 mg Per Tube Q1200  . sodium chloride flush  10-40 mL Intracatheter Q12H   anticoagulant sodium citrate, bisacodyl, fentaNYL, metoprolol tartrate, midazolam, [DISCONTINUED] ondansetron **OR** ondansetron (ZOFRAN) IV, sennosides, sodium chloride flush  Assessment/ Plan:  Ms. Laura Kline is a 61 y.o. white female with diabetes mellitus type II insulin dependent, hypertension, coronary artery disease, hyperlipidemia, peripheral vascular disease, atrial fibrillation who was admitted to [facility] on 06/03/2016  1. Acute renal failure with metabolic acidosis: anuric. Requiring vasopressors (norepinephrine and vasopressin). Baseline 1.1 on 05/18/16 Secondary to ATN from sepsis, hypotension, shock.  - CRRT stopped by ICU team -  Patient is oligo-anuric - Palliative care team is following.  - if family wants to pursue aggressive care, iHD can be considered but may be difficult with low BP - replace electrolytes as necessary - Dose meds for CrCl <10  2. Sepsis/shock: requiring vasopressors.   - fluconazole, doxycycline, metronidazole and cefepime.  - Abx as per ICU/ID team  3. Acute Respiratory failure requiring mechanical ventilation -   FiO2 28%       LOS: 7 Laura Kline 5/29/20189:14 AM

## 2016-06-05 NOTE — Plan of Care (Signed)
Problem: Safety: Goal: Ability to remain free from injury will improve Outcome: Progressing Pt will remain injury free. Safe environment provided for pt

## 2016-06-05 NOTE — Progress Notes (Signed)
[facility] Critical Care Medicine Progess Note    SYNOPSIS   [age] F with multiple severe medical problems [facility] and readmitted via ED with altered mental status and respiratory arrest. Clinical data and presentation consistent with severe sepsis/septic shock, unclear source; ?SBO.   ASSESSMENT/PLAN    PULMONARY A: Acute hypoxic respiratory failure, vent dependent.  CXR personally reviewed; increased RML infiltrate, possibly atelectasis.  ABG reviewed shows compensated respiratory failure.  P:   --Weaning sedation this AM,  however in current state doubt that patient would tolerate extubation.   VENTILATOR SETTINGS: Vent Mode: PCV FiO2 (%):  [28 %] 28 % Set Rate:  [12 bmp] 12 bmp PEEP:  [5 cmH20] 5 cmH20  CARDIOVASCULAR A: --Continue hypotension with shock.  P:  Echo 05/14/16; EF=55%.   HEMODYNAMICS:    RENAL A:  Acute oliguric renal failure secondary to septic shock, currently on CRRT.  P:   --Patient making no progress, will DC CRRT.    INTAKE / OUTPUT:  Intake/Output Summary (Last 24 hours) at 06/05/16 0746 Last data filed at 06/05/16 0522  Gross per 24 hour  Intake          3081.86 ml  Output             3492 ml  Net          -410.14 ml    GASTROINTESTINAL A:  CT AP 06/02/16; c/w SBO.  P:   Continue decompression.   HEMATOLOGIC A: --Anemia and thrombocytopenia.  P:  --Likely multifactorial from critical care anemia and CRRT.  --Hold transfusion as patient is unlikely to improve and would suggest comfort measures at this point.  --HIT panel pending, hold heparin.   INFECTIOUS A:  Septic shock.  P:   Continue broad spectrum abx.   Micro/culture results: BCx: NGTD TA: abundant yeast, rare mold 5/23 UCx: yeast 5/25 Ucx: yeast  MRSA PCR: negative C diff: sent GI PCR: sent  Antibiotics: vanc 5/22 >>  levofloxacin 5/22 >> 5/23 Cefepime 5/22 >> Metronidazole 5/23 >> Doxycycline >>  ENDOCRINE A:   Hyperglycemia.    P:   Continue SSI.   NEUROLOGIC A:  Acute metabolic encephalopathy.  P:   RASS goal -1   MAJOR EVENTS/TEST RESULTS:   Best Practices  DVT Prophylaxis: SCD GI Prophylaxis: protonix   --------------------------------------- Advance Care Planning: --Discussed plan with husband Laura Kline this am, he has had discussion with palliative care yesterday with change to DNR status and possible withdrawal after 48 hrs. Explained that she has continued to do poorly, with a drop in her Hb and platelets. I explained that we are stopping CRRT, as this does not appear to be helping her.  I recommended withdrawal of care in 24 hours as per yesterday's plan, and he appeared to understand though he did not specifically endorse it. Would recommend further discussion about withdrawal with palliative care.   ----------------------------------------   Name: Laura Kline MRN: [id number] DOB: 02/20/1955    ADMISSION DATE:  05/24/2016   SUBJECTIVE:   Pt currently on the ventilator, can not provide history or review of systems. She is sedated and unresponsive.   Review of Systems:  --  VITAL SIGNS: Temp:  [97.9 F (36.6 C)-98.7 F (37.1 C)] 98.6 F (37 C) (05/29 0400) Pulse Rate:  [96-120] 96 (05/29 0700) Resp:  [19-31] 21 (05/29 0700) BP: (89-141)/(43-109) 89/49 (05/29 0700) SpO2:  [90 %-100 %] 100 % (05/29 0718) FiO2 (%):  [28 %] 28 % (  05/29 0718) Weight:  [371 lb (168.3 kg)] 371 lb (168.3 kg) (05/29 0218)     PHYSICAL EXAMINATION: Physical Examination:   VS: BP (!) 89/49   Pulse 96   Temp 98.6 F (37 C) (Oral)   Resp (!) 21   Ht 5' (1.524 m)   Wt (!) 371 lb (168.3 kg)   SpO2 100%   BMI 72.46 kg/m   General Appearance: No distress  Neuro:without focal findings, mental status reduced, does not follow commands.  HEENT: PERRLA, EOM intact. Pulmonary: decreased breath sounds bilaterally.  CardiovascularNormal S1,S2.  No m/r/g.   Abdomen: Benign, Soft,  non-tender. Renal:  No costovertebral tenderness  GU:  Not performed at this time. Endocrine: No evident thyromegaly. Skin:   warm, no rashes, no ecchymosis  Extremities: normal, no cyanosis, clubbing.    LABORATORY PANEL:   CBC  Recent Labs Lab 06/05/16 0453  WBC 2.8*  HGB 6.5*  HCT 19.4*  PLT 28*    Chemistries   Recent Labs Lab 06/03/16 1502  06/05/16 0156  NA  --   < > 139  K  --   < > 4.3  CL  --   < > 106  CO2  --   < > 24  GLUCOSE  --   < > 189*  BUN  --   < > 16  CREATININE  --   < > 0.86  CALCIUM  --   < > 8.9  MG  --   < > 1.9  PHOS  --   < > 1.4*  AST 31  --   --   ALT 20  --   --   ALKPHOS 78  --   --   BILITOT 4.5*  --   --   < > = values in this interval not displayed.   Recent Labs Lab 06/04/16 1113 06/04/16 1618 06/04/16 2004 06/04/16 2349 06/05/16 0341 06/05/16 0740  GLUCAP 202* 179* 168* 186* 175* 183*    Recent Labs Lab 05/30/16 0431 06/02/16 0320 06/05/16 0500  PHART 7.21* 7.50* 7.46*  PCO2ART 47 28* 29*  PO2ART 107 121* 133*    Recent Labs Lab 05/31/16 0541  06/01/16 0256  06/03/16 1502  06/04/16 1428 06/04/16 2011 06/05/16 0156  AST 39  --  47*  --  31  --   --   --   --   ALT 26  --  29  --  20  --   --   --   --   ALKPHOS 82  --  83  --  78  --   --   --   --   BILITOT 2.9*  --  2.9*  --  4.5*  --   --   --   --   ALBUMIN 1.9*  < > 1.9*  2.0*  < > 2.8*  < > 2.9* 3.1* 3.4*  < > = values in this interval not displayed.  Cardiac Enzymes  Recent Labs Lab Jun 07, 2016 1503  TROPONINI 0.04*    RADIOLOGY:  Dg Chest 1 View  Result Date: 06/05/2016 CLINICAL DATA:  Shortness of breath. EXAM: CHEST 1 VIEW COMPARISON:  06/03/2016. FINDINGS: Endotracheal tube, NG tube, right subclavian line in stable position. Mild bibasilar subsegmental atelectasis/infiltrates noted. Mild left base atelectasis has slightly improved. No pleural effusion or pneumothorax . IMPRESSION: 1. Lines and tubes in stable position. 2. Bibasilar  subsegmental atelectasis/infiltrate. Mild left base atelectasis has improved slightly from prior exam .  Electronically Signed   By: Maisie Fus  Register   On: 06/05/2016 06:55   Dg Chest Port 1 View  Result Date: 06/03/2016 CLINICAL DATA:  Vent EXAM: PORTABLE CHEST 1 VIEW COMPARISON:  06/02/2016 FINDINGS: Endotracheal tube terminates 3 cm above the carina. Mild patchy left lower lobe opacity, likely atelectasis. Pulmonary vascular congestion. No frank interstitial edema. No pleural effusion or pneumothorax. The heart is top-normal in size for inspiration. IMPRESSION: Endotracheal tube terminates 3 cm above the carina. Mild patchy left lower lobe opacity, likely atelectasis. Electronically Signed   By: Charline Bills M.D.   On: 06/03/2016 09:26        --Wells Guiles, MD.  ICU Pager: [phone]  Pulmonary and Critical Care Office Number: [phone]   06/05/2016   Critical Care Attestation.  I have personally obtained a history, examined the patient, evaluated laboratory and imaging results, formulated the assessment and plan and placed orders. The Patient requires high complexity decision making for assessment and support, frequent evaluation and titration of therapies, application of advanced monitoring technologies and extensive interpretation of multiple databases. The patient has critical illness that could lead imminently to failure of 1 or more organ systems and requires the highest level of physician preparedness to intervene.  Critical Care Time devoted to patient care services described in this note is 35 minutes and is exclusive of time spent in procedures.

## 2016-06-05 NOTE — Progress Notes (Signed)
Patient ID: Laura Kline, female   DOB: 04/27/1955, 61 y.o.   MRN: [id number]  [facility] PROGRESS NOTE  Laura Kline JGO:[id number] DOB: 1955-10-14 DOA: 05/15/2016 PCP: Patient, No Pcp Per  HPI/Subjective: Patient intubated and sedated  Objective: Vitals:   06/05/16 1100 06/05/16 1200  BP: (!) 100/56 (!) 98/54  Pulse: 100 (!) 103  Resp: 20 (!) 24  Temp:      Filed Weights   06/03/16 0300 06/04/16 0500 06/05/16 0218  Weight: (!) 166.5 kg (367 lb) (!) 163.7 kg (361 lb) (!) 168.3 kg (371 lb)    ROS: Review of Systems  Unable to perform ROS: Critical illness   Exam: Physical Exam  Constitutional: She appears lethargic. She is intubated.  HENT:  Nose: No mucosal edema.  Eyes: Lids are normal. Pupils are equal, round, and reactive to light.  Neck: Carotid bruit is not present. No thyroid mass present.  Cardiovascular: Regular rhythm, S1 normal, S2 normal and normal heart sounds.   Pulses:      Dorsalis pedis pulses are 1+ on the right side, and 1+ on the left side.  Respiratory: She is intubated. She has decreased breath sounds in the right middle field, the right lower field, the left middle field and the left lower field. She has no wheezes. She has no rhonchi. She has no rales.  GI: Soft. Bowel sounds are normal. There is no tenderness.  Musculoskeletal:       Right ankle: She exhibits swelling.       Left ankle: She exhibits swelling.  Neurological: She appears lethargic.  Unresponsive to sternal rub  Skin: Skin is warm. No rash noted. No cyanosis.  Psychiatric:  Unresponsive to sternal rub      Data Reviewed: Basic Metabolic Panel:  Recent Labs Lab 06/04/16 0229 06/04/16 0817 06/04/16 1428 06/04/16 1430 06/04/16 2011 06/05/16 0156  NA 137 135 137  --  138 139  K 4.0 4.3 4.1  --  4.2 4.3  CL 105 104 104  --  107 106  CO2 23 21* 25  --  23 24  GLUCOSE 182* 216* 182*  --  184* 189*  BUN _0 --  16 16  CREATININE 0.88 1.01* 0.91   --  0.86 0.86  CALCIUM 8.5* 8.8* 8.9  --  8.7* 8.9  MG 1.6* 2.1  --  1.9 1.8 1.9  PHOS 1.7* 1.8* 1.4*  --  1.4* 1.4*   Liver Function Tests:  Recent Labs Lab 05/30/2016 1503 05/30/16 0529  05/31/16 0541  06/01/16 0256  06/03/16 1502  06/04/16 0229 06/04/16 0817 06/04/16 1428 06/04/16 2011 06/05/16 0156  AST 34 44*  --  39  --  47*  --  31  --   --   --   --   --   --   ALT 30 29  --  26  --  29  --  20  --   --   --   --   --   --   ALKPHOS 89 81  --  82  --  83  --  78  --   --   --   --   --   --   BILITOT 3.0* 4.0*  --  2.9*  --  2.9*  --  4.5*  --   --   --   --   --   --   PROT 5.3* 4.5*  --  4.5*  --  4.4*  --  4.6*  --   --   --   --   --   --   ALBUMIN 2.1* 1.8*  < > 1.9*  < > 1.9*  2.0*  < > 2.8*  < > 2.8* 2.8* 2.9* 3.1* 3.4*  < > = values in this interval not displayed.  Recent Labs Lab 05/28/2016 1503 05/30/2016 2050  LIPASE 16  --   AMYLASE  --  71    Recent Labs Lab 06/03/16 1502  AMMONIA 13   CBC:  Recent Labs Lab 06/03/2016 1503  05/31/16 0541 06/01/16 0256 06/02/16 0816 06/04/16 1430 06/05/16 0453  WBC 19.8*  < > 9.3 4.8 4.5 2.0* 2.8*  NEUTROABS 14.0*  --   --   --   --   --   --   HGB 14.4  < > 11.9* 11.3* 10.5* 6.6* 6.5*  HCT 44.6  < > 34.6* 32.4* 30.6* 19.3* 19.4*  MCV 95.5  < > 92.5 92.3 90.3 93.9 93.5  PLT 438  < > 151 110* 68* 27* 28*  < > = values in this interval not displayed. Cardiac Enzymes:  Recent Labs Lab 05/12/2016 1503  TROPONINI 0.04*   BNP (last 3 results) No results for input(s): BNP in the last 8760 hours.  ProBNP (last 3 results) No results for input(s): PROBNP in the last 8760 hours.  CBG:  Recent Labs Lab 06/04/16 2004 06/04/16 2349 06/05/16 0341 06/05/16 0740 06/05/16 1227  GLUCAP 168* 186* 175* 183* 216*    Recent Results (from the past 240 hour(s))  C difficile quick scan w PCR reflex     Status: None   Collection Time: 05/26/16  1:30 PM  Result Value Ref Range Status   C Diff antigen NEGATIVE  NEGATIVE Final   C Diff toxin NEGATIVE NEGATIVE Final   C Diff interpretation No C. difficile detected.  Final  Gastrointestinal Panel by PCR , Stool     Status: None   Collection Time: 05/26/16  6:19 PM  Result Value Ref Range Status   Campylobacter species NOT DETECTED NOT DETECTED Final   Plesimonas shigelloides NOT DETECTED NOT DETECTED Final   Salmonella species NOT DETECTED NOT DETECTED Final   Yersinia enterocolitica NOT DETECTED NOT DETECTED Final   Vibrio species NOT DETECTED NOT DETECTED Final   Vibrio cholerae NOT DETECTED NOT DETECTED Final   Enteroaggregative E coli (EAEC) NOT DETECTED NOT DETECTED Final   Enteropathogenic E coli (EPEC) NOT DETECTED NOT DETECTED Final   Enterotoxigenic E coli (ETEC) NOT DETECTED NOT DETECTED Final   Shiga like toxin producing E coli (STEC) NOT DETECTED NOT DETECTED Final   Shigella/Enteroinvasive E coli (EIEC) NOT DETECTED NOT DETECTED Final   Cryptosporidium NOT DETECTED NOT DETECTED Final   Cyclospora cayetanensis NOT DETECTED NOT DETECTED Final   Entamoeba histolytica NOT DETECTED NOT DETECTED Final   Giardia lamblia NOT DETECTED NOT DETECTED Final   Adenovirus F40/41 NOT DETECTED NOT DETECTED Final   Astrovirus NOT DETECTED NOT DETECTED Final   Norovirus GI/GII NOT DETECTED NOT DETECTED Final   Rotavirus A NOT DETECTED NOT DETECTED Final   Sapovirus (I, II, IV, and V) NOT DETECTED NOT DETECTED Final  Blood Culture (routine x 2)     Status: None   Collection Time: 05/15/2016  3:03 PM  Result Value Ref Range Status   Specimen Description BLOOD L AC  Final   Special Requests BOTTLES DRAWN AEROBIC AND ANAEROBIC BCAV  Final   Culture NO  GROWTH 5 DAYS  Final   Report Status 06/03/2016 FINAL  Final  Blood Culture (routine x 2)     Status: None   Collection Time: 05/25/2016  4:42 PM  Result Value Ref Range Status   Specimen Description BLOOD RIGHT NECK  Final   Special Requests   Final    BOTTLES DRAWN AEROBIC AND ANAEROBIC Blood Culture  results may not be optimal due to an excessive volume of blood received in culture bottles   Culture NO GROWTH 5 DAYS  Final   Report Status 06/03/2016 FINAL  Final  MRSA PCR Screening     Status: None   Collection Time: 05/25/2016  8:35 PM  Result Value Ref Range Status   MRSA by PCR NEGATIVE NEGATIVE Final    Comment:        The GeneXpert MRSA Assay (FDA approved for NASAL specimens only), is one component of a comprehensive MRSA colonization surveillance program. It is not intended to diagnose MRSA infection nor to guide or monitor treatment for MRSA infections.   Urine culture     Status: Abnormal   Collection Time: 05/30/16  1:51 AM  Result Value Ref Range Status   Specimen Description URINE, RANDOM  Final   Special Requests NONE  Final   Culture >=100,000 COLONIES/mL YEAST (A)  Final   Report Status 05/31/2016 FINAL  Final  Culture, respiratory (NON-Expectorated)     Status: None   Collection Time: 05/30/16  2:45 AM  Result Value Ref Range Status   Specimen Description TRACHEAL ASPIRATE  Final   Special Requests NONE  Final   Gram Stain   Final    FEW WBC PRESENT, PREDOMINANTLY MONONUCLEAR MODERATE YEAST FEW GRAM POSITIVE COCCI RARE GRAM VARIABLE ROD Performed at [facility], [address], [place], [place] [address]    Culture   Final    ABUNDANT CANDIDA ALBICANS RARE FUNGUS (MOLD) ISOLATED, PROBABLE CONTAMINANT/COLONIZER (SAPROPHYTE). CONTACT MICROBIOLOGY IF FURTHER IDENTIFICATION REQUIRED [phone].    Report Status 06/02/2016 FINAL  Final  Urine culture     Status: Abnormal   Collection Time: 06/01/16 12:18 PM  Result Value Ref Range Status   Specimen Description URINE, RANDOM  Final   Special Requests NONE  Final   Culture >=100,000 COLONIES/mL YEAST (A)  Final   Report Status 06/02/2016 FINAL  Final  C difficile quick scan w PCR reflex     Status: None   Collection Time: 06/01/16 12:18 PM  Result Value Ref Range Status   C Diff antigen NEGATIVE  NEGATIVE Final   C Diff toxin NEGATIVE NEGATIVE Final   C Diff interpretation No C. difficile detected.  Final  Gastrointestinal Panel by PCR , Stool     Status: None   Collection Time: 06/01/16 12:18 PM  Result Value Ref Range Status   Campylobacter species NOT DETECTED NOT DETECTED Final   Plesimonas shigelloides NOT DETECTED NOT DETECTED Final   Salmonella species NOT DETECTED NOT DETECTED Final   Yersinia enterocolitica NOT DETECTED NOT DETECTED Final   Vibrio species NOT DETECTED NOT DETECTED Final   Vibrio cholerae NOT DETECTED NOT DETECTED Final   Enteroaggregative E coli (EAEC) NOT DETECTED NOT DETECTED Final   Enteropathogenic E coli (EPEC) NOT DETECTED NOT DETECTED Final   Enterotoxigenic E coli (ETEC) NOT DETECTED NOT DETECTED Final   Shiga like toxin producing E coli (STEC) NOT DETECTED NOT DETECTED Final   Shigella/Enteroinvasive E coli (EIEC) NOT DETECTED NOT DETECTED Final   Cryptosporidium NOT DETECTED  NOT DETECTED Final   Cyclospora cayetanensis NOT DETECTED NOT DETECTED Final   Entamoeba histolytica NOT DETECTED NOT DETECTED Final   Giardia lamblia NOT DETECTED NOT DETECTED Final   Adenovirus F40/41 NOT DETECTED NOT DETECTED Final   Astrovirus NOT DETECTED NOT DETECTED Final   Norovirus GI/GII NOT DETECTED NOT DETECTED Final   Rotavirus A NOT DETECTED NOT DETECTED Final   Sapovirus (I, II, IV, and V) NOT DETECTED NOT DETECTED Final    Comment: INTERPRET RESULTS WITH CAUTION. SUBOPTIMAL SPECIMEN, >100 DAYS OLD.  SGD  Culture, respiratory (NON-Expectorated)     Status: None   Collection Time: 06/02/16  8:35 AM  Result Value Ref Range Status   Specimen Description TRACHEAL ASPIRATE  Final   Special Requests NONE  Final   Gram Stain   Final    MODERATE WBC PRESENT,BOTH PMN AND MONONUCLEAR MODERATE YEAST Performed at [facility], [place] [address] [place], [place] [address]    Culture FEW CANDIDA ALBICANS  Final   Report Status 06/04/2016 FINAL  Final      Studies: Dg Chest 1 View  Result Date: 06/05/2016 CLINICAL DATA:  Shortness of breath. EXAM: CHEST 1 VIEW COMPARISON:  06/03/2016. FINDINGS: Endotracheal tube, NG tube, right subclavian line in stable position. Mild bibasilar subsegmental atelectasis/infiltrates noted. Mild left base atelectasis has slightly improved. No pleural effusion or pneumothorax . IMPRESSION: 1. Lines and tubes in stable position. 2. Bibasilar subsegmental atelectasis/infiltrate. Mild left base atelectasis has improved slightly from prior exam . Electronically Signed   By: [place]   On: 06/05/2016 06:55    Scheduled Meds: . budesonide (PULMICORT) nebulizer solution  0.25 mg Nebulization Q6H  . chlorhexidine gluconate (MEDLINE KIT)  15 mL Mouth Rinse BID  . doxycycline  100 mg Oral Q12H  . feeding supplement (PRO-STAT SUGAR FREE 64)  30 mL Oral BID  . insulin aspart  0-20 Units Subcutaneous Q4H  . ipratropium-albuterol  3 mL Nebulization Q6H  . mouth rinse  15 mL Mouth Rinse 10 times per day  . metoCLOPramide (REGLAN) injection  10 mg Intravenous Q8H  . pantoprazole sodium  40 mg Per Tube Q1200  . sodium chloride flush  10-40 mL Intracatheter Q12H   Continuous Infusions: . anticoagulant sodium citrate    . ceFEPime (MAXIPIME) IV Stopped (06/05/16 0309)  . fentaNYL 200 mcg/hr (06/05/16 0920)  . fluconazole (DIFLUCAN) IV Stopped (06/04/16 2055)  . metronidazole Stopped (06/05/16 0853)  . norepinephrine (LEVOPHED) Adult infusion Stopped (06/04/16 0830)  . sodium chloride Stopped (05/30/16 1212)  . vasopressin (PITRESSIN) infusion - *FOR SHOCK* 0.03 Units/min (06/04/16 1926)    Assessment/Plan:  1. Septic shock with multiorgan failure. Unclear source at this time. Patient on IV cefepime, Flagyl, doxycycline and fluconazole. Patient is on vasopressin. Overall prognosis is poor. 2. Acute hypoxic respiratory failure with altered mental status. Patient on ventilator 28% FiO2. 3. Acute anuric kidney  injury. CRRT stopped today 4. Metabolic acidosis secondary to septic shock and renal failure 5. Severe thrombocytopenia and anemia. Could be DIC versus HIT. Heparin stopped. 6. Type 2 diabetes mellitus without complication on sliding scale insulin  Case discussed with palliative care team. Plan of care is going to be comfort care measures and likely withdrawal of care tomorrow.  Code Status:     Code Status Orders        Start     Ordered   06/04/16 1125  Do not attempt resuscitation (DNR)  Continuous    Question Answer Comment  In the event of cardiac or respiratory ARREST Do not call a "code blue"   In the event of cardiac or respiratory ARREST Do not perform Intubation, CPR, defibrillation or ACLS   In the event of cardiac or respiratory ARREST Use medication by any route, position, wound care, and other measures to relive pain and suffering. May use oxygen, suction and manual treatment of airway obstruction as needed for comfort.      06/04/16 1124    Code Status History    Date Active Date Inactive Code Status Order ID Comments User Context   06/07/2016  8:33 PM 06/04/2016 11:24 AM Full Code [id number]  Nicholes Mango, MD Inpatient   05/26/2016  5:37 AM 05/28/2016  9:27 PM Full Code [id number]  Harrie Foreman, MD Inpatient   05/12/2016  3:28 PM 05/18/2016 10:20 PM Full Code [id number]  Henreitta Leber, MD Inpatient   07/26/2014 12:21 PM 07/28/2014  6:58 PM Full Code [id number]  Epifanio Lesches, MD ED     Family Communication: As per critical care specialist Disposition Plan: Likely of withdrawal of care tomorrow  Consultants:  Critical care specialist  Infectious disease  Nephrology  Palliative care  Time spent: 25 minutes  [facility]

## 2016-06-05 NOTE — Progress Notes (Signed)
Pt remains sedated and intubated. CRRT running without problems. VSS no issues tonight

## 2016-06-05 NOTE — Progress Notes (Signed)
Pharmacy Antibiotic Note/CRRT Medication Adjusment  Laura RootsSabrina [name] Kline is a 61 y.o. female with a recent I&D of left foot abscess admitted on 05/18/2016 with sepsis/unclear source.  Pharmacy has been consulted for vancomycin and cefepime and Fluconazole dosing.  CRRT stopped.  Patient is also ordered doxycycline and metronidazole per ID.   Vancomycin d/c 5/25 per ID.  Plan: 1. Will transition patient to cefepime 1 g iv q 24 hours and fluconazole to 400 mg iv q 48 hours.   2. Will change dose of Reglan to q 12 hours for ESRD dosing with CRRT stopped.    Height: 5' (152.4 cm) Weight: (!) 371 lb (168.3 kg) IBW/kg (Calculated) : 45.5  Temp (24hrs), Avg:98.3 F (36.8 C), Min:97.9 F (36.6 C), Max:98.7 F (37.1 C)   Recent Labs Lab 05/16/2016 1503 05/09/2016 1900  05/31/16 0541  06/01/16 0256  06/01/16 2010  06/02/16 0430  06/02/16 0816  06/04/16 0229 06/04/16 0817 06/04/16 1428 06/04/16 1430 06/04/16 2011 06/05/16 0156 06/05/16 0453  WBC 19.8*  --   < > 9.3  --  4.8  --   --   --   --   --  4.5  --   --   --   --  2.0*  --   --  2.8*  CREATININE 3.42*  --   < > 2.55*  < > 1.75*  1.79*  < > 1.46*  < >  --   < > 1.26*  1.27*  < > 0.88 1.01* 0.91  --  0.86 0.86  --   LATICACIDVEN 4.8* 2.1*  --   --   --   --   --   --   --  3.3*  --   --   --   --   --   --   --   --   --   --   VANCORANDOM  --   --   --   --   --   --   --  10  --   --   --   --   --   --   --   --   --   --   --   --   < > = values in this interval not displayed.  Estimated Creatinine Clearance: 101.3 mL/min (by C-G formula based on SCr of 0.86 mg/dL).    Allergies  Allergen Reactions  . Rofecoxib Other (See Comments)    Reaction: unknown  . Penicillins Rash and Other (See Comments)    Has patient had a PCN reaction causing immediate rash, facial/tongue/throat swelling, SOB or lightheadedness with hypotension: Unknown Has patient had a PCN reaction causing severe rash involving mucus membranes or skin  necrosis: Unknown Has patient had a PCN reaction that required hospitalization: Unknown Has patient had a PCN reaction occurring within the last 10 years: Unknown If all of the above answers are "NO", then may proceed with Cephalosporin use.     Antimicrobials this admission: vanc 5/22 >> 5/29 levofloxacin 5/22 >> 5/23 Cefepime 5/22 >> Metronidazole 5/23 >> Doxycycline 5/23 >> Fluconazole 5/25 >>  Dose adjustments this admission:  Microbiology results: BCx: NG TA: abundant yeast, rare mold 5/23 UCx: yeast 5/25 Ucx: yeast  MRSA PCR: negative C diff: negative GI ZOX:[id number]  Thank you for allowing pharmacy to be a part of this patient's care.  Luisa Harthristy, Roberto Romanoski D Clinical Pharmacist  06/05/2016 1:37 PM

## 2016-06-05 NOTE — Plan of Care (Signed)
Problem: Nutrition: Goal: Adequate nutrition will be maintained Outcome: Not Progressing Pt remains NPO   

## 2016-06-05 NOTE — Progress Notes (Addendum)
Daily Progress Note   Patient Name: Laura Kline       Date: 06/05/2016 DOB: [birth year]  Age: 61 y.o. MRN#: [id number] Attending Physician: Loletha Grayer, MD Primary Care Physician: Patient, No Pcp Per Admit Date: 05/12/2016  Reason for Consultation/Follow-up: Establishing goals of care  Subjective: [name] remains intubated. Husband, [name], at bedside.   Length of Stay: 7  Current Medications: Scheduled Meds:  . budesonide (PULMICORT) nebulizer solution  0.25 mg Nebulization Q6H  . chlorhexidine gluconate (MEDLINE KIT)  15 mL Mouth Rinse BID  . doxycycline  100 mg Oral Q12H  . feeding supplement (PRO-STAT SUGAR FREE 64)  30 mL Oral BID  . insulin aspart  0-20 Units Subcutaneous Q4H  . ipratropium-albuterol  3 mL Nebulization Q6H  . mouth rinse  15 mL Mouth Rinse 10 times per day  . metoCLOPramide (REGLAN) injection  10 mg Intravenous Q8H  . pantoprazole sodium  40 mg Per Tube Q1200  . sodium chloride flush  10-40 mL Intracatheter Q12H    Continuous Infusions: . anticoagulant sodium citrate    . ceFEPime (MAXIPIME) IV Stopped (06/05/16 0309)  . fentaNYL 200 mcg/hr (06/05/16 0920)  . fluconazole (DIFLUCAN) IV Stopped (06/04/16 2055)  . metronidazole 500 mg (06/05/16 0753)  . norepinephrine (LEVOPHED) Adult infusion Stopped (06/04/16 0830)  . sodium chloride Stopped (05/30/16 1212)  . sodium phosphate  Dextrose 5% IVPB 20 mmol (06/05/16 0518)  . vasopressin (PITRESSIN) infusion - *FOR SHOCK* 0.03 Units/min (06/04/16 1926)    PRN Meds: anticoagulant sodium citrate, bisacodyl, fentaNYL, metoprolol tartrate, midazolam, [DISCONTINUED] ondansetron **OR** ondansetron (ZOFRAN) IV, sennosides, sodium chloride flush  Physical Exam         Well developed, obese  female HENT: intubated on vent support, jaundice Resp: Coarse throughout Abd: Soft, no bowel sounds GI: Oliguric, blood in foley bag, blood noted in flexiseal as well Neuro: Opens eyes but not to command, does not follow commands  Vital Signs: BP (!) 89/49   Pulse 96   Temp 98.6 F (37 C) (Oral)   Resp (!) 21   Ht 5' (1.524 m)   Wt (!) 168.3 kg (371 lb)   SpO2 100%   BMI 72.46 kg/m  SpO2: SpO2: 100 % O2 Device: O2 Device: Ventilator O2 Flow Rate: O2 Flow Rate (L/min): 15 L/min  Intake/output summary:  Intake/Output Summary (Last 24 hours) at 06/05/16 1022 Last data filed at 06/05/16 0522  Gross per 24 hour  Intake          3081.86 ml  Output             3204 ml  Net          -122.14 ml   LBM: Last BM Date: 06/04/16 Baseline Weight: Weight: 136.1 kg (300 lb) Most recent weight: Weight: (!) 168.3 kg (371 lb)       Palliative Assessment/Data:    Flowsheet Rows     Most Recent Value  Intake Tab  Referral Department  Critical care  Unit at Time of Referral  ICU  Palliative Care Primary Diagnosis  Sepsis/Infectious Disease  Date Notified  06/02/16  Palliative Care Type  New Palliative care  Reason for referral  Clarify Goals of Care  Date of Admission  05/22/2016  Date first seen by Palliative Care  06/04/16  # of days Palliative referral response time  2 Day(s)  # of days IP prior to Palliative referral  4  Clinical Assessment  Palliative Performance Scale Score  10%  Psychosocial & Spiritual Assessment  Palliative Care Outcomes  Patient/Family meeting held?  Yes  Who was at the meeting?  husband, close family friend/dtr  Palliative Care Outcomes  Clarified goals of care, Changed CPR status      Patient Active Problem List   Diagnosis Date Noted  . Acute renal failure ([place])   . Respiratory failure ([place])   . Palliative care encounter   . DNR (do not resuscitate)   . Goals of care, counseling/discussion   . Pressure injury of skin 06/03/2016  . Septic shock  ([place]) 06/03/2016  . AKI (acute kidney injury) ([place]) 05/26/2016  . RUQ pain   . Atrial fibrillation with rapid ventricular response ([place])   . Cellulitis of left lower extremity   . Morbid obesity ([place]) 05/17/2016  . Persistent atrial fibrillation ([place]) 05/17/2016  . Sepsis ([place]) 05/12/2016  . Hyperlipidemia   . Essential hypertension   . Coronary atherosclerosis   . Chest pain 07/26/2014    Palliative Care Assessment & Plan   HPI: 61 y.o. female  with past medical history of Afib (on eliquis) PVD, CAD, IDDM, Bells, morbid obesity, recent foot surgery in 05/2016 who was admitted on 05/17/2016 with septic shock (source unclear),and acute renal failure.  Since admission she has developed hyperbilirubinemia, and ileus vs partial SBO.  Assessment: I met with husband, [name], today to follow up from conversation with my partner yesterday. Mr. Dutko is aware that his wife is dying (he tells me that he has already spoken with funeral home). He is tearful and sad but also endorses anger that he cannot fix the situation. He understands that dialysis has been stopped and that we do not expect her renal function to improve. Also aware of her bleeding. He states that he would like to liberate her from the ventilator and pursue comfort care but is hopeful that family will be able to visit with her tomorrow. He also understands that she could die at anytime.   [name]'s priority is comfort care. He does not want to escalate care. Planning for one way extubation tomorrow after family visits.   I spent much time with [name] today as he shared stories about his wife and how he wished their grandchildren were able to get to know her better. Therapeutic listening and emotional support provided.   Recommendations/Plan:  Pain/dyspnea: Continue titratable fentanyl infusion. Increased fentanyl bolus to every 15 min prn 50-100 mcg. Would not change to different opioid especially due to renal failure (avoid morphine as  could have side effects inducing discomfort).   Anxiety: Versed 2 mg every 2 hours prn.   Goals of Care and Additional Recommendations:  Limitations on Scope of Treatment: No Hemodialysis, No Surgical Procedures and No Tracheostomy  Code Status:  DNR  Prognosis:   Hours - Days  Discharge Planning:  Anticipated Hospital Death   Thank you for allowing the Palliative Medicine Team to assist in the care of this patient.   Total Time 1030-1200 67mn Prolonged Time Billed  yes       Greater than 50%  of this time was spent counseling and coordinating care related to the above assessment and plan.  AVinie Sill NP Palliative Medicine Team Pager # [phone](M-F 8a-5p) Team Phone # [phone](Nights/Weekends)

## 2016-06-06 LAB — GLUCOSE, CAPILLARY
Glucose-Capillary: 212 mg/dL — ABNORMAL HIGH (ref 65–99)
Glucose-Capillary: 223 mg/dL — ABNORMAL HIGH (ref 65–99)

## 2016-06-06 MED ORDER — GLYCOPYRROLATE 0.2 MG/ML IJ SOLN
0.2000 mg | INTRAMUSCULAR | Status: DC
  Filled 2016-06-06 (×2): qty 1

## 2016-06-06 MED ORDER — VECURONIUM BROMIDE 10 MG IV SOLR
10.0000 mg | Freq: Once | INTRAVENOUS | Status: AC
  Administered 2016-06-06: 10 mg via INTRAVENOUS

## 2016-06-06 MED ORDER — MIDAZOLAM HCL 2 MG/2ML IJ SOLN
1.0000 mg | Freq: Once | INTRAMUSCULAR | Status: AC
  Administered 2016-06-06: 1 mg via INTRAVENOUS

## 2016-06-06 MED ORDER — GLYCOPYRROLATE 0.2 MG/ML IJ SOLN
0.4000 mg | Freq: Once | INTRAMUSCULAR | Status: AC
  Administered 2016-06-06: 0.4 mg via INTRAVENOUS

## 2016-06-06 MED ORDER — VECURONIUM BROMIDE 10 MG IV SOLR
INTRAVENOUS | Status: AC
  Filled 2016-06-06: qty 10

## 2016-06-06 MED ORDER — POLYVINYL ALCOHOL 1.4 % OP SOLN: 1.0000 [drp] | Freq: Four times a day (QID) | OPHTHALMIC | Status: DC | PRN

## 2016-06-06 MED ORDER — BIOTENE DRY MOUTH MT LIQD: 15.0000 mL | OROMUCOSAL | Status: DC | PRN

## 2016-06-08 NOTE — Progress Notes (Signed)
Pt continues to decline. D/w palliative care, plan for terminal extubation today.  Wells Guiles-Deep Niurka Benecke, M.D. 05/20/2016

## 2016-06-08 NOTE — Progress Notes (Signed)
Daily Progress Note   Patient Name: Laura Kline       Date: 06-16-16 DOB: 1955-01-27  Age: 61 y.o. MRN#: [id number] Attending Physician: Loletha Grayer, MD Primary Care Physician: Patient, No Pcp Per Admit Date: 05/21/2016  Reason for Consultation/Follow-up: Establishing goals of care  Subjective: [name] remains on vent. Unresponsive today. Was agitated when decreased fentanyl this morning per RN.   Length of Stay: 8  Current Medications: Scheduled Meds:  . budesonide (PULMICORT) nebulizer solution  0.25 mg Nebulization Q6H  . chlorhexidine gluconate (MEDLINE KIT)  15 mL Mouth Rinse BID  . glycopyrrolate  0.2 mg Intravenous Q4H  . ipratropium-albuterol  3 mL Nebulization Q6H  . mouth rinse  15 mL Mouth Rinse 10 times per day  . metoCLOPramide (REGLAN) injection  10 mg Intravenous Q12H  . pantoprazole sodium  40 mg Per Tube Q1200  . sodium chloride flush  10-40 mL Intracatheter Q12H    Continuous Infusions: . fentaNYL 250 mcg/hr (06-16-2016 0815)  . fluconazole (DIFLUCAN) IV    . metronidazole Stopped (06/16/2016 0914)  . norepinephrine (LEVOPHED) Adult infusion 13.973 mcg/min (16-Jun-2016 0800)  . sodium chloride Stopped (05/30/16 1212)  . vasopressin (PITRESSIN) infusion - *FOR SHOCK* 0.03 Units/min (06/16/2016 0800)    PRN Meds: antiseptic oral rinse, bisacodyl, fentaNYL, metoprolol tartrate, midazolam, [DISCONTINUED] ondansetron **OR** ondansetron (ZOFRAN) IV, polyvinyl alcohol, sennosides, sodium chloride flush  Physical Exam         Well developed, obese female HENT: intubated on vent support, jaundice Resp: Coarse throughout Abd: Soft, no bowel sounds GI: Oliguric, blood in foley bag, blood noted in flexiseal as well Neuro: Opens eyes but not to command - less  today, does not follow commands  Vital Signs: BP (!) 101/56   Pulse (!) 109   Temp (!) 101 F (38.3 C) (Oral)   Resp (!) 23   Ht 5' (1.524 m)   Wt (!) 157.9 kg (348 lb)   SpO2 98%   BMI 67.96 kg/m  SpO2: SpO2: 98 % O2 Device: O2 Device: Ventilator O2 Flow Rate: O2 Flow Rate (L/min): 15 L/min  Intake/output summary:   Intake/Output Summary (Last 24 hours) at 2016-06-16 1234 Last data filed at 06-16-2016 0800  Gross per 24 hour  Intake          1291.25 ml  Output  565 ml  Net           726.25 ml   LBM: Last BM Date: 06/05/16 Baseline Weight: Weight: 136.1 kg (300 lb) Most recent weight: Weight: (!) 157.9 kg (348 lb)       Palliative Assessment/Data:    Flowsheet Rows     Most Recent Value  Intake Tab  Referral Department  Critical care  Unit at Time of Referral  ICU  Palliative Care Primary Diagnosis  Sepsis/Infectious Disease  Date Notified  06/02/16  Palliative Care Type  New Palliative care  Reason for referral  Clarify Goals of Care  Date of Admission  05/18/2016  Date first seen by Palliative Care  06/04/16  # of days Palliative referral response time  2 Day(s)  # of days IP prior to Palliative referral  4  Clinical Assessment  Palliative Performance Scale Score  10%  Psychosocial & Spiritual Assessment  Palliative Care Outcomes  Patient/Family meeting held?  Yes  Who was at the meeting?  husband, close family friend/dtr  Palliative Care Outcomes  Clarified goals of care, Changed CPR status      Patient Active Problem List   Diagnosis Date Noted  . Acute renal failure ([place])   . Respiratory failure ([place])   . Palliative care encounter   . DNR (do not resuscitate)   . Goals of care, counseling/discussion   . Pressure injury of skin 06/03/2016  . Septic shock ([place]) 06/07/2016  . AKI (acute kidney injury) ([place]) 05/26/2016  . RUQ pain   . Atrial fibrillation with rapid ventricular response ([place])   . Cellulitis of left lower extremity   . Morbid  obesity ([place]) 05/17/2016  . Persistent atrial fibrillation ([place]) 05/17/2016  . Sepsis ([place]) 05/12/2016  . Hyperlipidemia   . Essential hypertension   . Coronary atherosclerosis   . Chest pain 07/26/2014    Palliative Care Assessment & Plan   HPI: 61 y.o. female  with past medical history of Afib (on eliquis) PVD, CAD, IDDM, Bells, morbid obesity, recent foot surgery in 05/2016 who was admitted on 06/01/2016 with septic shock (source unclear),and acute renal failure.  Since admission she has developed hyperbilirubinemia, and ileus vs partial SBO.  Assessment: I met with husband, [name], and son, Eulas Post, at bedside. [name]'s other son is also present. Updated them that unfortunately she has not made any improvement and seems to only be worsening. They have good understanding that she is at EOL. [name] tells me that he is ready to move forward with extubation after Eulas Post gets some time to say goodbye.   RT and RN at bedside to extubate. Orders given. [name] tells me that he prays that she can go quickly and that if we cannot make her better he wants her to be at peace. Emotional support provided and family given space to be with her. [name] also expresses that he is confident that everything was done that could have been done to help her. Remains confident in decision for comfort care.   Notified by RN later that [name] has passed. Gave condolences to family at bedside.   Recommendations/Plan:  Pain/dyspnea: Continue titratable fentanyl infusion - increased to 300 mcg/hr for comfort and bolus given for tachypnea at time of extubation. fentanyl bolus to every 15 min prn 50-100 mcg. Would not change to different opioid especially due to renal failure (avoid morphine as could have side effects inducing discomfort).   Anxiety: Versed 2 mg every 2 hours prn.   Goals of  Care and Additional Recommendations:  Limitations on Scope of Treatment: No Hemodialysis, No Surgical Procedures and No Tracheostomy  Code  Status:  DNR  Prognosis:   Hours - Days  Discharge Planning:  Anticipated Hospital Death   Thank you for allowing the Palliative Medicine Team to assist in the care of this patient.   Total Time 61mn Prolonged Time Billed  no      Greater than 50%  of this time was spent counseling and coordinating care related to the above assessment and plan.  AVinie Sill NP Palliative Medicine Team Pager # [phone](M-F 8a-5p) Team Phone # [phone](Nights/Weekends)

## 2016-06-08 NOTE — Plan of Care (Signed)
Problem: Activity: Goal: Ability to tolerate increased activity will improve Outcome: Not Progressing Pt remains intubated and on vasopressors to maintain adequate BPs. Pt is also on sedation and does not follow commands. Pt remains NPO on low-interm. Gastric suction.

## 2016-06-08 NOTE — Progress Notes (Addendum)
Patient temperature increased to 101, Dr. Nicholos Johnsamachandran notified because there are no orders for tylenol. Patient to be comfort care when family arrives, Dr. Nicholos Johnsamachandran gave no new orders at this time. Trudee KusterBrandi R [place]field

## 2016-06-08 NOTE — Progress Notes (Signed)
[facility] responded to an OR for End of Life. Pt expired and family had left the unit.    2016-05-09 1500  Clinical Encounter Type  Visited With Patient not available  Visit Type Initial;Spiritual support;Death  Referral From Nurse

## 2016-06-08 NOTE — Progress Notes (Signed)
[facility] at 1210, [facility]os John[facility]ach[facility]ran notified. [place] donor services notified. Elink and CCMD also aware. Family at bedside. Trudee KusterBrandi R [place]

## 2016-06-08 NOTE — Progress Notes (Signed)
Pt. Was suctioned prior to extubation for a small amount of thick white. She was extubated without incident for comfort care.

## 2016-06-08 NOTE — Death Summary Note (Signed)
DEATH SUMMARY   Patient Details  Name: Laura Kline MRN: [id number] DOB: 11/14/1955  Admission/Discharge Information   Admit Date:  11-29-16  Date of Death: Date of Death: 06/01/2016  Time of Death: Time of Death: 1210  Length of Stay: 8  Referring Physician: Patient, No Pcp Per   Reason(s) for Hospitalization   Septic shock with possible small bowel obstruction.   Diagnoses  Preliminary cause of death:  Secondary Diagnoses (including complications and co-morbidities):  Active Problems:   Septic shock ([facility])   Pressure injury of skin   Acute renal failure ([facility])   Respiratory failure ([facility])   Palliative care encounter   DNR (do not resuscitate)   Goals of care, counseling/discussion   Brief Hospital Course (including significant findings, care, treatment, and services provided and events leading to death)  Laura Kline is a 61 y.o. year old female who was admitted with septic shock. She was noted to be in ARF, she was started on CRRT but her renal function did not improve, she continued to have essentially no urine output. Her septic shock improved with stabilization of her BP, but she remained unresponsive, anuric with encephalopathy, and floridly volume overloaded.  Palliative care was consulted due to lack of meaningful progress. Ultimately the family decided that the patient would not want to be continued on life support given her very poor chance of a meaningful recovery.    Pertinent Labs and Studies  Significant Diagnostic Studies   Microbiology   Lab Basic Metabolic Panel:  Recent Labs Lab 06/04/16 0229 06/04/16 0817 06/04/16 1428 06/04/16 1430 06/04/16 2011 06/05/16 0156  NA 137 135 137  --  138 139  K 4.0 4.3 4.1  --  4.2 4.3  CL 105 104 104  --  107 106  CO2 23 21* 25  --  23 24  GLUCOSE 182* 216* 182*  --  184* 189*  BUN 19 18 16   --  16 16  CREATININE 0.88 1.01* 0.91  --  0.86 0.86  CALCIUM 8.5* 8.8* 8.9  --  8.7* 8.9  MG 1.6*  2.1  --  1.9 1.8 1.9  PHOS 1.7* 1.8* 1.4*  --  1.4* 1.4*   Liver Function Tests:  Recent Labs Lab 05/31/16 0541  06/01/16 0256  06/03/16 1502  06/04/16 0229 06/04/16 0817 06/04/16 1428 06/04/16 2011 06/05/16 0156  AST 39  --  47*  --  31  --   --   --   --   --   --   ALT 26  --  29  --  20  --   --   --   --   --   --   ALKPHOS 82  --  83  --  78  --   --   --   --   --   --   BILITOT 2.9*  --  2.9*  --  4.5*  --   --   --   --   --   --   PROT 4.5*  --  4.4*  --  4.6*  --   --   --   --   --   --   ALBUMIN 1.9*  < > 1.9*  2.0*  < > 2.8*  < > 2.8* 2.8* 2.9* 3.1* 3.4*  < > = values in this interval not displayed. No results for input(s): LIPASE, AMYLASE in the last 168 hours.  Recent Labs Lab 06/03/16 1502  AMMONIA 13   CBC:  Recent Labs Lab 05/31/16 0541 06/01/16 0256 06/02/16 0816 06/04/16 1430 06/05/16 0453  WBC 9.3 4.8 4.5 2.0* 2.8*  HGB 11.9* 11.3* 10.5* 6.6* 6.5*  HCT 34.6* 32.4* 30.6* 19.3* 19.4*  MCV 92.5 92.3 90.3 93.9 93.5  PLT 151 110* 68* 27* 28*   Cardiac Enzymes: No results for input(s): CKTOTAL, CKMB, CKMBINDEX, TROPONINI in the last 168 hours. Sepsis Labs:  Recent Labs Lab 05/31/16 0541 06/01/16 0256 06/02/16 0430 06/02/16 0455 06/02/16 0816 06/04/16 1430 06/05/16 0453  PROCALCITON 5.03 4.57  --  5.41  --   --   --   WBC 9.3 4.8  --   --  4.5 2.0* 2.8*  LATICACIDVEN  --   --  3.3*  --   --   --   --     Procedures/Operations  CRRT  Kristyana Notte 06/02/2016, 1:04 PM

## 2016-06-08 DEATH — deceased

## 2016-06-13 ENCOUNTER — Ambulatory Visit: Payer: BLUE CROSS/BLUE SHIELD | Admitting: Nurse Practitioner

## 2018-11-16 IMAGING — DX DG FOOT COMPLETE 3+V*L*
3 series · 3 of 3 positions shown · non-contrast
Comparison: None.

CLINICAL DATA: Left foot swelling.

EXAM:
LEFT FOOT - COMPLETE 3+ VIEW

[foot ap]
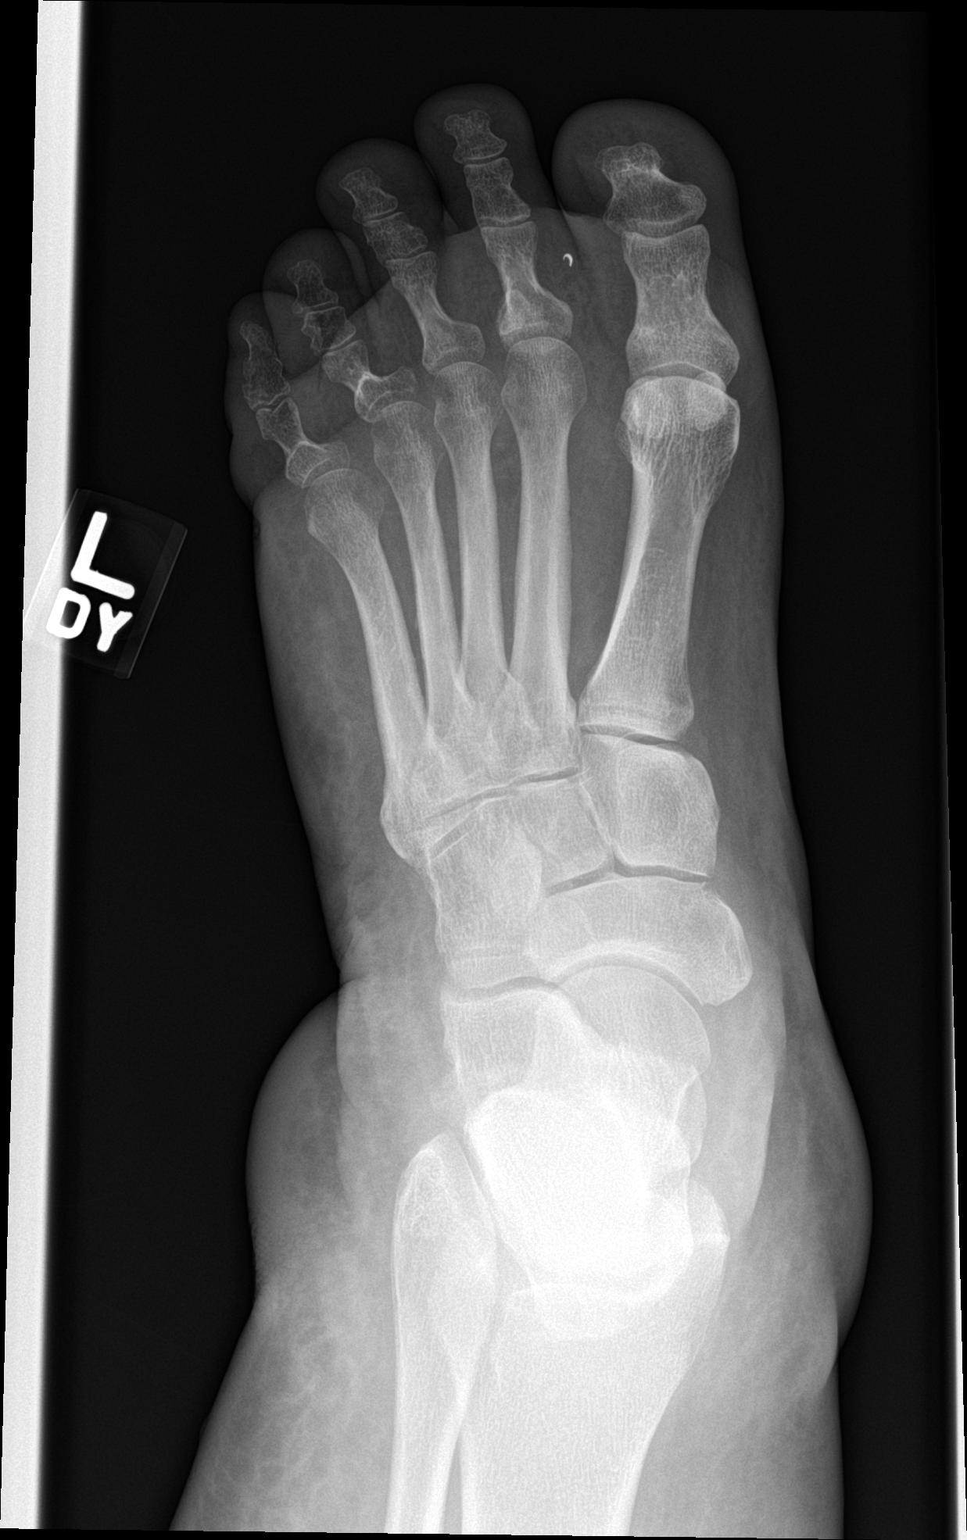

[foot obl]
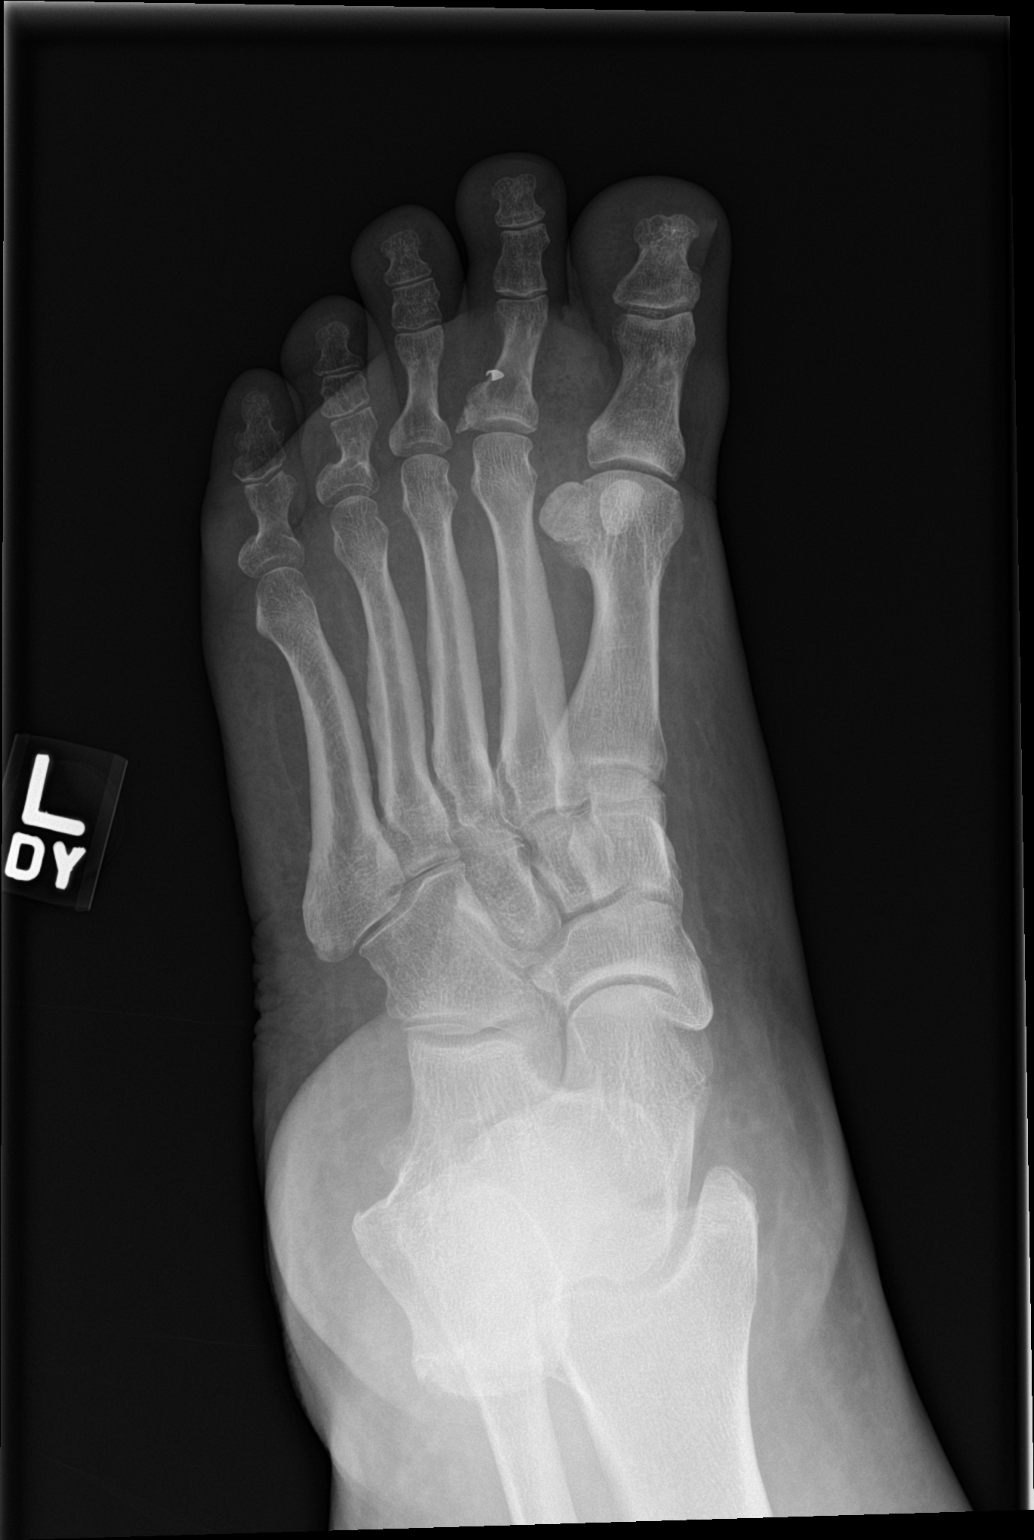

[foot lat]
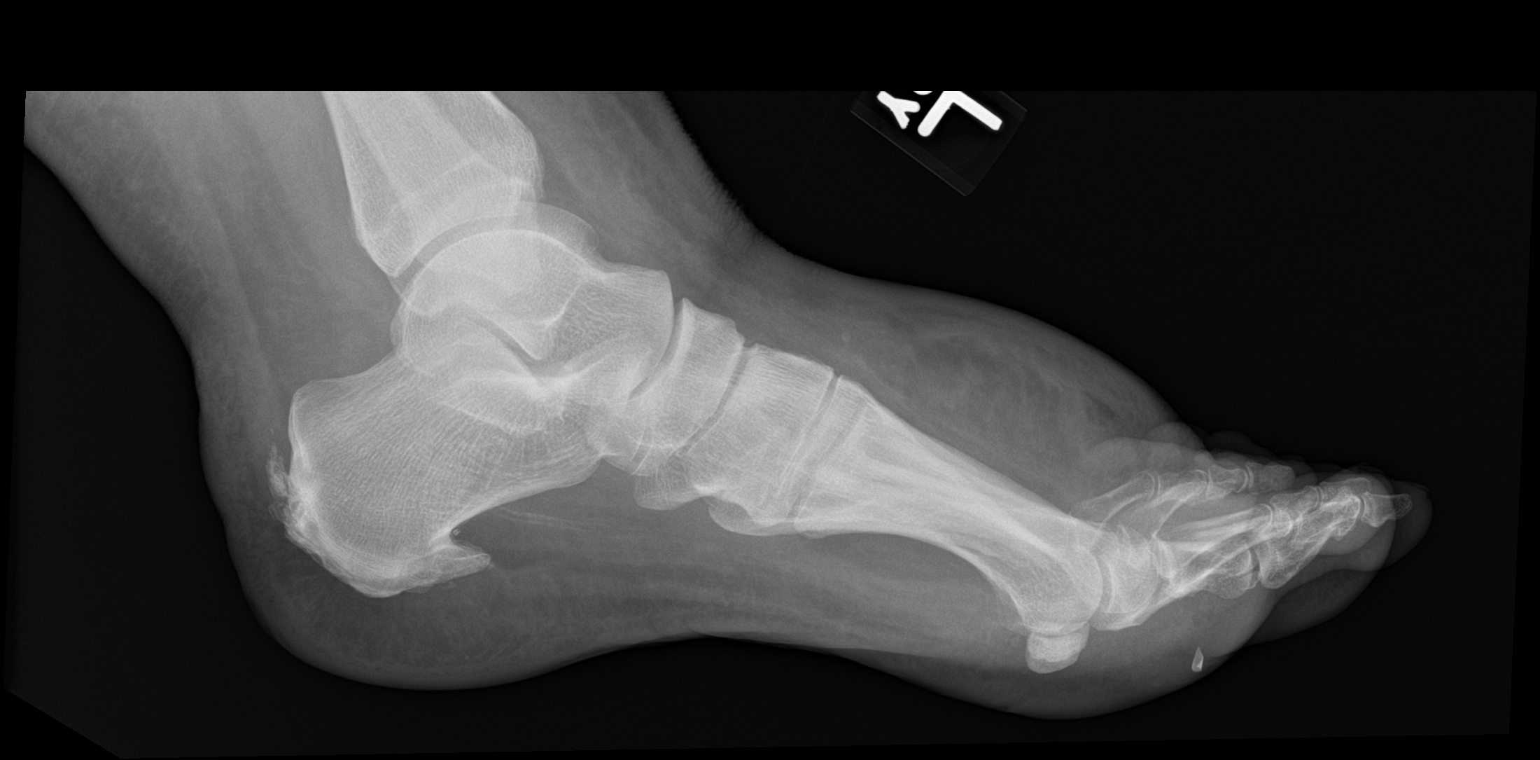

[3 of 3 positions shown; findings below may reference images not displayed]

FINDINGS: There is no evidence of fracture or dislocation. There is no
evidence of arthropathy. Spurring of posterior calcaneus is noted.
Dorsal soft tissue swelling is noted concerning for cellulitis.
Foreign body is seen in plantar soft tissues adjacent to second
proximal phalanx.
IMPRESSION: Radiopaque foreign body is noted in second toe. Dorsal soft tissue
swelling is noted consistent with cellulitis. No fracture or
dislocation is noted.

## 2018-11-16 IMAGING — MR MR FOOT*L* WO/W CM
9 series · 39 of 40 positions shown · IV contrast (multihance)
Comparison: Radiographs 05/12/2016.

CLINICAL DATA: Increasing left foot pain and erythema over the last
week. Cellulitis with foreign body and suspected sepsis.

EXAM:
MRI OF THE LEFT FOREFOOT WITHOUT AND WITH CONTRAST
TECHNIQUE: Multiplanar, multisequence MR imaging of the left forefoot was
performed both before and after administration of intravenous
contrast.
CONTRAST:  20mL MULTIHANCE GADOBENATE DIMEGLUMINE 529 MG/ML IV SOLN

[Series 4: T1 · coronal · 3.0mm · 0.75mm/px · 7 of 40 slices shown (1 of 2)]
[im 1/40]
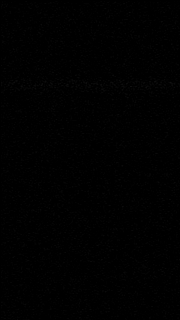
[im 7/40]
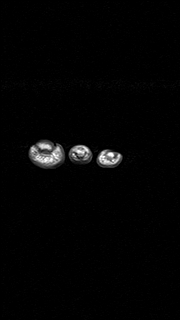
[im 14/40]
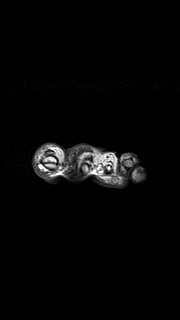
[im 20/40]
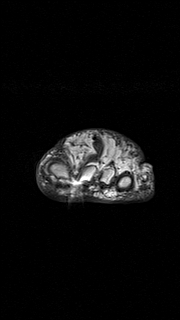
[im 27/40]
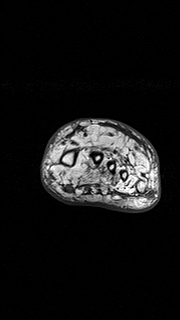
[im 33/40]
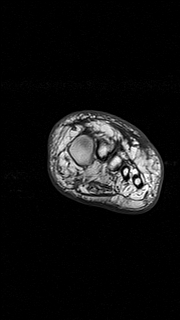
[im 40/40]
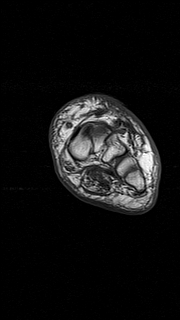

[Series 5: axial t1fs (only · coronal · 3.0mm · 0.94mm/px · 5 of 37 slices shown]
[im 1/37]
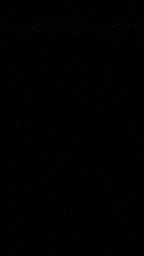
[im 10/37]
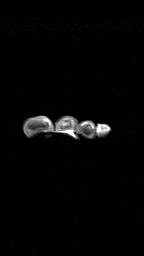
[im 19/37]
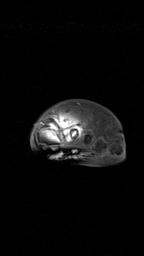
[im 28/37]
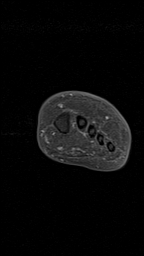
[im 37/37]
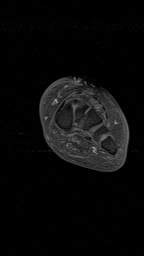

[Series 6: T2 fat-sat · coronal · 3.0mm · 0.43mm/px · 6 of 40 slices shown]
[im 1/40]
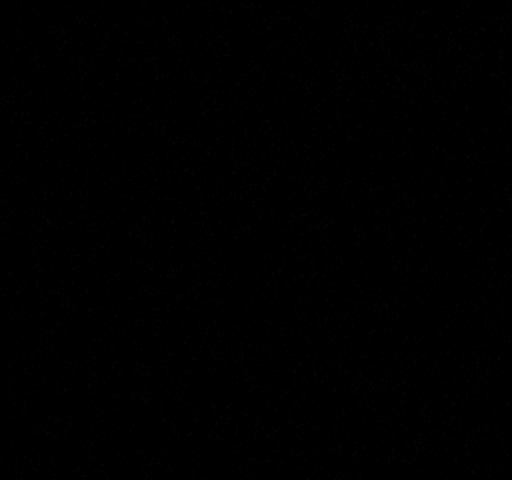
[im 8/40]
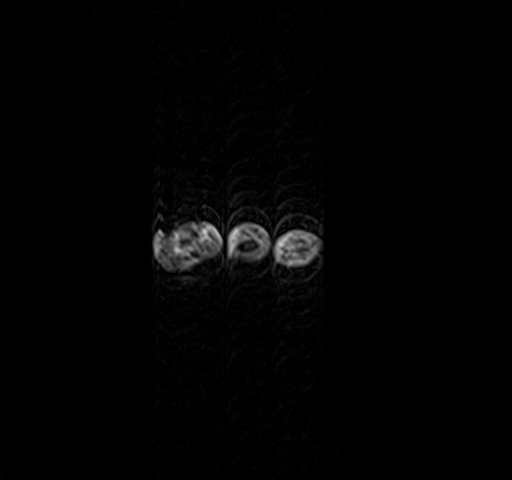
[im 16/40]
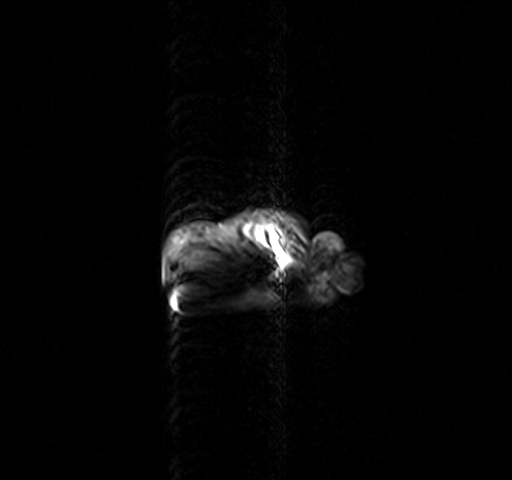
[im 24/40]
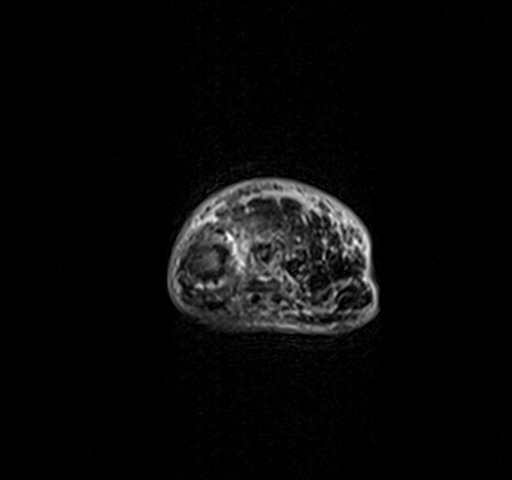
[im 32/40]
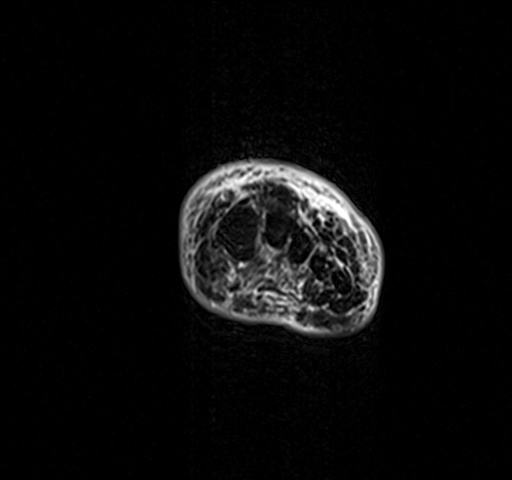
[im 40/40]
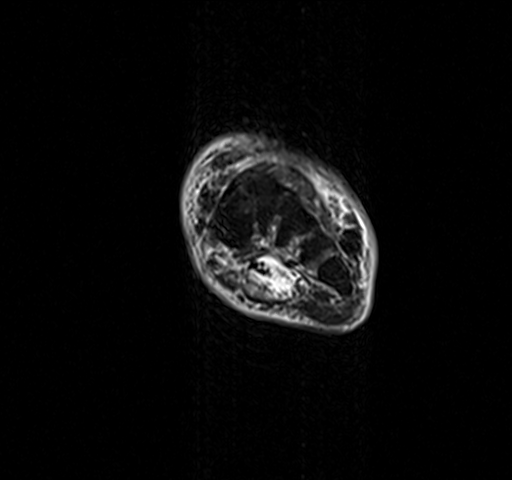

[Series 7: T1 · axial · 3.0mm · 0.78mm/px · z∈[-80,-4]mm · 3 of 25 slices shown (2 of 2)]
[im 1/25]
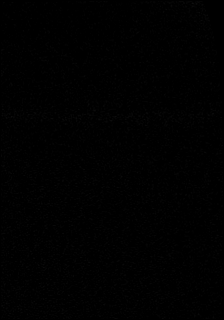
[im 13/25]
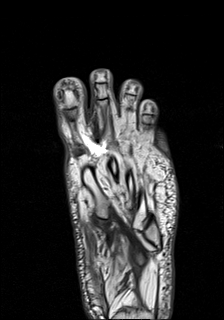
[im 25/25]
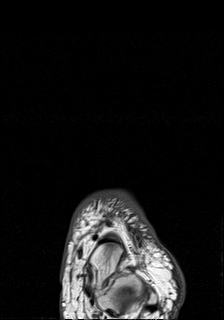

[Series 8: STIR · axial · 3.0mm · 0.49mm/px · z∈[-79,-3]mm · 3 of 25 slices shown (1 of 2)]
[im 1/25]
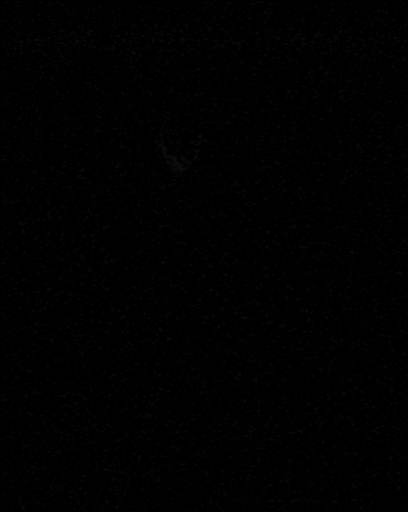
[im 13/25]
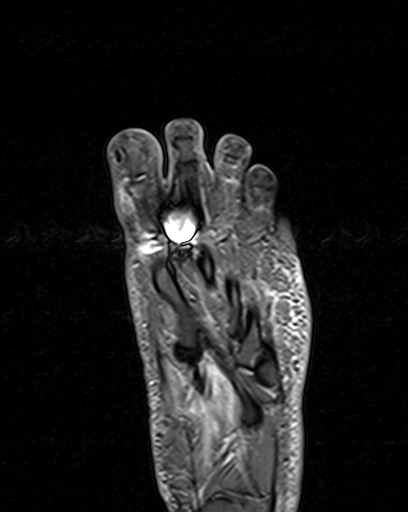
[im 25/25]
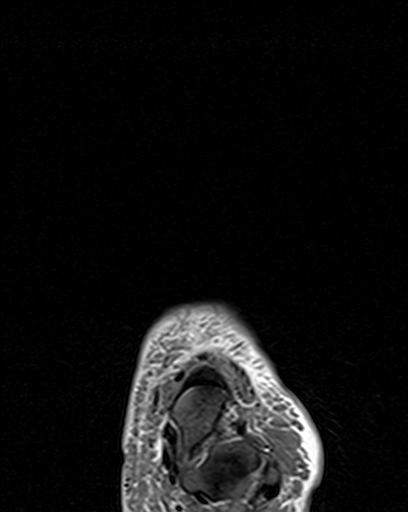

[Series 9: STIR · sagittal · 3.0mm · 0.49mm/px · 3 of 31 slices shown (2 of 2)]
[im 1/31]
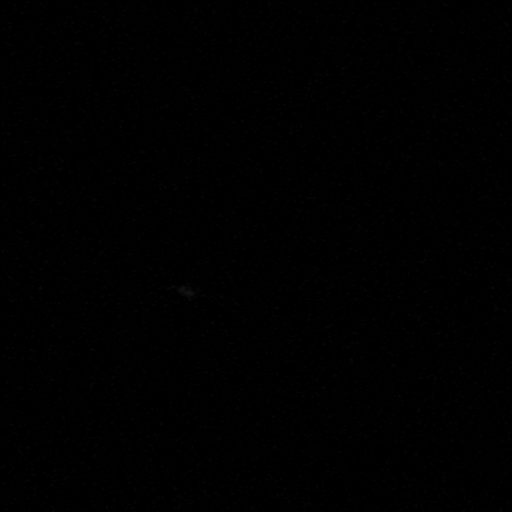
[im 11/31]
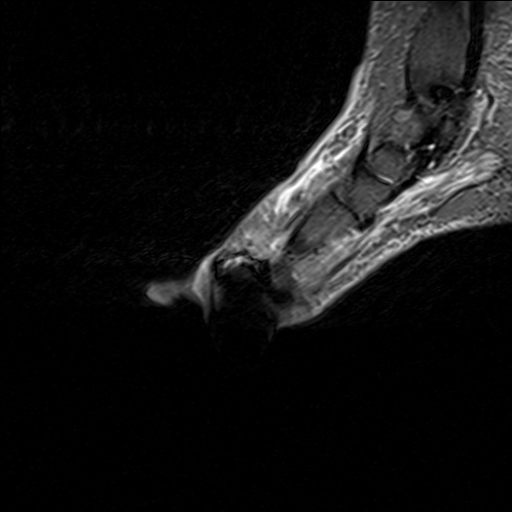
[im 21/31]
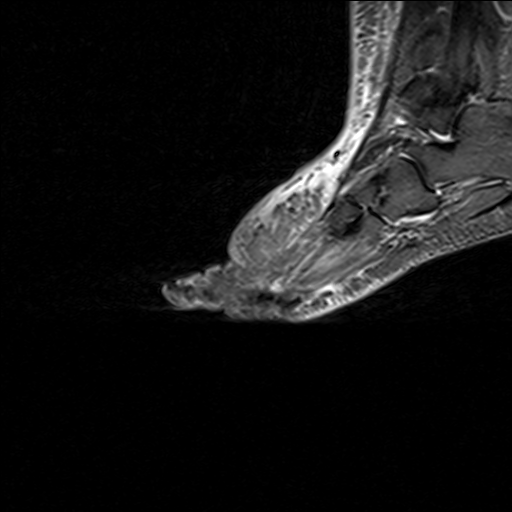

[Series 11: T1 fat-sat · coronal · 3.0mm · 0.75mm/px · 5 of 37 slices shown (1 of 3)]
[im 1/37]
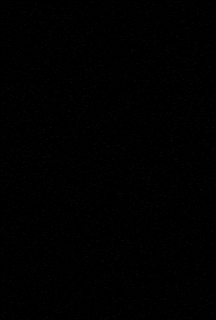
[im 10/37]
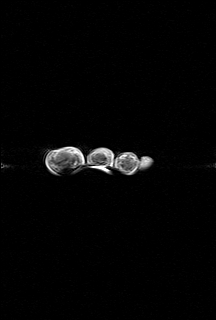
[im 19/37]
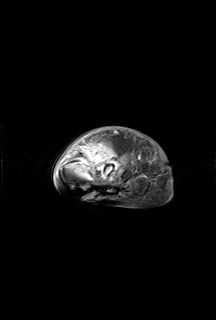
[im 28/37]
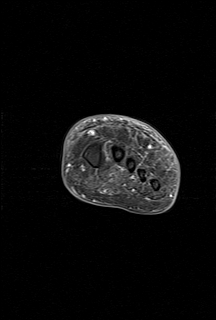
[im 37/37]
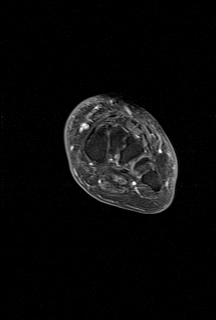

[Series 12: T1 fat-sat · axial · 3.0mm · 0.78mm/px · z∈[-79,-3]mm · 3 of 24 slices shown (2 of 3)]
[im 1/24]
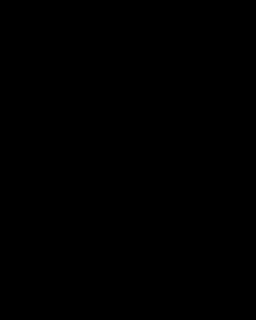
[im 12/24]
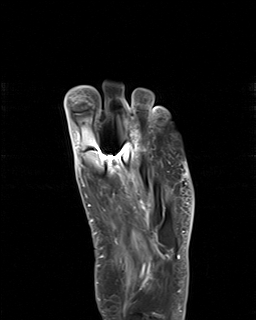
[im 24/24]
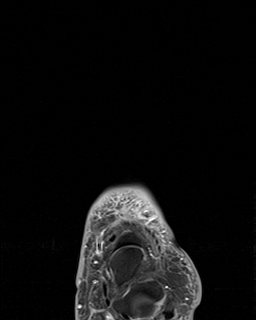

[Series 13: T1 fat-sat · sagittal · 3.0mm · 0.98mm/px · 4 of 30 slices shown (3 of 3)]
[im 1/30]
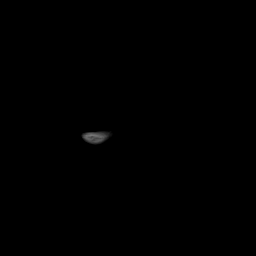
[im 10/30]
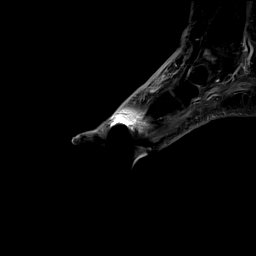
[im 20/30]
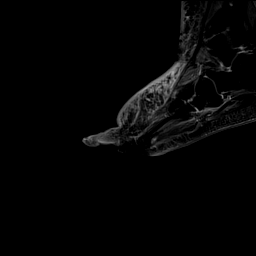
[im 30/30]
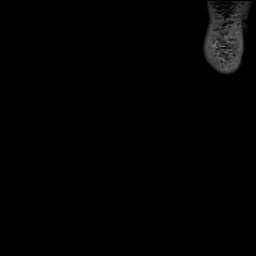

[39 of 40 positions shown; findings below may reference images not displayed]

FINDINGS: Bones/Joint/Cartilage

The metallic foreign body within the first web states creates
significant magnetic susceptibility artifact, limiting evaluation of
the first and second digits. Allowing for this artifact, there is no
abnormal marrow signal or enhancement. There is no evidence of
cortical destruction, acute fracture or dislocation. No significant
joint effusions or abnormal synovial enhancement demonstrated.

Ligaments

The Lisfranc ligament is intact.

Muscles and Tendons

There is diffuse fatty atrophy of the forefoot musculature, likely
due to diabetic myopathy. There is mild T2 hyperintensity along the
flexor tendons. No focal intramuscular fluid collections or
suspicious enhancement seen.

Soft tissues

There is dorsal subcutaneous edema. There is an ill-defined fluid
collection dorsal to the second and third metatarsal heads,
measuring up to 2.3 cm in greatest dimension. This demonstrates no
peripheral enhancement or overlying skin defect. No other focal
fluid collections are seen.
IMPRESSION: 1. Findings are consistent with dorsal forefoot cellulitis. There is
a nonspecific small fluid collection within the dorsal subcutaneous
fat without apparent overlying skin defect.
2. No evidence of deep abscess, osteomyelitis or septic joint.
3. Metallic foreign body within the first web space, creating
moderate susceptibility artifact.
